# Patient Record
Sex: Female | Born: 1970 | Race: Black or African American | Hispanic: No | Smoking: Never smoker
Health system: Southern US, Community
[De-identification: ages and names within clinical notes are randomized; demographics above are authoritative.]

## PROBLEM LIST (undated history)

## (undated) DIAGNOSIS — E785 Hyperlipidemia, unspecified: Secondary | ICD-10-CM

## (undated) DIAGNOSIS — N920 Excessive and frequent menstruation with regular cycle: Principal | ICD-10-CM

## (undated) DIAGNOSIS — G10 Huntington's disease: Secondary | ICD-10-CM

## (undated) DIAGNOSIS — G4730 Sleep apnea, unspecified: Secondary | ICD-10-CM

## (undated) DIAGNOSIS — F419 Anxiety disorder, unspecified: Secondary | ICD-10-CM

## (undated) DIAGNOSIS — G3184 Mild cognitive impairment, so stated: Secondary | ICD-10-CM

## (undated) DIAGNOSIS — D219 Benign neoplasm of connective and other soft tissue, unspecified: Secondary | ICD-10-CM

## (undated) DIAGNOSIS — J3089 Other allergic rhinitis: Secondary | ICD-10-CM

## (undated) DIAGNOSIS — I10 Essential (primary) hypertension: Secondary | ICD-10-CM

## (undated) DIAGNOSIS — E669 Obesity, unspecified: Secondary | ICD-10-CM

## (undated) HISTORY — DX: Obesity, unspecified: E66.9

## (undated) HISTORY — DX: Excessive and frequent menstruation with regular cycle: N92.0

## (undated) HISTORY — DX: Anxiety disorder, unspecified: F41.9

## (undated) HISTORY — DX: Benign neoplasm of connective and other soft tissue, unspecified: D21.9

## (undated) HISTORY — DX: Essential (primary) hypertension: I10

## (undated) HISTORY — DX: Other allergic rhinitis: J30.89

## (undated) HISTORY — DX: Mild cognitive impairment of uncertain or unknown etiology: G31.84

## (undated) HISTORY — DX: Hyperlipidemia, unspecified: E78.5

---

## 2002-07-28 ENCOUNTER — Emergency Department (HOSPITAL_COMMUNITY): Admission: EM | Admit: 2002-07-28 | Discharge: 2002-07-29 | Payer: Self-pay | Admitting: Emergency Medicine

## 2006-01-02 ENCOUNTER — Encounter: Payer: Self-pay | Admitting: Family Medicine

## 2006-01-02 ENCOUNTER — Ambulatory Visit: Payer: Self-pay | Admitting: Family Medicine

## 2006-01-02 ENCOUNTER — Other Ambulatory Visit: Admission: RE | Admit: 2006-01-02 | Discharge: 2006-01-02 | Payer: Self-pay | Admitting: Family Medicine

## 2006-11-08 ENCOUNTER — Ambulatory Visit: Payer: Self-pay | Admitting: Family Medicine

## 2006-11-14 ENCOUNTER — Encounter: Payer: Self-pay | Admitting: Family Medicine

## 2006-11-14 LAB — CONVERTED CEMR LAB
Blood Glucose, Fasting: 96 mg/dL
Cholesterol: 175 mg/dL (ref 0–200)
Eosinophils Absolute: 0.1 10*3/uL (ref 0.0–0.7)
Glucose, Bld: 96 mg/dL (ref 70–99)
HDL: 39 mg/dL — ABNORMAL LOW (ref >39)
Lymphs Abs: 2.6 10*3/uL (ref 0.7–3.3)
MCV: 82.8 fL (ref 78.0–100.0)
Neutrophils Relative %: 50 % (ref 43–77)
Platelets: 309 10*3/uL (ref 150–400)
Potassium: 4.5 meq/L (ref 3.5–5.3)
Sodium: 138 meq/L (ref 135–145)
Total CHOL/HDL Ratio: 4.5
Triglycerides: 147 mg/dL (ref <150)
VLDL: 29 mg/dL (ref 0–40)
WBC: 6.4 10*3/uL (ref 4.0–10.5)

## 2006-12-04 ENCOUNTER — Ambulatory Visit (HOSPITAL_COMMUNITY): Admission: RE | Admit: 2006-12-04 | Discharge: 2006-12-04 | Payer: Self-pay | Admitting: Neurology

## 2006-12-11 ENCOUNTER — Ambulatory Visit: Payer: Self-pay | Admitting: Family Medicine

## 2007-01-17 ENCOUNTER — Encounter: Payer: Self-pay | Admitting: Family Medicine

## 2007-01-17 ENCOUNTER — Ambulatory Visit: Payer: Self-pay | Admitting: Family Medicine

## 2007-01-17 ENCOUNTER — Other Ambulatory Visit: Admission: RE | Admit: 2007-01-17 | Discharge: 2007-01-17 | Payer: Self-pay | Admitting: Family Medicine

## 2007-01-17 LAB — CONVERTED CEMR LAB: Pap Smear: NORMAL

## 2007-03-22 ENCOUNTER — Ambulatory Visit: Payer: Self-pay | Admitting: Family Medicine

## 2007-04-26 ENCOUNTER — Ambulatory Visit: Payer: Self-pay | Admitting: Family Medicine

## 2007-07-19 ENCOUNTER — Encounter: Payer: Self-pay | Admitting: Family Medicine

## 2007-07-19 DIAGNOSIS — G319 Degenerative disease of nervous system, unspecified: Secondary | ICD-10-CM

## 2007-07-19 DIAGNOSIS — J301 Allergic rhinitis due to pollen: Secondary | ICD-10-CM

## 2007-07-19 DIAGNOSIS — E785 Hyperlipidemia, unspecified: Secondary | ICD-10-CM

## 2007-07-19 DIAGNOSIS — I10 Essential (primary) hypertension: Secondary | ICD-10-CM

## 2007-07-19 DIAGNOSIS — E663 Overweight: Secondary | ICD-10-CM

## 2007-08-31 ENCOUNTER — Ambulatory Visit: Payer: Self-pay | Admitting: Family Medicine

## 2007-09-10 ENCOUNTER — Ambulatory Visit: Payer: Self-pay | Admitting: Family Medicine

## 2007-09-14 ENCOUNTER — Ambulatory Visit (HOSPITAL_COMMUNITY): Admission: RE | Admit: 2007-09-14 | Discharge: 2007-09-14 | Payer: Self-pay | Admitting: Family Medicine

## 2008-01-04 ENCOUNTER — Ambulatory Visit: Payer: Self-pay | Admitting: Family Medicine

## 2008-01-04 LAB — CONVERTED CEMR LAB
AST: 16 units/L (ref 0–37)
Alkaline Phosphatase: 50 units/L (ref 39–117)
Bilirubin, Direct: 0.1 mg/dL (ref 0.0–0.3)
CO2: 23 meq/L (ref 19–32)
Calcium: 9.3 mg/dL (ref 8.4–10.5)
Creatinine, Ser: 0.78 mg/dL (ref 0.40–1.20)
Eosinophils Relative: 2 % (ref 0–5)
Glucose, Bld: 106 mg/dL — ABNORMAL HIGH (ref 70–99)
HCT: 38.5 % (ref 36.0–46.0)
Hemoglobin: 11.7 g/dL — ABNORMAL LOW (ref 12.0–15.0)
LDL Cholesterol: 124 mg/dL — ABNORMAL HIGH (ref 0–99)
Lymphocytes Relative: 38 % (ref 12–46)
Lymphs Abs: 2.5 10*3/uL (ref 0.7–4.0)
Monocytes Absolute: 0.4 10*3/uL (ref 0.1–1.0)
Monocytes Relative: 5 % (ref 3–12)
RBC: 4.66 M/uL (ref 3.87–5.11)
TSH: 3.458 microintl units/mL (ref 0.350–4.50)
Total Bilirubin: 0.4 mg/dL (ref 0.3–1.2)
Total CHOL/HDL Ratio: 5.5
WBC: 6.6 10*3/uL (ref 4.0–10.5)

## 2008-02-19 ENCOUNTER — Ambulatory Visit: Payer: Self-pay | Admitting: Family Medicine

## 2008-02-19 ENCOUNTER — Encounter: Payer: Self-pay | Admitting: Family Medicine

## 2008-02-19 ENCOUNTER — Other Ambulatory Visit: Admission: RE | Admit: 2008-02-19 | Discharge: 2008-02-19 | Payer: Self-pay | Admitting: Family Medicine

## 2008-02-19 DIAGNOSIS — H547 Unspecified visual loss: Secondary | ICD-10-CM

## 2008-02-29 ENCOUNTER — Encounter: Payer: Self-pay | Admitting: Family Medicine

## 2008-05-13 ENCOUNTER — Encounter: Payer: Self-pay | Admitting: Family Medicine

## 2008-05-20 ENCOUNTER — Ambulatory Visit: Payer: Self-pay | Admitting: Family Medicine

## 2008-07-07 ENCOUNTER — Encounter: Payer: Self-pay | Admitting: Family Medicine

## 2008-07-30 ENCOUNTER — Ambulatory Visit: Payer: Self-pay | Admitting: Family Medicine

## 2008-07-30 DIAGNOSIS — M25559 Pain in unspecified hip: Secondary | ICD-10-CM

## 2008-09-30 ENCOUNTER — Encounter: Payer: Self-pay | Admitting: Family Medicine

## 2008-10-02 ENCOUNTER — Encounter: Payer: Self-pay | Admitting: Family Medicine

## 2008-10-16 ENCOUNTER — Encounter: Payer: Self-pay | Admitting: Family Medicine

## 2008-10-28 ENCOUNTER — Ambulatory Visit: Payer: Self-pay | Admitting: Family Medicine

## 2008-11-03 ENCOUNTER — Encounter: Payer: Self-pay | Admitting: Family Medicine

## 2009-02-03 ENCOUNTER — Encounter: Payer: Self-pay | Admitting: Family Medicine

## 2009-02-27 ENCOUNTER — Encounter: Payer: Self-pay | Admitting: Family Medicine

## 2009-02-27 LAB — CONVERTED CEMR LAB
CO2: 26 meq/L (ref 19–32)
Chloride: 102 meq/L (ref 96–112)
Creatinine, Ser: 0.88 mg/dL (ref 0.40–1.20)
HDL: 33 mg/dL — ABNORMAL LOW (ref >39)
LDL Cholesterol: 98 mg/dL (ref 0–99)
Potassium: 4.3 meq/L (ref 3.5–5.3)
TSH: 3.933 microintl units/mL (ref 0.350–4.500)
VLDL: 30 mg/dL (ref 0–40)

## 2009-03-03 ENCOUNTER — Ambulatory Visit: Payer: Self-pay | Admitting: Family Medicine

## 2009-03-03 DIAGNOSIS — E119 Type 2 diabetes mellitus without complications: Secondary | ICD-10-CM

## 2009-03-15 DIAGNOSIS — G10 Huntington's disease: Secondary | ICD-10-CM

## 2009-03-30 ENCOUNTER — Encounter: Payer: Self-pay | Admitting: Family Medicine

## 2009-04-21 ENCOUNTER — Ambulatory Visit: Payer: Self-pay | Admitting: Family Medicine

## 2009-04-21 DIAGNOSIS — N3000 Acute cystitis without hematuria: Secondary | ICD-10-CM | POA: Insufficient documentation

## 2009-04-21 LAB — CONVERTED CEMR LAB
Glucose, Urine, Semiquant: NEGATIVE
Nitrite: NEGATIVE
Protein, U semiquant: 30
Urobilinogen, UA: 0.2
WBC Urine, dipstick: NEGATIVE
pH: 5

## 2009-04-22 ENCOUNTER — Encounter: Payer: Self-pay | Admitting: Family Medicine

## 2009-04-22 LAB — CONVERTED CEMR LAB: Microalb Creat Ratio: 10.7 mg/g (ref 0.0–30.0)

## 2009-06-03 ENCOUNTER — Ambulatory Visit: Payer: Self-pay | Admitting: Family Medicine

## 2009-06-03 LAB — CONVERTED CEMR LAB
Chloride: 98 meq/L (ref 96–112)
Glucose, Bld: 195 mg/dL
Hgb A1c MFr Bld: 6.6 %
Potassium: 3.9 meq/L (ref 3.5–5.3)
Sodium: 137 meq/L (ref 135–145)

## 2009-06-05 ENCOUNTER — Telehealth: Payer: Self-pay | Admitting: Family Medicine

## 2009-07-03 ENCOUNTER — Encounter: Payer: Self-pay | Admitting: Family Medicine

## 2009-07-10 ENCOUNTER — Encounter: Payer: Self-pay | Admitting: Family Medicine

## 2009-08-07 ENCOUNTER — Ambulatory Visit: Payer: Self-pay | Admitting: Family Medicine

## 2009-08-07 ENCOUNTER — Encounter: Payer: Self-pay | Admitting: Physician Assistant

## 2009-08-31 ENCOUNTER — Telehealth: Payer: Self-pay | Admitting: Physician Assistant

## 2009-09-03 ENCOUNTER — Ambulatory Visit: Payer: Self-pay | Admitting: Orthopedic Surgery

## 2009-09-03 DIAGNOSIS — M76899 Other specified enthesopathies of unspecified lower limb, excluding foot: Secondary | ICD-10-CM | POA: Insufficient documentation

## 2009-09-03 DIAGNOSIS — M545 Low back pain, unspecified: Secondary | ICD-10-CM | POA: Insufficient documentation

## 2009-09-08 ENCOUNTER — Ambulatory Visit: Payer: Self-pay | Admitting: Family Medicine

## 2009-09-08 DIAGNOSIS — M25569 Pain in unspecified knee: Secondary | ICD-10-CM

## 2009-09-08 DIAGNOSIS — R51 Headache: Secondary | ICD-10-CM

## 2009-09-08 DIAGNOSIS — R519 Headache, unspecified: Secondary | ICD-10-CM | POA: Insufficient documentation

## 2009-09-14 ENCOUNTER — Encounter: Payer: Self-pay | Admitting: Physician Assistant

## 2009-09-14 LAB — CONVERTED CEMR LAB
Alkaline Phosphatase: 40 units/L (ref 39–117)
BUN: 15 mg/dL (ref 6–23)
CO2: 26 meq/L (ref 19–32)
Cholesterol: 176 mg/dL (ref 0–200)
Glucose, Bld: 94 mg/dL (ref 70–99)
HCT: 32.3 % — ABNORMAL LOW (ref 36.0–46.0)
HDL: 30 mg/dL — ABNORMAL LOW (ref >39)
Hemoglobin: 10.2 g/dL — ABNORMAL LOW (ref 12.0–15.0)
LDL Cholesterol: 118 mg/dL — ABNORMAL HIGH (ref 0–99)
MCHC: 31.6 g/dL (ref 30.0–36.0)
MCV: 83.9 fL (ref 78.0–100.0)
RBC: 3.85 M/uL — ABNORMAL LOW (ref 3.87–5.11)
Sodium: 137 meq/L (ref 135–145)
Total Bilirubin: 0.3 mg/dL (ref 0.3–1.2)
Total Protein: 7 g/dL (ref 6.0–8.3)
Triglycerides: 139 mg/dL (ref <150)
VLDL: 28 mg/dL (ref 0–40)
Vit D, 25-Hydroxy: 32 ng/mL (ref 30–89)
WBC: 8 10*3/uL (ref 4.0–10.5)

## 2009-09-15 LAB — CONVERTED CEMR LAB
Free T4: 1.11 ng/dL (ref 0.80–1.80)
Iron: 32 ug/dL — ABNORMAL LOW (ref 42–145)
Saturation Ratios: 10 % — ABNORMAL LOW (ref 20–55)
TIBC: 330 ug/dL (ref 250–470)

## 2009-09-16 ENCOUNTER — Encounter: Payer: Self-pay | Admitting: Orthopedic Surgery

## 2009-09-23 ENCOUNTER — Encounter (HOSPITAL_COMMUNITY): Admission: RE | Admit: 2009-09-23 | Discharge: 2009-10-23 | Payer: Self-pay | Admitting: Orthopedic Surgery

## 2009-10-16 LAB — CONVERTED CEMR LAB
Basophils Absolute: 0 10*3/uL (ref 0.0–0.1)
Basophils Relative: 1 % (ref 0–1)
Eosinophils Absolute: 0.6 10*3/uL (ref 0.0–0.7)
Eosinophils Relative: 9 % — ABNORMAL HIGH (ref 0–5)
HCT: 32.6 % — ABNORMAL LOW (ref 36.0–46.0)
MCV: 84.7 fL (ref 78.0–100.0)
Neutrophils Relative %: 47 % (ref 43–77)
Platelets: 354 10*3/uL (ref 150–400)
RDW: 14.7 % (ref 11.5–15.5)

## 2009-10-21 ENCOUNTER — Encounter: Payer: Self-pay | Admitting: Orthopedic Surgery

## 2009-10-26 ENCOUNTER — Encounter (HOSPITAL_COMMUNITY): Admission: RE | Admit: 2009-10-26 | Discharge: 2009-11-25 | Payer: Self-pay | Admitting: Orthopedic Surgery

## 2009-10-30 ENCOUNTER — Encounter: Payer: Self-pay | Admitting: Orthopedic Surgery

## 2009-11-02 ENCOUNTER — Telehealth: Payer: Self-pay | Admitting: Physician Assistant

## 2009-11-23 ENCOUNTER — Telehealth: Payer: Self-pay | Admitting: Physician Assistant

## 2009-12-15 ENCOUNTER — Ambulatory Visit: Admission: RE | Admit: 2009-12-15 | Discharge: 2009-12-15 | Payer: Self-pay | Admitting: Neurology

## 2009-12-25 ENCOUNTER — Encounter: Payer: Self-pay | Admitting: Family Medicine

## 2010-01-11 ENCOUNTER — Ambulatory Visit: Payer: Self-pay | Admitting: Family Medicine

## 2010-01-11 DIAGNOSIS — D509 Iron deficiency anemia, unspecified: Secondary | ICD-10-CM

## 2010-01-11 DIAGNOSIS — R5383 Other fatigue: Secondary | ICD-10-CM

## 2010-01-11 DIAGNOSIS — R5381 Other malaise: Secondary | ICD-10-CM

## 2010-01-11 DIAGNOSIS — L259 Unspecified contact dermatitis, unspecified cause: Secondary | ICD-10-CM | POA: Insufficient documentation

## 2010-01-12 LAB — CONVERTED CEMR LAB
Basophils Absolute: 0 10*3/uL (ref 0.0–0.1)
Basophils Relative: 1 % (ref 0–1)
Eosinophils Absolute: 0.5 10*3/uL (ref 0.0–0.7)
Ferritin: 14 ng/mL (ref 10–291)
Iron: 30 ug/dL — ABNORMAL LOW (ref 42–145)
MCHC: 30.8 g/dL (ref 30.0–36.0)
MCV: 85.6 fL (ref 78.0–100.0)
Monocytes Relative: 5 % (ref 3–12)
Neutrophils Relative %: 52 % (ref 43–77)
Platelets: 333 10*3/uL (ref 150–400)
RDW: 14.3 % (ref 11.5–15.5)

## 2010-01-14 ENCOUNTER — Telehealth: Payer: Self-pay | Admitting: Family Medicine

## 2010-01-20 ENCOUNTER — Encounter: Payer: Self-pay | Admitting: Family Medicine

## 2010-03-04 ENCOUNTER — Ambulatory Visit: Payer: Self-pay | Admitting: Family Medicine

## 2010-03-04 ENCOUNTER — Other Ambulatory Visit: Admission: RE | Admit: 2010-03-04 | Discharge: 2010-03-04 | Payer: Self-pay | Admitting: Family Medicine

## 2010-03-04 LAB — HM DIABETES FOOT EXAM

## 2010-03-04 LAB — HM PAP SMEAR

## 2010-03-09 ENCOUNTER — Encounter: Payer: Self-pay | Admitting: Family Medicine

## 2010-04-15 ENCOUNTER — Encounter: Payer: Self-pay | Admitting: Family Medicine

## 2010-05-05 ENCOUNTER — Ambulatory Visit
Admission: RE | Admit: 2010-05-05 | Discharge: 2010-05-05 | Payer: Self-pay | Source: Home / Self Care | Admitting: Psychiatry

## 2010-05-12 ENCOUNTER — Encounter: Payer: Self-pay | Admitting: Family Medicine

## 2010-06-02 ENCOUNTER — Ambulatory Visit: Payer: Self-pay | Admitting: Family Medicine

## 2010-06-02 LAB — CONVERTED CEMR LAB
BUN: 11 mg/dL (ref 6–23)
Cholesterol: 192 mg/dL (ref 0–200)
Creatinine, Ser: 1.02 mg/dL (ref 0.40–1.20)
Glucose, Bld: 83 mg/dL (ref 70–99)
Potassium: 4.4 meq/L (ref 3.5–5.3)
VLDL: 26 mg/dL (ref 0–40)

## 2010-06-04 ENCOUNTER — Telehealth: Payer: Self-pay | Admitting: Family Medicine

## 2010-07-02 ENCOUNTER — Ambulatory Visit (HOSPITAL_BASED_OUTPATIENT_CLINIC_OR_DEPARTMENT_OTHER)
Admission: RE | Admit: 2010-07-02 | Discharge: 2010-07-02 | Payer: Self-pay | Source: Home / Self Care | Attending: Internal Medicine | Admitting: Internal Medicine

## 2010-07-11 ENCOUNTER — Encounter: Payer: Self-pay | Admitting: Family Medicine

## 2010-07-12 ENCOUNTER — Encounter: Payer: Self-pay | Admitting: Family Medicine

## 2010-07-22 NOTE — Letter (Signed)
Summary: Historic Patient File  Historic Patient File   Imported By: Elvera Maria 09/04/2009 10:44:19  _____________________________________________________________________  External Attachment:    Type:   Image     Comment:   history form

## 2010-07-22 NOTE — Letter (Signed)
Summary: medical records  medical records   Imported By: Lind Guest 07/12/2010 08:39:49  _____________________________________________________________________  External Attachment:    Type:   Image     Comment:   External Document

## 2010-07-22 NOTE — Miscellaneous (Signed)
Summary: PT Clinical evaluation  PT Clinical evaluation   Imported By: Jacklynn Ganong 10/05/2009 14:07:28  _____________________________________________________________________  External Attachment:    Type:   Image     Comment:   External Document

## 2010-07-22 NOTE — Letter (Signed)
Summary: Letter  Letter   Imported By: Lind Guest 03/09/2010 11:26:00  _____________________________________________________________________  External Attachment:    Type:   Image     Comment:   External Document

## 2010-07-22 NOTE — Progress Notes (Signed)
  Phone Note Call from Patient   Summary of Call: Patient requesting refill on tramadol. [facility]. Last fill on 5/16 #30 1 q 6 hrs as needed  Initial call taken by: Everitt Amber LPN,  November 23, 9809 2:39 PM  Follow-up for Phone Call        Pt is suppose to be taking Naprosyn two times a day for her hip pain. If she is still having problems she needs to f/u with Dr Romeo Apple. Follow-up by: Esperanza Sheets PA,  November 23, 2009 2:53 PM  Additional Follow-up for Phone Call Additional follow up Details #1::        Called patient, left message Additional Follow-up by: Everitt Amber LPN,  November 23, 9145 4:25 PM    Additional Follow-up for Phone Call Additional follow up Details #2::    called pt, no answer Follow-up by: Everitt Amber LPN,  November 25, 8293 1:16 PM

## 2010-07-22 NOTE — Assessment & Plan Note (Signed)
Summary: office visit   Vital Signs:  Patient profile:   40 year old female Menstrual status:  regular Height:      65 inches Weight:      175.25 pounds BMI:     29.27 O2 Sat:      96 % Pulse rate:   99 / minute Pulse rhythm:   regular Resp:     16 per minute BP sitting:   100 / 76  (left arm) Cuff size:   large  Vitals Entered By: Everitt Amber LPN (January 11, 2010 9:35 AM)  Nutrition Counseling: Patient's BMI is greater than 25 and therefore counseled on weight management options. CC: nose has been running, has been having headaches   Primary Care Provider:  margaret simpson, md  CC:  nose has been running and has been having headaches.  History of Present Illness: Reports  that she has been doing fairly well. Denies recent fever or chills. Denies  ear pain or sore throat.She does report nasal congestion with clear nasal drainage and frontal headaches inrtermittently. Denies chest congestion, or cough productive of sputum. Denies chest pain, palpitations, PND, orthopnea or leg swelling. Denies abdominal pain, nausea, vomitting, diarrhea or constipation. Denies change in bowel movements or bloody stool. Denies dysuria , frequency, incontinence or hesitancy. Denies  joint swelling, or reduced mobility. Denies  vertigo, seizures. Denies uncontrolled depression, anxiety or insomnia.Meds control these symptoms Denies  rash, lesions, or itch.  Pt exercises every day, dancing, and she has her blood sugar log , she tests daily, her mom does this, her fasting sugars are bertween 90 to 120   Current Medications (verified): 1)  Ibuprofen 800 Mg  Tabs (Ibuprofen) .... Take 1 Tablet By Mouth Three Times A Day As Needed 2)  Cetirizine Hcl 10 Mg  Tabs (Cetirizine Hcl) .... Take 1 Tablet By Mouth Once A Day 3)  Hydrochlorothiazide 25 Mg Tabs (Hydrochlorothiazide) .... Take 1 Tablet By Mouth Once A Day 4)  Klor-Con M20 20 Meq Cr-Tabs (Potassium Chloride Crys Cr) .... Take 1 Tablet By  Mouth Once A Day 5)  Flonase 50 Mcg/act Susp (Fluticasone Propionate) .... 2 Puffs Per Nostril Daily 6)  Namenda 10 Mg Tabs (Memantine Hcl) .... Take 1 Tablet By Mouth Two Times A Day 7)  Citalopram Hydrobromide 20 Mg Tabs (Citalopram Hydrobromide) .... Take 1 Tablet By Mouth Once A Day 8)  Prodigy Meter .... Once Daily Testing 9)  Benazepril Hcl 10 Mg Tabs (Benazepril Hcl) .... Take 1 Tablet By Mouth Once A Day 10)  Galantamine Hydrobromide 24 Mg Xr24h-Cap (Galantamine Hydrobromide) .... One Tab By Mouth Qd 11)  Metformin Hcl 500 Mg Tabs (Metformin Hcl) .... Two Tablets Twice Daily 12)  Ferrous Sulfate 324 Mg Tbec (Ferrous Sulfate) .... Take 1 Tablet By Mouth Once A Day 13)  Naprosyn 500 Mg Tabs (Naproxen) .... Take 1 Tablet By Mouth Two Times A Day 14)  Seroquel 50 Mg Tabs (Quetiapine Fumarate) .... Take 1 Tab By Mouth At Bedtime 15)  Clotrimazole-Betamethasone 1-0.05 % Crea (Clotrimazole-Betamethasone) .... Apply To Affected Area Two Times A Day  Allergies (verified): 1)  ! Benadryl  Review of Systems      See HPI General:  Complains of fatigue. Eyes:  Denies discharge, eye pain, and red eye. ENT:  Complains of nasal congestion and postnasal drainage. MS:  Complains of joint pain; intermittent bilateral hip p[aion. Derm:  Complains of itching and lesion(s); several month h/o hyperpigmented lesions on both feet. Neuro:  Complains of  memory loss. Psych:  Complains of anxiety and depression; denies suicidal thoughts/plans, thoughts of violence, and unusual visions or sounds. Endo:  Denies cold intolerance, excessive hunger, excessive thirst, excessive urination, heat intolerance, polyuria, and weight change. Heme:  Denies abnormal bruising and bleeding. Allergy:  Complains of seasonal allergies.  Physical Exam  General:  Well-developed,well-nourished,in no acute distress; alert,appropriate and cooperative throughout examination HEENT: No facial asymmetry,  EOMI, No sinus tenderness,  TM's Clear, oropharynx  pink and moist.   Chest: Clear to auscultation bilaterally.  CVS: S1, S2, No murmurs, No S3.   Abd: Soft, Nontender.  MS: Adequate ROM spine, hips, shoulders and knees.  Ext: No edema.   CNS: CN 2-12 intact, power tone and sensation normal throughout.   Skin: Intact, hyperpigmented lesions on both lower ext,no erythema , warmth or drainage  Psych: Good eye contact, flat affect.  Memory losst, not anxious or depressed appearing.    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 Take 1 tablet by mouth once a day    Metformin Hcl 500 Mg Tabs (Metformin hcl) .Marland Kitchen..Marland Kitchen Two tablets twice daily  Orders: T- Hemoglobin A1C ([id number])  Labs Reviewed: Creat: 1.29 (09/10/2009)    Reviewed HgBA1c results: 6.5 (09/10/2009)  6.6 (06/03/2009)  Problem # 3:  HEADACHE (ICD-784.0) Assessment: Unchanged  The following medications were removed from the medication list:    Ibuprofen 800 Mg Tabs (Ibuprofen) .Marland Kitchen... Take 1 tablet by mouth three times a day as needed    Tramadol Hcl 50 Mg Tabs (Tramadol hcl) .Marland Kitchen... 1 q 6 hrs as needed pain Her updated medication list for this problem includes:    Naprosyn 500 Mg Tabs (Naproxen) .Marland Kitchen... Take 1 tablet by mouth two times a day  Problem # 4:  ESSENTIAL HYPERTENSION, BENIGN (ICD-401.1) Assessment: Unchanged  The following medications were removed from the medication list:    Hydrochlorothiazide 25 Mg Tabs (Hydrochlorothiazide) .Marland Kitchen... Take 1 tablet by mouth once a day Her updated medication list for this problem includes:    Benazepril Hcl 10 Mg Tabs (Benazepril hcl) .Marland Kitchen... Take 1 tablet by mouth once a day  BP today: 100/76 Prior BP: 102/68 (09/08/2009)  Labs  Reviewed: K+: 4.1 (09/10/2009) Creat: : 1.29 (09/10/2009)   Chol: 176 (09/10/2009)   HDL: 30 (09/10/2009)   LDL: 118 (09/10/2009)   TG: 139 (09/10/2009)  Problem # 5:  DERMATITIS (ICD-692.9) Assessment: Comment Only  The following medications were removed from the medication list:    Clobex 0.05 % Sham (Clobetasol propionate) ..... Use once per month to shampoo hair Her updated medication list for this problem includes:    Cetirizine Hcl 10 Mg Tabs (Cetirizine hcl) .Marland Kitchen... Take 1 tablet by mouth once a day  Orders: Dermatology Referral (Derma)  Complete Medication List: 1)  Cetirizine Hcl 10 Mg Tabs (Cetirizine hcl) .... Take 1 tablet by mouth once a day 2)  Flonase 50 Mcg/act Susp (Fluticasone propionate) .... 2 puffs per nostril daily 3)  Namenda 10 Mg Tabs (Memantine hcl) .... Take 1 tablet by mouth two times a day 4)  Citalopram Hydrobromide 20 Mg Tabs (Citalopram hydrobromide) .... Take 1 tablet by mouth once a day 5)  Prodigy Meter  .... Once daily testing 6)  Benazepril Hcl 10 Mg Tabs (  Benazepril hcl) .... Take 1 tablet by mouth once a day 7)  Galantamine Hydrobromide 24 Mg Xr24h-cap (Galantamine hydrobromide) .... One tab by mouth qd 8)  Metformin Hcl 500 Mg Tabs (Metformin hcl) .... Two tablets twice daily 9)  Ferrous Sulfate 324 Mg Tbec (Ferrous sulfate) .... Take 1 tablet by mouth once a day 10)  Naprosyn 500 Mg Tabs (Naproxen) .... Take 1 tablet by mouth two times a day 11)  Seroquel 50 Mg Tabs (Quetiapine fumarate) .... Take 1 tab by mouth at bedtime 12)  Clotrimazole-betamethasone 1-0.05 % Crea (Clotrimazole-betamethasone) .... Apply to affected area two times a day 13)  Multivitamins Tabs (Multiple vitamin) .... Take 1 tablet by mouth once a day 14)  Fish Oil 1000 Mg Caps (Omega-3 fatty acids) .... Take 1 tablet by mouth two times a day  Other Orders: T-CBC w/Diff ([id number]) T-Iron ([id number]) T-Ferritin ([id number])  Patient Instructions: 1)  CPE in 5 to 7  weeks 2)  It is important that you exercise regularly at least 20 minutes 5 times a week. If you develop chest pain, have severe difficulty breathing, or feel very tired , stop exercising immediately and seek medical attention. 3)  Check your blood sugars regularly. If your readings are usually above : or below 70 you should contact our office. 4)  It is important that you exercise regularly at least 20 minutes 5 times a week. If you develop chest pain, have severe difficulty breathing, or feel very tired , stop exercising immediately and seek medical attention. 5)  You need to lose weight. Consider a lower calorie diet and regular exercise.  6)  HbgA1C prior to visit, ICD-9:cbc , uiron and ferritin today if possible 7)  plsd only take the pain meds when you are hurting,not every day as this may be a part of the headache 8)  you will be referred to the skin specialist, also to [facility] program. 9)  pls discuss the headacheswith your neurologistt  10)  ppls stop hctz and potassium, your bP is low

## 2010-07-22 NOTE — Assessment & Plan Note (Signed)
Summary: follow up - room 1   Vital Signs:  Patient profile:   40 year old female Menstrual status:  regular Height:      65 inches Weight:      181.25 pounds BMI:     30.27 O2 Sat:      97 % on Room air Pulse rate:   82 / minute Resp:     16 per minute BP sitting:   102 / 68  (left arm)  Vitals Entered By: Adella Hare LPN (September 08, 2009 9:50 AM)  Nutrition Counseling: Patient's BMI is greater than 25 and therefore counseled on weight management options. CC: follow-up visit- also complains of right knee pain and headache Is Patient Diabetic? Yes   Referring Provider:  Dr. Lodema Hong Primary Provider:  margaret simpson, md  CC:  follow-up visit- also complains of right knee pain and headache.  History of Present Illness: Pt is here today with her mother for check up. She recetnly saw Dr Romeo Apple re: hip & knee pain.  X-rays were neg.  He changd her ibuprofen to Naprosyn & ordered PT.  She has not started PT yet.  Rt sided HA's x months.  Every night.  No change.  Ibuprofen is no help. Has Neurology appt Thurs.  Hx of DM. Blood sugars at home 100-110 fasting.  No episodes of hypoglycemia. Eye exam 1 wk ago at [facility]. Immun UTD  Also hx of HTN.  Taking meds.  Doesn't check BP at home.   Current Medications (verified): 1)  Ibuprofen 800 Mg  Tabs (Ibuprofen) .... Take 1 Tablet By Mouth Three Times A Day As Needed 2)  Cetirizine Hcl 10 Mg  Tabs (Cetirizine Hcl) .... Take 1 Tablet By Mouth Once A Day 3)  Hydrochlorothiazide 25 Mg Tabs (Hydrochlorothiazide) .... Take 1 Tablet By Mouth Once A Day 4)  Klor-Con M20 20 Meq Cr-Tabs (Potassium Chloride Crys Cr) .... Take 1 Tablet By Mouth Once A Day 5)  Astepro 137 Mcg/spray Soln (Azelastine Hcl) .... 2 Puffs Per Nostril Twice Daily 6)  Flonase 50 Mcg/act Susp (Fluticasone Propionate) .... 2 Puffs Per Nostril Daily 7)  Lunesta 1 Mg Tabs (Eszopiclone) .... Take 1-2 Tabs At Bedtime 8)  Namenda 10 Mg Tabs (Memantine Hcl) .... Take  1 Tablet By Mouth Two Times A Day 9)  Citalopram Hydrobromide 20 Mg Tabs (Citalopram Hydrobromide) .... Take 1 Tablet By Mouth Once A Day 10)  Prodigy Meter .... Once Daily Testing 11)  Benazepril Hcl 10 Mg Tabs (Benazepril Hcl) .... Take 1 Tablet By Mouth Once A Day 12)  Galantamine Hydrobromide 24 Mg Xr24h-Cap (Galantamine Hydrobromide) .... One Tab By Mouth Qd 13)  Metformin Hcl 500 Mg Tabs (Metformin Hcl) .... Two Tablets Twice Daily 14)  Clotrimazole-Betamethasone 1-0.05 % Crea (Clotrimazole-Betamethasone) .... Apply Twice Daily To Affected Areas 15)  Clobex 0.05 % Sham (Clobetasol Propionate) .... Use Once Per Month To Shampoo Hair 16)  Tramadol Hcl 50 Mg Tabs (Tramadol Hcl) .Marland Kitchen.. 1 Q 6 Hrs As Needed Pain  Allergies (verified): 1)  ! Benadryl  Past History:  Past medical history reviewed for relevance to current acute and chronic problems.  Past Medical History: Reviewed history from 09/03/2009 and no changes required. Hypertension Obesity Dyslipidemia Mild Cognitive Impairment Perennial Allergies, uncontrolled diabetes anxiety  Review of Systems General:  Denies chills and fever. Eyes:  Denies blurring and double vision. CV:  Denies chest pain or discomfort and palpitations. Resp:  Denies cough and shortness of breath. GI:  Denies nausea and vomiting. MS:  Complains of joint pain. Neuro:  Complains of headaches; denies numbness and tingling.  Physical Exam  General:  alert, well-developed, well-nourished, and well-hydrated.   Head:  Normocephalic and atraumatic without obvious abnormalities. No apparent alopecia or balding. Ears:  External ear exam shows no significant lesions or deformities.  Otoscopic examination reveals clear canals, tympanic membranes are intact bilaterally without bulging, retraction, inflammation or discharge. Hearing is grossly normal bilaterally. Nose:  External nasal examination shows no deformity or inflammation. Nasal mucosa are pink and  moist without lesions or exudates. Mouth:  Oral mucosa and oropharynx without lesions or exudates.   Neck:  No deformities, masses, or tenderness noted. Lungs:  Normal respiratory effort, chest expands symmetrically. Lungs are clear to auscultation, no crackles or wheezes. Heart:  Normal rate and regular rhythm. S1 and S2 normal without gallop, murmur, click, rub or other extra sounds. Abdomen:  Bowel sounds positive,abdomen soft and non-tender without masses, organomegaly or hernias noted. Msk:  Rt knee normal ROM, no joint tenderness, no joint swelling, no joint instability, and no crepitation.   Neurologic:  gait normal and DTRs symmetrical and normal.     Impression & Recommendations:  Problem # 1:  DIABETES MELLITUS (ICD-250.00) Assessment Improved  Her updated medication list for this problem includes:    Benazepril Hcl 10 Mg Tabs (Benazepril hcl) .Marland Kitchen... Take 1 tablet by mouth once a day    Metformin Hcl 500 Mg Tabs (Metformin hcl) .Marland Kitchen..Marland Kitchen Two tablets twice daily  Orders: T-Comprehensive Metabolic Panel [id number]) T- Hemoglobin A1C [phone])  Labs Reviewed: Creat: 0.96 (06/03/2009)    Reviewed HgBA1c results: 6.6 (06/03/2009)  7.2 (03/03/2009)  Problem # 2:  ESSENTIAL HYPERTENSION, BENIGN (ICD-401.1) Assessment: Unchanged  Her updated medication list for this problem includes:    Hydrochlorothiazide 25 Mg Tabs (Hydrochlorothiazide) .Marland Kitchen... Take 1 tablet by mouth once a day    Benazepril Hcl 10 Mg Tabs (Benazepril hcl) .Marland Kitchen... Take 1 tablet by mouth once a day  Orders: T-Comprehensive Metabolic Panel ([id number])  BP today: 102/68 Prior BP: 105/70 (08/07/2009)  Labs Reviewed: K+: 3.9 (06/03/2009) Creat: : 0.96 (06/03/2009)   Chol: 161 (02/27/2009)   HDL: 33 (02/27/2009)   LDL: 98 (02/27/2009)   TG: 148 (02/27/2009)  Problem # 3:  HEADACHE (ICD-784.0) Assessment: Comment Only Has appt with Neurology this week. Her updated medication list for this problem  includes:    Ibuprofen 800 Mg Tabs (Ibuprofen) .Marland Kitchen... Take 1 tablet by mouth three times a day as needed    Tramadol Hcl 50 Mg Tabs (Tramadol hcl) .Marland Kitchen... 1 q 6 hrs as needed pain  Problem # 4:  KNEE PAIN, RIGHT (ICD-719.46) Assessment: Comment Only  Her updated medication list for this problem includes:    Ibuprofen 800 Mg Tabs (Ibuprofen) .Marland Kitchen... Take 1 tablet by mouth three times a day as needed    Tramadol Hcl 50 Mg Tabs (Tramadol hcl) .Marland Kitchen... 1 q 6 hrs as needed pain  Complete Medication List: 1)  Ibuprofen 800 Mg Tabs (Ibuprofen) .... Take 1 tablet by mouth three times a day as needed 2)  Cetirizine Hcl 10 Mg Tabs (Cetirizine hcl) .... Take 1 tablet by mouth once a day 3)  Hydrochlorothiazide 25 Mg Tabs (Hydrochlorothiazide) .... Take 1 tablet by mouth once a day 4)  Klor-con M20 20 Meq Cr-tabs (Potassium chloride crys cr) .... Take 1 tablet by mouth once a day 5)  Astepro 137 Mcg/spray Soln (Azelastine hcl) .... 2 puffs per nostril twice daily  6)  Flonase 50 Mcg/act Susp (Fluticasone propionate) .... 2 puffs per nostril daily 7)  Lunesta 1 Mg Tabs (Eszopiclone) .... Take 1-2 tabs at bedtime 8)  Namenda 10 Mg Tabs (Memantine hcl) .... Take 1 tablet by mouth two times a day 9)  Citalopram Hydrobromide 20 Mg Tabs (Citalopram hydrobromide) .... Take 1 tablet by mouth once a day 10)  Prodigy Meter  .... Once daily testing 11)  Benazepril Hcl 10 Mg Tabs (Benazepril hcl) .... Take 1 tablet by mouth once a day 12)  Galantamine Hydrobromide 24 Mg Xr24h-cap (Galantamine hydrobromide) .... One tab by mouth qd 13)  Metformin Hcl 500 Mg Tabs (Metformin hcl) .... Two tablets twice daily 14)  Clotrimazole-betamethasone 1-0.05 % Crea (Clotrimazole-betamethasone) .... Apply twice daily to affected areas 15)  Clobex 0.05 % Sham (Clobetasol propionate) .... Use once per month to shampoo hair 16)  Tramadol Hcl 50 Mg Tabs (Tramadol hcl) .Marland Kitchen.. 1 q 6 hrs as needed pain  Other Orders: T-Lipid Profile  ([id number]) T-CBC No Diff ([id number]) T-TSH ([id number]) T-Vitamin D (25-Hydroxy) ([id number])  Patient Instructions: 1)  Please schedule a follow-up appointment in 4 months. 2)  It is important that you exercise regularly at least 20 minutes 5 times a week. If you develop chest pain, have severe difficulty breathing, or feel very tired , stop exercising immediately and seek medical attention. 3)  You need to lose weight. Consider a lower calorie diet and regular exercise.  4)  Keep appt with Neurologist this week to discuss your headaches. 5)  Continue medications as recommended by Dr. Romeo Apple.  Start physical therapy as he recommended too. 6)  I have ordered lab work.  This needs to be drawn fasting. Prescriptions: KLOR-CON M20 20 MEQ CR-TABS (POTASSIUM CHLORIDE CRYS CR) Take 1 tablet by mouth once a day  #30 x 4   Entered and Authorized by:   Esperanza Sheets PA   Signed by:   Esperanza Sheets PA on 09/08/2009   Method used:   Electronically to        [facility] (retail)       [address]       [place], [place]  [address]       Ph: [phone] or [phone]       Fax: [phone]   RxID:   [id number]

## 2010-07-22 NOTE — Progress Notes (Signed)
Summary: eye exam  eye exam   Imported By: Lind Guest 07/10/2009 10:59:31  _____________________________________________________________________  External Attachment:    Type:   Image     Comment:   External Document

## 2010-07-22 NOTE — Assessment & Plan Note (Signed)
Summary: FOLLOW UP/SLJ   Vital Signs:  Patient profile:   40 year old female Menstrual status:  regular Height:      65 inches Weight:      175.25 pounds BMI:     29.27 O2 Sat:      98 % on Room air Pulse rate:   70 / minute Pulse rhythm:   regular Resp:     16 per minute BP sitting:   106 / 70  (left arm)  Vitals Entered By: Adella Hare LPN (June 02, 2010 3:16 PM)  Nutrition Counseling: Patient's BMI is greater than 25 and therefore counseled on weight management options.  O2 Flow:  Room air CC: follow-up visit Is Patient Diabetic? Yes Did you bring your meter with you today? No Pain Assessment Patient in pain? no      Comments did not bring meds to ov but mother states no changes   Primary Care Provider:  margaret simpson, md  CC:  follow-up visit.  History of Present Illness: Reports  that she has ben doing fairly well. Denies recent fever or chills. Denies sinus pressure, nasal congestion , ear pain or sore throat. Denies chest congestion, or cough productive of sputum. Denies chest pain, palpitations, PND, orthopnea or leg swelling. Denies abdominal pain, nausea, vomitting, diarrhea or constipation. Denies change in bowel movements or bloody stool. Denies dysuria , frequency, incontinence or hesitancy. Denies  joint pain, swelling, or reduced mobility. Denies headaches, vertigo, seizures. Denies depression, anxiety or insomnia. Denies  rash, lesions, or itch.     Allergies (verified): 1)  ! Benadryl  Review of Systems      See HPI General:  Complains of fatigue. Eyes:  Denies blurring and discharge. Endo:  Denies cold intolerance, excessive hunger, excessive thirst, and excessive urination. Heme:  Denies abnormal bruising, bleeding, enlarge lymph nodes, and fevers. Allergy:  Complains of seasonal allergies.  Physical Exam  General:  Well-developed,well-nourished,in no acute distress; alert,appropriate and cooperative throughout  examination HEENT: No facial asymmetry,  EOMI, No sinus tenderness, TM's Clear, oropharynx  pink and moist.   Chest: Clear to auscultation bilaterally.  CVS: S1, S2, No murmurs, No S3.   Abd: Soft, Nontender.  MS: Adequate ROM spine, hips, shoulders and knees.  Ext: No edema.   CNS: CN 2-12 intact, power tone and sensation normal throughout.   Skin: Intact, no visible lesions or rashes.  Psych: Good eye contact, normal affect.  Memory intact, not anxious or depressed appearing.    Impression & Recommendations:  Problem # 1:  ESSENTIAL HYPERTENSION, BENIGN (ICD-401.1) Assessment Unchanged  Her updated medication list for this problem includes:    Benazepril Hcl 10 Mg Tabs (Benazepril hcl) .Marland Kitchen... Take 1 tablet by mouth once a day  Orders: T-Basic Metabolic Panel [phone])  BP today: 106/70 Prior BP: 110/70 (03/04/2010)  Labs Reviewed: K+: 4.4 (05/31/2010) Creat: : 1.02 (05/31/2010)   Chol: 192 (05/31/2010)   HDL: 37 (05/31/2010)   LDL: 129 (05/31/2010)   TG: 132 (05/31/2010)  Problem # 2:  DIABETES MELLITUS (ICD-250.00) Assessment: Unchanged  The following medications were removed from the medication list:    Metformin Hcl 500 Mg Tabs (Metformin hcl) .Marland Kitchen..Marland Kitchen Two tablets twice daily Her updated medication list for this problem includes:    Benazepril Hcl 10 Mg Tabs (Benazepril hcl) .Marland Kitchen... Take 1 tablet by mouth once a day    Metformin Hcl 500 Mg Tabs (Metformin hcl) .Marland Kitchen... Take 1 tablet by mouth two times a day  Labs Reviewed:  Creat: 1.02 (05/31/2010)    Reviewed HgBA1c results: 6.2 (05/31/2010)  5.9 (01/11/2010)  Orders: T-Urine Microalbumin w/creat. ratio [phone]) T- Hemoglobin A1C ([id number])  Problem # 3:  DYSLIPIDEMIA (ICD-272.4) Assessment: Deteriorated  Labs Reviewed: SGOT: 15 (09/10/2009)   SGPT: 17 (09/10/2009)   HDL:37 (05/31/2010), 30 (09/10/2009)  LDL:129 (05/31/2010), 118 (09/10/2009)  Chol:192 (05/31/2010), 176 (09/10/2009)  Trig:132  (05/31/2010), 139 (09/10/2009) pt to start lovastatin. 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 Take 1 tablet by mouth once a day 2)  Flonase 50 Mcg/act Susp (Fluticasone propionate) .... 2 puffs per nostril daily 3)  Namenda 10 Mg Tabs (Memantine hcl) .... Take 1 tablet by mouth two times a day 4)  Citalopram Hydrobromide 20 Mg Tabs (Citalopram hydrobromide) .... Take 1 tablet by mouth once a day 5)  Prodigy Meter  .... Once daily testing 6)  Benazepril Hcl 10 Mg Tabs (Benazepril hcl) .... Take 1 tablet by mouth once a day 7)  Galantamine Hydrobromide 24 Mg Xr24h-cap (Galantamine hydrobromide) .... One tab by mouth qd 8)  Ferrous Sulfate 324 Mg Tbec (Ferrous sulfate) .... Take 1 tablet by mouth once a day 9)  Naprosyn 500 Mg Tabs (Naproxen) .... Take 1 tablet by mouth two times a day 10)  Seroquel 50 Mg Tabs (Quetiapine fumarate) .... Take 1 tab by mouth at bedtime 11)  Clotrimazole-betamethasone 1-0.05 % Crea (Clotrimazole-betamethasone) .... Apply to affected area two times a day 12)  Multivitamins Tabs (Multiple vitamin) .... Take 1 tablet by mouth once a day 13)  Fish Oil 1000 Mg Caps (Omega-3 fatty acids) .... Take 1 tablet by mouth two times a day 14)  Lovastatin 20 Mg Tabs (Lovastatin) .... One tab by mouth at bedtime 15)  Metformin Hcl 500 Mg Tabs (Metformin hcl) .... Take 1 tablet by mouth two times a day  Other Orders: T-Lipid Profile ([id number]) T-Hepatic Function [id number]) Medicare Electronic Prescription [id number])  Patient Instructions: 1)  Please schedule a follow-up appointment in 3 months. 2)  It is important that you exercise regularly at least 30 minutes 5 times 3a week. If you develop chest pain, have severe difficulty breathing, or feel  very tired , stop exercising immediately and seek medical attention. 3)  You need to lose weight. Consider a lower calorie diet and regular exercise.  4)  BMP prior to visit, ICD-9: 5)  Hepatic Panel prior to visit, ICD-9: 6)  Lipid Panel prior to visit, ICD-9:  fasting in 3 months 7)  HbgA1C prior to visit, ICD-9: 8)  Microalbumin today. Prescriptions: LOVASTATIN 40 MG TABS (LOVASTATIN) Take 1 tab by mouth at bedtime  #30 x 3   Entered and Authorized by:   Syliva Overman MD   Signed by:   Syliva Overman MD on 06/02/2010   Method used:   Electronically to        [facility]* (retail)       [address]       [place], [place]  [address]       Ph: [phone] or [phone]       Fax: [phone]   RxID:   [id number] METFORMIN HCL 500 MG TABS (METFORMIN HCL) Take 1 tablet by mouth two times a day  #60 x 4   Entered  and Authorized by:   Syliva Overman MD   Signed by:   Syliva Overman MD on 06/02/2010   Method used:   Printed then faxed to ...       [facility]* (retail)       [address]       [place], [place]  [address]       Ph: [phone] or [phone]       Fax: [phone]   RxID:   [id number]    Orders Added: 1)  Est. Patient Level IV [[address]] 2)  T-Urine Microalbumin w/creat. ratio [[id number]] 3)  T-Basic Metabolic Panel [80048-22910] 4)  T-Lipid Profile [80061-22930] 5)  T-Hepatic Function [80076-22960] 6)  T- Hemoglobin A1C [83036-23375] 7)  Medicare Electronic Prescription [phone]

## 2010-07-22 NOTE — Miscellaneous (Signed)
Summary: PT Progress note  PT Progress note   Imported By: Jacklynn Ganong 10/26/2009 10:07:12  _____________________________________________________________________  External Attachment:    Type:   Image     Comment:   External Document

## 2010-07-22 NOTE — Miscellaneous (Signed)
Summary: refill  Clinical Lists Changes  Medications: Rx of CETIRIZINE HCL 10 MG  TABS (CETIRIZINE HCL) Take 1 tablet by mouth once a day;  #30 x 4;  Signed;  Entered by: Everitt Amber;  Authorized by: Syliva Overman MD;  Method used: Electronically to [facility] [address] [place], [place], [place]  [address], Ph: [phone] or [phone], Fax: [phone] Rx of HYDROCHLOROTHIAZIDE 25 MG TABS (HYDROCHLOROTHIAZIDE) Take 1 tablet by mouth once a day;  #30 x 4;  Signed;  Entered by: Everitt Amber;  Authorized by: Syliva Overman MD;  Method used: Electronically to [facility]*, [address], [place], [place], [place]  [address], Ph: [phone] or [phone], Fax: [phone]    Prescriptions: HYDROCHLOROTHIAZIDE 25 MG TABS (HYDROCHLOROTHIAZIDE) Take 1 tablet by mouth once a day  #30 x 4   Entered by:   Everitt Amber   Authorized by:   Syliva Overman MD   Signed by:   Everitt Amber on 07/03/2009   Method used:   Electronically to        [facility]* (retail)       [address]       [place], [place]  [address]       Ph: [phone] or [phone]       Fax: [phone]   RxID:   [id number] CETIRIZINE HCL 10 MG  TABS (CETIRIZINE HCL) Take 1 tablet by mouth once a day  #30 x 4   Entered by:   Everitt Amber   Authorized by:   Syliva Overman MD   Signed by:   Everitt Amber on 07/03/2009   Method used:   Electronically to        [facility]* (retail)       [address]       [place], [place]  [address]       Ph: [phone] or [phone]       Fax: [phone]   RxID:   [id number]

## 2010-07-22 NOTE — Assessment & Plan Note (Signed)
Summary: bi hip pain needs xr/medicare/medicaid/sampson/bsf   Vital Signs:  Patient profile:   40 year old female Menstrual status:  regular Height:      65 inches Weight:      179 pounds Pulse rate:   70 / minute Resp:     16 per minute  Vitals Entered By: Fuller Canada MD (September 03, 2009 10:18 AM)  Visit Type:  Initial Consult Referring Provider:  Dr. Lodema Hong Primary Provider:  margaret simpson, md  CC:  hip pain.  History of Present Illness: at 40 year old female with bilateral hip pain and pelvic pain for 2 years no injury.  She carries a diagnosis of Huntington's disease.  She also complains of some back and groin pain which is 9/10, stabbing, constant came on gradually unrelieved by ibuprofen and tramadol.  She's been on ibuprofen approximately a year and tramadol for one week  Needs xrays.  Meds: Ibuprofen, Ceterizine, HCTZ, Tramadol, Klor Kon, Lunesta, Namenda, Clotrimazole, Citalopram, Benazepril, Galantamine, Metformin.    Allergies: 1)  ! Benadryl  Past History:  Past Medical History: Hypertension Obesity Dyslipidemia Mild Cognitive Impairment Perennial Allergies, uncontrolled diabetes anxiety  Family History: Mother- Healthy-suffers from hydradenitis Father- deceased- Huntingtons Disease Brother (half) healthy FH of Cancer:  Family History of Diabetes Family History Coronary Heart Disease female < 8  Social History: Divorced disabled Never Smoked Alcohol use-no Drug use-no 1 son , healthy no caffeine use  Review of Systems Constitutional:  Denies weight loss, weight gain, fever, chills, and fatigue. Cardiovascular:  Denies chest pain, palpitations, fainting, and murmurs. Respiratory:  Complains of snoring; denies short of breath, wheezing, couch, tightness, pain on inspiration, and snoring . Gastrointestinal:  Complains of constipation; denies heartburn, nausea, vomiting, diarrhea, and blood in your stools. Genitourinary:  Denies  frequency, urgency, difficulty urinating, painful urination, flank pain, and bleeding in urine. Neurologic:  Denies numbness, tingling, unsteady gait, dizziness, tremors, and seizure. Musculoskeletal:  Complains of joint pain; denies swelling, instability, stiffness, redness, heat, and muscle pain. Endocrine:  Denies excessive thirst, exessive urination, and heat or cold intolerance. Psychiatric:  Complains of nervousness and anxiety; denies depression and hallucinations. Skin:  Complains of itching; denies changes in the skin, poor healing, rash, and redness. HEENT:  Complains of watering; denies blurred or double vision, eye pain, and redness; headaches. Immunology:  Denies seasonal allergies, sinus problems, and allergic to bee stings. Hemoatologic:  Denies easy bleeding and brusing.  Physical Exam  Additional Exam:  her vital signs were stable.  She had no developmental abnormalities.  She had normal distal pulses in her extremities are warm to touch without edema or swelling  There is no lymphadenopathy  Her skin was dry she had calluses over her knees she had no caf au lait spots and no rashes  She had good reflexes 2+ in each knee and each ankle she is oriented to time person and place she was awake and alert her mood was pleasant  Her ambulation is normal  She had a straight spine nontender spine her back in 90 of flexion.  The pelvic heights are equal  She had normal range of motion in both hips.  Both hips are stable without laxity.  Both lower extremities had normal strength.  She did have some tenderness over each greater trochanter.     Impression & Recommendations:  Problem # 1:  BURSITIS, HIP (ICD-726.5) Assessment New  bilateral hip bursitis.  I find no evidence of surgical disease.  I would change her to Naprosyn  500 b.i.d. start some physical therapy and see how she does with that.  Her x-rays which included a pelvic x-ray and a back x-ray were  normal  X-ray reports  Pelvic x-ray ordered to assess both hips and back x-ray 3 views order to assess lumbar spine  The bony anatomy is normal the hip joints are clean the leg lengths are equal  The lumbar spine shows just slight flattening of the lumbar vertebrae but otherwise normal with normal disc spaces and no facet arthritis  Impression normal pelvic x-ray normal hip x-ray  Orders: New Patient Level III (04540) Lumbosacral Spine ,2/3 views (72100) Pelvis x-ray, 1/2 views (98119)  Other Orders: Physical Therapy Referral (PT)  Patient Instructions: 1)  set up physical therapy for greater trochanteric bursitis / tendonitis  2)  chnge ibuprofen to naprosyn 500 mg two times a day  3)  f/u with your doctor as needed

## 2010-07-22 NOTE — Letter (Signed)
Summary: diabetic supplies  diabetic supplies   Imported By: Lind Guest 12/25/2009 14:44:23  _____________________________________________________________________  External Attachment:    Type:   Image     Comment:   External Document

## 2010-07-22 NOTE — Letter (Signed)
Summary: [facility]   Imported By: Lind Guest 04/19/2010 10:04:55  _____________________________________________________________________  External Attachment:    Type:   Image     Comment:   External Document

## 2010-07-22 NOTE — Assessment & Plan Note (Signed)
Summary: PHYSICAL   Vital Signs:  Patient profile:   40 year old female Menstrual status:  regular Height:      65 inches Weight:      176.75 pounds BMI:     29.52 O2 Sat:      98 % on Room air Pulse rate:   82 / minute Pulse rhythm:   regular Resp:     16 per minute BP sitting:   110 / 70  (left arm)  Vitals Entered By: Adella Hare LPN (March 04, 2010 9:18 AM)  Nutrition Counseling: Patient's BMI is greater than 25 and therefore counseled on weight management options.  O2 Flow:  Room air CC: physical Is Patient Diabetic? Yes Did you bring your meter with you today? No Pain Assessment Patient in pain? no      Comments did not bring meds to ov  Vision Screening:Left eye w/o correction: 20 / 50 Right Eye w/o correction: 20 / 70 Both eyes w/o correction:  20/ 50  Color vision testing: red/green color blind      Vision Entered By: Adella Hare LPN (March 04, 2010 9:20 AM)   Primary Care Provider:  margaret simpson, md  CC:  physical.  History of Present Illness: Reports  that she has been  doing well. Denies recent fever or chills. Denies sinus pressure, nasal congestion , ear pain or sore throat. Denies chest congestion, or cough productive of sputum. Denies chest pain, palpitations, PND, orthopnea or leg swelling. Denies abdominal pain, nausea, vomitting, diarrhea or constipation. Denies change in bowel movements or bloody stool. Denies dysuria , frequency, incontinence or hesitancy. Denies  joint pain, swelling, or reduced mobility. Denies headaches, vertigo, seizures. Denies depression, anxiety or insomnia. Denies  rash, lesions, or itch. She tests her sugars daily, and the fasting sugars are seldom over 100, she does have her log as usual.     Allergies (verified): 1)  ! Benadryl  Review of Systems      See HPI General:  Complains of fatigue. Eyes:  Complains of vision loss-both eyes. Neuro:  Complains of memory loss; denies seizures and  sensation of room spinning. Psych:  Complains of mental problems; memory loss due to huntington's disease. Endo:  Denies cold intolerance, excessive thirst, excessive urination, and heat intolerance. Heme:  Denies abnormal bruising and bleeding. Allergy:  Complains of seasonal allergies.  Physical Exam  General:  Well-developed,well-nourished,in no acute distress; alert,appropriate and cooperative throughout examination Head:  Normocephalic and atraumatic without obvious abnormalities. No apparent alopecia or balding. Eyes:  vision grossly intact, pupils equal, and pupils round.   Ears:  External ear exam shows no significant lesions or deformities.  Otoscopic examination reveals clear canals, tympanic membranes are intact bilaterally without bulging, retraction, inflammation or discharge. Hearing is grossly normal bilaterally. Nose:  External nasal examination shows no deformity or inflammation. Nasal mucosa are pink and moist without lesions or exudates. Mouth:  pharynx pink and moist and fair dentition.   Neck:  No deformities, masses, or tenderness noted. Chest Wall:  No deformities, masses, or tenderness noted. Breasts:  No mass, nodules, thickening, tenderness, bulging, retraction, inflamation, nipple discharge or skin changes noted.   Lungs:  Normal respiratory effort, chest expands symmetrically. Lungs are clear to auscultation, no crackles or wheezes. Heart:  Normal rate and regular rhythm. S1 and S2 normal without gallop, murmur, click, rub or other extra sounds. Abdomen:  Bowel sounds positive,abdomen soft and non-tender without masses, organomegaly or hernias noted. Genitalia:  Normal introitus  for age, no external lesions, no vaginal discharge, mucosa pink and moist, no vaginal or cervical lesions, no vaginal atrophy, no friaility or hemorrhage, normal uterus size and position, no adnexal masses or tenderness Msk:  No deformity or scoliosis noted of thoracic or lumbar spine.     Pulses:  R and L carotid,radial,femoral,dorsalis pedis and posterior tibial pulses are full and equal bilaterally Extremities:  No clubbing, cyanosis, edema, or deformity noted with normal full range of motion of all joints.   Neurologic:  No cranial nerve deficits noted. Station and gait are normal. Plantar reflexes are down-going bilaterally. DTRs are symmetrical throughout. Sensory, motor and coordinative functions appear intact. Skin:  Intact without suspicious lesions or rashes Cervical Nodes:  No lymphadenopathy noted Axillary Nodes:  No palpable lymphadenopathy Inguinal Nodes:  No significant adenopathy Psych:  Oriented X3, good eye contact, not anxious appearing, and not depressed appearing.  Memory loss and unablke to independently provide ahistory  Diabetes Management Exam:    Foot Exam (with socks and/or shoes not present):       Sensory-Monofilament:          Left foot: normal          Right foot: normal       Inspection:          Left foot: normal          Right foot: normal       Nails:          Left foot: normal          Right foot: normal   Impression & Recommendations:  Problem # 1:  UNSPECIFIED VISUAL LOSS (ICD-369.9) Assessment Comment Only  Orders: Ophthalmology Referral (Ophthalmology)  Problem # 2:  DIABETES MELLITUS (ICD-250.00) Assessment: Improved  Her updated medication list for this problem includes:    Benazepril Hcl 10 Mg Tabs (Benazepril hcl) .Marland Kitchen... Take 1 tablet by mouth once a day    Metformin Hcl 500 Mg Tabs (Metformin hcl) .Marland Kitchen..Marland Kitchen Two tablets twice daily  Orders: T- Hemoglobin A1C ([id number]) Ophthalmology Referral (Ophthalmology)  Labs Reviewed: Creat: 1.29 (09/10/2009)    Reviewed HgBA1c results: 5.9 (01/11/2010)  6.5 (09/10/2009)  Problem # 3:  OBESITY, UNSPECIFIED (ICD-278.00) Assessment: Improved  Ht: 65 (03/04/2010)   Wt: 176.75 (03/04/2010)   BMI: 29.52 (03/04/2010)  Problem # 4:  ESSENTIAL HYPERTENSION, BENIGN  (ICD-401.1) Assessment: Unchanged  Her updated medication list for this problem includes:    Benazepril Hcl 10 Mg Tabs (Benazepril hcl) .Marland Kitchen... 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 Pt to [facility]scince clinic at [facility]  Problem # 6:  DYSLIPIDEMIA (ICD-272.4) Assessment: Comment Only  Orders: T-Lipid Profile [id number])  Labs Reviewed: SGOT: 15 (09/10/2009)   SGPT: 17 (09/10/2009)   HDL:30 (09/10/2009), 33 (02/27/2009)  LDL:118 (09/10/2009), 98 (53/66/4403)  Chol:176 (09/10/2009), 161 (02/27/2009)  Trig:139 (09/10/2009), 148 (02/27/2009) Low fat dietdiscussed and encouraged  Complete Medication List: 1)  Cetirizine Hcl 10 Mg Tabs (Cetirizine hcl) .... Take 1 tablet by mouth once a day 2)  Flonase 50 Mcg/act Susp (Fluticasone propionate) .... 2 puffs per nostril daily 3)  Namenda 10 Mg Tabs (Memantine  hcl) .... Take 1 tablet by mouth two times a day 4)  Citalopram Hydrobromide 20 Mg Tabs (Citalopram hydrobromide) .... Take 1 tablet by mouth once a day 5)  Prodigy Meter  .... Once daily testing 6)  Benazepril Hcl 10 Mg Tabs (Benazepril hcl) .... Take 1 tablet by mouth once a day 7)  Galantamine Hydrobromide 24 Mg Xr24h-cap (Galantamine hydrobromide) .... One tab by mouth qd 8)  Metformin Hcl 500 Mg Tabs (Metformin hcl) .... Two tablets twice daily 9)  Ferrous Sulfate 324 Mg Tbec (Ferrous sulfate) .... Take 1 tablet by mouth once a day 10)  Naprosyn 500 Mg Tabs (Naproxen) .... Take 1 tablet by mouth two times a day 11)  Seroquel 50 Mg Tabs (Quetiapine fumarate) .... Take 1 tab by mouth at bedtime 12)   Clotrimazole-betamethasone 1-0.05 % Crea (Clotrimazole-betamethasone) .... Apply to affected area two times a day 13)  Multivitamins Tabs (Multiple vitamin) .... Take 1 tablet by mouth once a day 14)  Fish Oil 1000 Mg Caps (Omega-3 fatty acids) .... Take 1 tablet by mouth two times a day  Other Orders: Influenza Vaccine MCR ([address]) Pap Smear ([address])  Patient Instructions: 1)  Please schedule a follow-up appointment in 2 months. 2)  It is important that you exercise regularly at least 20 minutes 5 times a week. If you develop chest pain, have severe difficulty breathing, or feel very tired , stop exercising immediately and seek medical attention. 3)  You need to lose weight. Consider a lower calorie diet and regular exercise.  4)  rEDUCE the metformin to oNE twice daily 5)  BMP prior to visit, ICD-9: 6)  Lipid Panel prior to visit, ICD-9:   fasting in 2 months. 7)  You will be referred for an eye exam 8)  HbgA1C prior to visit, ICD-9: Prescriptions: FLONASE 50 MCG/ACT SUSP (FLUTICASONE PROPIONATE) 2 puffs per nostril daILY  #1 x 3   Entered by:   Adella Hare LPN   Authorized by:   Syliva Overman MD   Signed by:   Adella Hare LPN on 09/81/1914   Method used:   Electronically to        [facility]* (retail)       [address]       [place], [place]  [address]       Ph: [phone] or [phone]       Fax: [phone]   RxID:   [id number]    Influenza Vaccine    Vaccine Type: Fluvax MCR    Site: right deltoid    Mfr: novartis    Dose: 0.5 ml    Route: IM    Given by: Adella Hare LPN    Exp. Date: 10/2010    Lot #: [id number]    VIS given: 01/12/10 version given March 04, 2010.

## 2010-07-22 NOTE — Assessment & Plan Note (Signed)
Summary: follow up- room 1   Vital Signs:  Patient profile:   40 year old female Menstrual status:  regular Height:      62.5 inches Weight:      182.75 pounds BMI:     33.01 O2 Sat:      99 % Pulse rate:   76 / minute Resp:     16 per minute BP sitting:   105 / 70  (left arm)  Vitals Entered By: Adella Hare LPN (August 07, 2009 11:04 AM) CC: follow-up visit Is Patient Diabetic? Yes Pain Assessment Patient in pain? yes     Location: legs and hips Intensity: 10 Type: aching Onset of pain  Constant   Primary Provider:  margaret simpson, md  CC:  follow-up visit.  History of Present Illness: Bilat hip pain x yrs.  Is worsening.  Pain worse with standing/walking, and with lying on side.  Is taking Ibuprofen 800 mg two times a day & states is not helping.  No trauma. Pt states that she is not sleeping well due to the pain.  Also has Huningtons.  Is seeing neurologist.  Also has been working on losing weight.  Weight is down 8 # today from prev visit.   Current Medications (verified): 1)  Ibuprofen 800 Mg  Tabs (Ibuprofen) .... Take 1 Tablet By Mouth Three Times A Day As Needed 2)  Cetirizine Hcl 10 Mg  Tabs (Cetirizine Hcl) .... Take 1 Tablet By Mouth Once A Day 3)  Hydrochlorothiazide 25 Mg Tabs (Hydrochlorothiazide) .... Take 1 Tablet By Mouth Once A Day 4)  Klor-Con M20 20 Meq Cr-Tabs (Potassium Chloride Crys Cr) .... Take 1 Tablet By Mouth Once A Day 5)  Astepro 137 Mcg/spray Soln (Azelastine Hcl) .... 2 Puffs Per Nostril Twice Daily 6)  Flonase 50 Mcg/act Susp (Fluticasone Propionate) .... 2 Puffs Per Nostril Daily 7)  Lunesta 1 Mg Tabs (Eszopiclone) .... Take 1-2 Tabs At Bedtime 8)  Namenda 10 Mg Tabs (Memantine Hcl) .... Take 1 Tablet By Mouth Two Times A Day 9)  Citalopram Hydrobromide 20 Mg Tabs (Citalopram Hydrobromide) .... Take 1 Tablet By Mouth Once A Day 10)  Prodigy Meter .... Once Daily Testing 11)  Benazepril Hcl 10 Mg Tabs (Benazepril Hcl) .... Take 1  Tablet By Mouth Once A Day 12)  Galantamine Hydrobromide 24 Mg Xr24h-Cap (Galantamine Hydrobromide) .... One Tab By Mouth Qd 13)  Metformin Hcl 500 Mg Tabs (Metformin Hcl) .... Two Tablets Twice Daily 14)  Clotrimazole-Betamethasone 1-0.05 % Crea (Clotrimazole-Betamethasone) .... Apply Twice Daily To Affected Areas 15)  Clobex 0.05 % Sham (Clobetasol Propionate) .... Use Once Per Month To Shampoo Hair  Allergies (verified): 1)  ! Benadryl  Past History:  Past medical, surgical, family and social histories (including risk factors) reviewed for relevance to current acute and chronic problems.  Past Medical History: Reviewed history from 07/19/2007 and no changes required. Hypertension Obesity Dyslipidemia Mild Cognitive Impairment Perennial Allergies, uncontrolled  Past Surgical History: Reviewed history from 07/19/2007 and no changes required. Denies surgical history  Family History: Reviewed history from 02/19/2008 and no changes required. Mother- Healthy-suffers from hydradenitis Father- deceased- Huntingtons Disease Brother (half) healthy  Social History: Reviewed history from 02/19/2008 and no changes required. Divorced Never Smoked Alcohol use-no Drug use-no 1 son , healthy  Review of Systems General:  Denies chills and fever. CV:  Denies chest pain or discomfort and swelling of feet. Resp:  Denies shortness of breath. MS:  Complains of joint pain; denies  joint redness, joint swelling, loss of strength, low back pain, cramps, and muscle weakness. Neuro:  Denies weakness.  Physical Exam  General:  Well-developed,well-nourished,in no acute distress; alert,appropriate and cooperative throughout examination Head:  Normocephalic and atraumatic without obvious abnormalities. No apparent alopecia or balding. Lungs:  Normal respiratory effort, chest expands symmetrically. Lungs are clear to auscultation, no crackles or wheezes. Heart:  Normal rate and regular rhythm. S1  and S2 normal without gallop, murmur, click, rub or other extra sounds. Msk:  normal ROM and no joint swelling.  TTP bilat hips over area of trochanteric bursa.  Nontender LS spine, SI joints, & sciatic notches.  Pulses:  R femoral normal and L femoral normal.   Extremities:  No clubbing, cyanosis, edema, or deformity noted with normal full range of motion of all joints.   Neurologic:  strength normal in all extremities.  DTR +2/4 Bital patellar.   Impression & Recommendations:  Problem # 1:  HIP PAIN, BILATERAL (ICD-719.45)  Her updated medication list for this problem includes:    Ibuprofen 800 Mg Tabs (Ibuprofen) .Marland Kitchen... Take 1 tablet by mouth three times a day as needed    Tramadol Hcl 50 Mg Tabs (Tramadol hcl) .Marland Kitchen... 1 q 6 hrs as needed pain  Her updated medication list for this problem includes:    Ibuprofen 800 Mg Tabs (Ibuprofen) .Marland Kitchen... Take 1 tablet by mouth three times a day as needed  Orders: Orthopedic Referral (Ortho) Advised pt to incerase her Ibuprofen to three times a day.  Added tramadol as needed.  Discussed use for HS when the pain is the worse.  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  Problem # 3:  OBESITY, UNSPECIFIED (ICD-278.00) Congratulated pt on her weight loss & encourage her to continue efforts to lose.   Complete Medication List: 1)  Ibuprofen 800 Mg Tabs (Ibuprofen) .... Take 1 tablet by mouth three times a day as needed 2)  Cetirizine Hcl 10 Mg Tabs (Cetirizine hcl) .... Take 1 tablet by mouth once a day 3)  Hydrochlorothiazide 25 Mg Tabs (Hydrochlorothiazide) .... Take 1 tablet by mouth once a day 4)  Klor-con M20 20 Meq Cr-tabs (Potassium chloride crys cr) .... Take 1 tablet by mouth once a day 5)  Astepro 137 Mcg/spray Soln (Azelastine hcl) .... 2 puffs per nostril twice daily 6)  Flonase 50 Mcg/act Susp (Fluticasone propionate) .... 2 puffs per nostril daily 7)  Lunesta 1 Mg Tabs (Eszopiclone) .... Take 1-2  tabs at bedtime 8)  Namenda 10 Mg Tabs (Memantine hcl) .... Take 1 tablet by mouth two times a day 9)  Citalopram Hydrobromide 20 Mg Tabs (Citalopram hydrobromide) .... Take 1 tablet by mouth once a day 10)  Prodigy Meter  .... Once daily testing 11)  Benazepril Hcl 10 Mg Tabs (Benazepril hcl) .... Take 1 tablet by mouth once a day 12)  Galantamine Hydrobromide 24 Mg Xr24h-cap (Galantamine hydrobromide) .... One tab by mouth qd 13)  Metformin Hcl 500 Mg Tabs (Metformin hcl) .... Two tablets twice daily 14)  Clotrimazole-betamethasone 1-0.05 % Crea (Clotrimazole-betamethasone) .... Apply twice daily to affected areas 15)  Clobex 0.05 % Sham (Clobetasol propionate) .... Use once per month to shampoo hair 16)  Tramadol Hcl 50 Mg Tabs (Tramadol hcl) .Marland Kitchen.. 1 q 6 hrs as needed pain  Patient Instructions: 1)  Increase your ibuprofen to 3 times a day. 2)  Ice the area 3 times a day for approx 15 minutes. 3)  We  will refer you to an orthopedic doctor for further evaluation and treatment. Prescriptions: TRAMADOL HCL 50 MG TABS (TRAMADOL HCL) 1 q 6 hrs as needed pain  #30 x 0   Entered and Authorized by:   Esperanza Sheets PA   Signed by:   Esperanza Sheets PA on 08/07/2009   Method used:   Electronically to        [facility]* (retail)       [address]       [place], [place]  [address]       Ph: [phone] or [phone]       Fax: [phone]   RxID:   [id number]

## 2010-07-22 NOTE — Letter (Signed)
Summary: 1st missed letter  1st missed letter   Imported By: Lind Guest 05/12/2010 17:24:02  _____________________________________________________________________  External Attachment:    Type:   Image     Comment:   External Document

## 2010-07-22 NOTE — Progress Notes (Signed)
Summary: refill  Phone Note From Pharmacy   Caller:  Pharmacy* Summary of Call: requesting refill on tramadol Initial call taken by: Adella Hare LPN,  August 31, 2009 4:33 PM  Follow-up for Phone Call        deny should be having ov with me in the next 1 to 4 weeks for routine f/u , i will adress at that visit lether know pls Follow-up by: Syliva Overman MD,  August 31, 2009 5:44 PM  Additional Follow-up for Phone Call Additional follow up Details #1::        called and left message for pt to call office back Additional Follow-up by: Rudene Anda,  September 01, 2009 9:54 AM    Additional Follow-up for Phone Call Additional follow up Details #2::    pt had appt with the PA last week. is what they stated. said doesn't that count for getting that rx refilled? please call them back [phone] Follow-up by: Rudene Anda,  September 01, 2009 11:09 AM  Additional Follow-up for Phone Call Additional follow up Details #3:: Details for Additional Follow-up Action Taken: We referred her to Ortho Feb 18th for this.  Has she seen him yet?     Advise pt I have refilled her Tramadol (Ultram).  She needs to keep appt with ortho though. Additional Follow-up by: Esperanza Sheets PA,  September 01, 2009 3:39 PM  Prescriptions: TRAMADOL HCL 50 MG TABS (TRAMADOL HCL) 1 q 6 hrs as needed pain  #30 x 0   Entered and Authorized by:   Esperanza Sheets PA   Signed by:   Esperanza Sheets PA on 09/01/2009   Method used:   Electronically to        [facility] (retail)       [address]       [place], [place]  [address]       Ph: [phone] or [phone]       Fax: [phone]   RxID:   [id number]  pt has appt with dr. Romeo Apple on the 09/03/2009. and Dr. Diamantina Providence office has notified pt of appt.   pt was called and notified and told to keep her ortho appt. Marland Kitchen

## 2010-07-22 NOTE — Letter (Signed)
Summary: dose change  dose change   Imported By: Lind Guest 06/03/2010 10:12:02  _____________________________________________________________________  External Attachment:    Type:   Image     Comment:   External Document

## 2010-07-22 NOTE — Progress Notes (Signed)
Summary: DERMATOLOGY AND DERMATOPATHOLO  DERMATOLOGY AND DERMATOPATHOLO   Imported By: Lind Guest 02/08/2010 14:24:28  _____________________________________________________________________  External Attachment:    Type:   Image     Comment:   External Document

## 2010-07-22 NOTE — Miscellaneous (Signed)
Summary: PT Progress note  PT Progress note   Imported By: Jacklynn Ganong 11/02/2009 14:57:48  _____________________________________________________________________  External Attachment:    Type:   Image     Comment:   External Document

## 2010-07-22 NOTE — Progress Notes (Signed)
Summary: lovastatin  Phone Note Other Incoming   Caller: [facility] Summary of Call: called in and states they need to clarify which medication patient is to get for her lovastatin they have one for 40 mg and one for 20 mg.  There number is [phone].  Please advise. Initial call taken by: Curtis Sites,  June 04, 2010 11:25 AM  Follow-up for Phone Call        advised pharmacy 20mg  Follow-up by: Adella Hare LPN,  June 04, 2010 3:49 PM  Additional Follow-up for Phone Call Additional follow up Details #1::        Phone call completed Additional Follow-up by: Adella Hare LPN,  June 04, 2010 3:49 PM

## 2010-07-22 NOTE — Progress Notes (Signed)
  Phone Note From Pharmacy   Caller: [facility]* Summary of Call: requesting refill on tramadol, is this ok? Initial call taken by: Adella Hare LPN,  Nov 02, 2009 2:56 PM  Follow-up for Phone Call        yes, same quantitiy as last time. Follow-up by: Esperanza Sheets PA,  Nov 02, 2009 3:03 PM    Prescriptions: TRAMADOL HCL 50 MG TABS (TRAMADOL HCL) 1 q 6 hrs as needed pain  #30 x 0   Entered by:   Adella Hare LPN   Authorized by:   Esperanza Sheets PA   Signed by:   Adella Hare LPN on 04/54/0981   Method used:   Electronically to        [facility] (retail)       [address]       [place], [place]  [address]       Ph: [phone] or [phone]       Fax: [phone]   RxID:   [id number]

## 2010-07-22 NOTE — Progress Notes (Signed)
Summary: RX  Phone Note Call from Patient   Summary of Call: DR WAS SUPPOSED TO CALL IN NOSE SPRAY AND FOOT CREAM   Initial call taken by: Lind Guest,  January 14, 2010 10:30 AM    Prescriptions: CLOTRIMAZOLE-BETAMETHASONE 1-0.05 % CREA (CLOTRIMAZOLE-BETAMETHASONE) apply to affected area two times a day  #1 month x 0   Entered by:   Everitt Amber LPN   Authorized by:   Syliva Overman MD   Signed by:   Everitt Amber LPN on 16/03/9603   Method used:   Electronically to        [facility] (retail)       [address]       [place], [place]  [address]       Ph: [phone] or [phone]       Fax: [phone]   RxID:   [id number] FLONASE 50 MCG/ACT SUSP (FLUTICASONE PROPIONATE) 2 puffs per nostril daILY  #1 x 3   Entered by:   Everitt Amber LPN   Authorized by:   Syliva Overman MD   Signed by:   Everitt Amber LPN on 40/34/7425   Method used:   Electronically to        [facility] (retail)       [address]       [place], [place]  [address]       Ph: [phone] or [phone]       Fax: [phone]   RxID:   [id number]

## 2010-08-16 ENCOUNTER — Telehealth: Payer: Self-pay | Admitting: Family Medicine

## 2010-08-26 NOTE — Progress Notes (Signed)
Summary: RX BEDSIDE COMMODE  Phone Note Call from Patient   Summary of Call: NEEDS A BEDSIDE COMMODE PLEASE SEND TO [facility] PLEASE CALL MOTHER WHEN DONE [phone] Initial call taken by: Lind Guest,  August 16, 2010 4:27 PM  Follow-up for Phone Call        pls document from mother's standpoint why she needs this , is she wetting her bed, falling, is the bathroom "far away"? Follow-up by: Syliva Overman MD,  August 16, 2010 5:15 PM  Additional Follow-up for Phone Call Additional follow up Details #1::        called patients mother, no answer Additional Follow-up by: Adella Hare LPN,  August 17, 2010 2:42 PM    Additional Follow-up for Phone Call Additional follow up Details #2::    Patients mother states that she is using the CPAP machine at night and she has to get up several times during the night to go the bathroom and she doesn't know how to unhook the machine and get it put back on correctly so she needs a bedside commode to sit beside the bed so she can just leave the device on. Follow-up by: Everitt Amber LPN,  August 18, 2010 10:53 AM  Additional Follow-up for Phone Call Additional follow up Details #3:: Details for Additional Follow-up Action Taken: pls advise MOm to get them to showw her how to [facility] the device it is easy, , she can even just bring the mask and tubing, not he machine  here any I can show her what to doto unhook the mask from the machine Additional Follow-up by: Syliva Overman MD,  August 18, 2010 12:17 PM  Explained to mother the response and she didn't seem to understand why she could not get one but she said ok

## 2010-09-01 LAB — CONVERTED CEMR LAB
AST: 13 units/L (ref 0–37)
Alkaline Phosphatase: 43 units/L (ref 39–117)
Bilirubin, Direct: 0.1 mg/dL (ref 0.0–0.3)
CO2: 24 meq/L (ref 19–32)
Calcium: 9.6 mg/dL (ref 8.4–10.5)
Creatinine, Ser: 1.11 mg/dL (ref 0.40–1.20)
Glucose, Bld: 98 mg/dL (ref 70–99)
HDL: 34 mg/dL — ABNORMAL LOW (ref >39)
Microalb Creat Ratio: 7.3 mg/g (ref 0.0–30.0)
Total Bilirubin: 0.4 mg/dL (ref 0.3–1.2)

## 2010-09-03 ENCOUNTER — Encounter: Payer: Self-pay | Admitting: Family Medicine

## 2010-09-03 ENCOUNTER — Ambulatory Visit (HOSPITAL_COMMUNITY)
Admission: RE | Admit: 2010-09-03 | Discharge: 2010-09-03 | Disposition: A | Discharge status: Home / Self Care | Payer: Medicare Other | Source: Ambulatory Visit | Attending: Family Medicine | Admitting: Family Medicine

## 2010-09-03 ENCOUNTER — Other Ambulatory Visit: Payer: Self-pay | Admitting: Family Medicine

## 2010-09-03 ENCOUNTER — Ambulatory Visit (INDEPENDENT_AMBULATORY_CARE_PROVIDER_SITE_OTHER): Payer: Medicare Other | Admitting: Family Medicine

## 2010-09-03 DIAGNOSIS — M542 Cervicalgia: Secondary | ICD-10-CM | POA: Insufficient documentation

## 2010-09-03 DIAGNOSIS — IMO0002 Reserved for concepts with insufficient information to code with codable children: Secondary | ICD-10-CM | POA: Insufficient documentation

## 2010-09-03 DIAGNOSIS — E785 Hyperlipidemia, unspecified: Secondary | ICD-10-CM

## 2010-09-03 DIAGNOSIS — X58XXXA Exposure to other specified factors, initial encounter: Secondary | ICD-10-CM | POA: Insufficient documentation

## 2010-09-03 DIAGNOSIS — I10 Essential (primary) hypertension: Secondary | ICD-10-CM

## 2010-09-06 DIAGNOSIS — B351 Tinea unguium: Secondary | ICD-10-CM | POA: Insufficient documentation

## 2010-09-07 ENCOUNTER — Encounter: Payer: Self-pay | Admitting: Family Medicine

## 2010-09-16 NOTE — Letter (Signed)
Summary: Letter  Letter   Imported By: Lind Guest 09/07/2010 11:39:07  _____________________________________________________________________  External Attachment:    Type:   Image     Comment:   External Document

## 2010-09-16 NOTE — Assessment & Plan Note (Signed)
Summary: f up   Vital Signs:  Patient profile:   40 year old female Menstrual status:  regular Height:      65 inches Weight:      177.75 pounds BMI:     29.69 O2 Sat:      96 % on Room air Pulse rate:   77 / minute Pulse rhythm:   regular Resp:     16 per minute BP sitting:   100 / 68  (left arm)  Vitals Entered By: Adella Hare LPN (September 03, 2010 9:20 AM)  Nutrition Counseling: Patient's BMI is greater than 25 and therefore counseled on weight management options.  O2 Flow:  Room air CC: follow-up visit Is Patient Diabetic? Yes   Primary Care Provider:  margaret simpson, md  CC:  follow-up visit.  History of Present Illness: Reports  that she has been  well. Denies recent fever or chills. Denies sinus pressure, nasal congestion , ear pain or sore throat. Denies chest congestion, or cough productive of sputum. Denies chest pain, palpitations, PND, orthopnea or leg swelling. Denies abdominal pain, nausea, vomitting, diarrhea or constipation. Denies change in bowel movements or bloody stool. Denies dysuria , frequency, incontinence or hesitancy. Reports joint pain, and  reduced mobility.reports instability and recurrent falls, which is relatively new Denies headaches, vertigo, seizures. Denies depression, anxiety or insomnia. Denies  rash, lesions, or itch. Pt tests blood sugars once daily and her values are within the expected ranges. she dances for a t least 30 mins 5 days per weeek     Current Medications (verified): 1)  Cetirizine Hcl 10 Mg  Tabs (Cetirizine Hcl) .... Take 1 Tablet By Mouth Once A Day 2)  Flonase 50 Mcg/act Susp (Fluticasone Propionate) .... 2 Puffs Per Nostril Daily 3)  Namenda 10 Mg Tabs (Memantine Hcl) .... Take 1 Tablet By Mouth Two Times A Day 4)  Prodigy Meter .... Once Daily Testing 5)  Benazepril Hcl 10 Mg Tabs (Benazepril Hcl) .... Take 1 Tablet By Mouth Once A Day 6)  Galantamine Hydrobromide 24 Mg Xr24h-Cap (Galantamine Hydrobromide)  .... One Tab By Mouth Qd 7)  Ferrous Sulfate 324 Mg Tbec (Ferrous Sulfate) .... Take 1 Tablet By Mouth Once A Day 8)  Seroquel 50 Mg Tabs (Quetiapine Fumarate) .... Take 1 Tab By Mouth At Bedtime 9)  Clotrimazole-Betamethasone 1-0.05 % Crea (Clotrimazole-Betamethasone) .... Apply To Affected Area Two Times A Day 10)  Multivitamins  Tabs (Multiple Vitamin) .... Take 1 Tablet By Mouth Once A Day 11)  Fish Oil 1000 Mg Caps (Omega-3 Fatty Acids) .... Take 1 Tablet By Mouth Two Times A Day 12)  Lovastatin 20 Mg Tabs (Lovastatin) .... One Tab By Mouth At Bedtime 13)  Metformin Hcl 500 Mg Tabs (Metformin Hcl) .... Take 1 Tablet By Mouth Two Times A Day  Allergies (verified): 1)  ! Benadryl  Past History:  Past medical, surgical, family and social histories (including risk factors) reviewed, and no changes noted (except as noted below).  Past Medical History: Hypertension Obesity Dyslipidemia Mild Cognitive Impairment Perennial Allergies, uncontrolled diabetes anxiety sleep apnea dx  in 2012  Past Surgical History: Reviewed history from 07/19/2007 and no changes required. Denies surgical history  Family History: Reviewed history from 09/03/2009 and no changes required. Mother- Healthy-suffers from hydradenitis Father- deceased- Huntingtons Disease Brother (half) healthy FH of Cancer:  Family History of Diabetes Family History Coronary Heart Disease female < 18  Social History: Reviewed history from 09/03/2009 and no changes required. Divorced disabled  Never Smoked Alcohol use-no Drug use-no 1 son , healthy no caffeine use  Review of Systems      See HPI General:  Complains of fatigue. Eyes:  Denies blurring and discharge. MS:  Complains of joint pain; neck pain intermittently since recent fall when she hit the back of the neck. Derm:  Complains of changes in color of skin and changes in nail beds. Neuro:  Complains of poor balance; falling on avg twice per month in the  past 4 months. Endo:  Denies excessive thirst and excessive urination; daily fastings  less than 110. Heme:  Denies abnormal bruising, bleeding, and enlarge lymph nodes. Allergy:  Denies hives or rash and itching eyes.  Physical Exam  General:  Well-developed,well-nourished,in no acute distress; alert,appropriate and cooperative throughout examination HEENT: No facial asymmetry,  EOMI, No sinus tenderness, TM's Clear, oropharynx  pink and moist. Decreased ROM c spine   Chest: Clear to auscultation bilaterally.  CVS: S1, S2, No murmurs, No S3.   Abd: Soft, Nontender.  MS: Adequate ROM spine, hips, shoulders and knees.  Ext: No edema.   CNS: CN 2-12 intact, power tone and sensation normal throughout.   Skin: Intact, no visible lesions or rashes.  Psych: Good eye contact, flat affect.  Memory loss, not anxious or depressed appearing.  Neurologic:  No cranial nerve deficits noted. Station and gait are normal. Plantar reflexes are down-going bilaterally. DTRs are symmetrical throughout. Sensory, motor and coordinative functions appear intact. Psych:  Cognition and judgment appear intact. Alert and cooperative with normal attention span and concentration. No apparent delusions, illusions, hallucinations  Diabetes Management Exam:    Foot Exam (with socks and/or shoes not present):       Sensory-Monofilament:          Left foot: normal          Right foot: normal       Inspection:          Left foot: normal          Right foot: normal       Nails:          Left foot: thickened          Right foot: thickened   Impression & Recommendations:  Problem # 1:  NECK PAIN (ICD-723.1) Assessment Comment Only  The following medications were removed from the medication list:    Naprosyn 500 Mg Tabs (Naproxen) .Marland Kitchen... Take 1 tablet by mouth two times a day Her updated medication list for this problem includes:    Acetaminophen 500 Mg Caps (Acetaminophen) ..... Ont tablet once daily as needed for  pain, 30 tabs to last 4 months  Orders: Medicare Electronic Prescription [phone]) Radiology other (Radiology Other)  Problem # 2:  DIABETES MELLITUS (ICD-250.00) Assessment: Improved  Her updated medication list for this problem includes:    Benazepril Hcl 10 Mg Tabs (Benazepril hcl) .Marland Kitchen... Take 1 tablet by mouth once a day    Metformin Hcl 500 Mg Tabs (Metformin hcl) .Marland Kitchen... Take 1 tablet by mouth two times a day  Orders: T- Hemoglobin A1C ([id number]) Podiatry Referral (Podiatry)  Labs Reviewed: Creat: 1.11 (09/01/2010)    Reviewed HgBA1c results: 6.1 (09/01/2010)  6.2 (05/31/2010)  Problem # 3:  DYSLIPIDEMIA (ICD-272.4) Assessment: Improved  Her updated medication list for this problem includes:    Lovastatin 20 Mg Tabs (Lovastatin) ..... One tab by mouth at bedtime  Orders: T-Hepatic Function [phone])  Labs Reviewed: SGOT: 13 (09/01/2010)   SGPT: 8 (  09/01/2010)   HDL:34 (09/01/2010), 37 (05/31/2010)  LDL:71 (09/01/2010), 129 (16/03/9603)  Chol:128 (09/01/2010), 192 (05/31/2010)  Trig:113 (09/01/2010), 132 (05/31/2010)  Problem # 4:  ONYCHOMYCOSIS (ICD-110.1) Assessment: Comment Only  Her updated medication list for this problem includes:    Clotrimazole-betamethasone 1-0.05 % Crea (Clotrimazole-betamethasone) .Marland Kitchen... Apply to affected area two times a day    Terbinafine Hcl 250 Mg Tabs (Terbinafine hcl) .Marland Kitchen... Take 1 tablet by mouth once a day  Complete Medication List: 1)  Cetirizine Hcl 10 Mg Tabs (Cetirizine hcl) .... Take 1 tablet by mouth once a day 2)  Flonase 50 Mcg/act Susp (Fluticasone propionate) .... 2 puffs per nostril daily 3)  Namenda 10 Mg Tabs (Memantine hcl) .... Take 1 tablet by mouth two times a day 4)  Prodigy Meter  .... Once daily testing 5)  Benazepril Hcl 10 Mg Tabs (Benazepril hcl) .... Take 1 tablet by mouth once a day 6)  Galantamine Hydrobromide 24 Mg Xr24h-cap (Galantamine hydrobromide) .... One tab by mouth qd 7)  Ferrous Sulfate 324  Mg Tbec (Ferrous sulfate) .... Take 1 tablet by mouth once a day 8)  Seroquel 50 Mg Tabs (Quetiapine fumarate) .... Take 1 tab by mouth at bedtime 9)  Clotrimazole-betamethasone 1-0.05 % Crea (Clotrimazole-betamethasone) .... Apply to affected area two times a day 10)  Multivitamins Tabs (Multiple vitamin) .... Take 1 tablet by mouth once a day 11)  Fish Oil 1000 Mg Caps (Omega-3 fatty acids) .... Take 1 tablet by mouth two times a day 12)  Lovastatin 20 Mg Tabs (Lovastatin) .... One tab by mouth at bedtime 13)  Metformin Hcl 500 Mg Tabs (Metformin hcl) .... Take 1 tablet by mouth two times a day 14)  Terbinafine Hcl 250 Mg Tabs (Terbinafine hcl) .... Take 1 tablet by mouth once a day 15)  Acetaminophen 500 Mg Caps (Acetaminophen) .... Ont tablet once daily as needed for pain, 30 tabs to last 4 months  Other Orders: T-CBC w/Diff ([id number]) T-TSH [phone])  Patient Instructions: 1)  Please schedule a follow-up appointment in 4 months. 2)  It is important that you exercise regularly at least 20 minutes 5 times a week. If you develop chest pain, have severe difficulty breathing, or feel very tired , stop exercising immediately and seek medical attention. 3)  You need to lose weight. Consider a lower calorie diet and regular exercise.  4)  HbgA1C prior to visit, ICD-9:  in 4 months 5)  TSH prior to visit, ICD-9:   in 4 months 6)  CBC w/ Diff prior to visit, ICD-9:, hepatic panel 7)  Med is sent in for pain and for fungal toenail infection 8)  You will be referred to podiatry for toenail cutting Prescriptions: METFORMIN HCL 500 MG TABS (METFORMIN HCL) Take 1 tablet by mouth two times a day  #60 x 3   Entered by:   Adella Hare LPN   Authorized by:   Syliva Overman MD   Signed by:   Adella Hare LPN on 86/57/8469   Method used:   Electronically to        [facility]* (retail)       [address]       [place], [place]  [address]       Ph:  [phone] or [phone]       Fax: [phone]   RxID:   [id number] LOVASTATIN 20 MG TABS (LOVASTATIN) one tab by mouth at bedtime  #30 x  3   Entered by:   Adella Hare LPN   Authorized by:   Syliva Overman MD   Signed by:   Adella Hare LPN on 04/54/0981   Method used:   Electronically to        [facility] (retail)       [address]       [place], [place]  [address]       Ph: [phone] or [phone]       Fax: [phone]   RxID:   [id number] ACETAMINOPHEN 500 MG CAPS (ACETAMINOPHEN) ont tablet once daily as needed for pain, 30 tabs to last 4 months  #30 x 0   Entered and Authorized by:   Syliva Overman MD   Signed by:   Syliva Overman MD on 09/03/2010   Method used:   Electronically to        [facility] (retail)       [address]       [place], [place]  [address]       Ph: [phone] or [phone]       Fax: [phone]   RxID:   [id number] TERBINAFINE HCL 250 MG TABS (TERBINAFINE HCL) Take 1 tablet by mouth once a day  #90 x 1   Entered and Authorized by:   Syliva Overman MD   Signed by:   Syliva Overman MD on 09/03/2010   Method used:   Electronically to        [facility] (retail)       [address]       [place], [place]  [address]       Ph: [phone] or [phone]       Fax: [phone]   RxID:   [id number]    Orders Added: 1)  Est. Patient Level IV [93810] 2)  Medicare Electronic Prescription [G8553] 3)  Radiology other [Radiology Other] 4)  T-CBC w/Diff [[id number]] 5)  T-Hepatic Function [[id number]] 6)  T- Hemoglobin A1C [83036-23375] 7)  T-TSH [[id number]] 8)  Podiatry Referral [Podiatry]

## 2010-09-28 ENCOUNTER — Other Ambulatory Visit: Payer: Self-pay | Admitting: Family Medicine

## 2010-12-28 ENCOUNTER — Other Ambulatory Visit: Payer: Self-pay | Admitting: Family Medicine

## 2011-01-12 ENCOUNTER — Other Ambulatory Visit: Payer: Self-pay | Admitting: Family Medicine

## 2011-01-12 ENCOUNTER — Encounter: Payer: Self-pay | Admitting: Family Medicine

## 2011-01-13 LAB — TSH: TSH: 2.245 u[IU]/mL (ref 0.350–4.500)

## 2011-01-13 LAB — HEPATIC FUNCTION PANEL
Bilirubin, Direct: 0.1 mg/dL (ref 0.0–0.3)
Indirect Bilirubin: 0.4 mg/dL (ref 0.0–0.9)
Total Protein: 7.2 g/dL (ref 6.0–8.3)

## 2011-01-13 LAB — CBC WITH DIFFERENTIAL/PLATELET
Basophils Absolute: 0 10*3/uL (ref 0.0–0.1)
Basophils Relative: 1 % (ref 0–1)
Lymphocytes Relative: 33 % (ref 12–46)
MCHC: 30.8 g/dL (ref 30.0–36.0)
Neutro Abs: 4.7 10*3/uL (ref 1.7–7.7)
Platelets: 312 10*3/uL (ref 150–400)
RDW: 14.2 % (ref 11.5–15.5)
WBC: 7.7 10*3/uL (ref 4.0–10.5)

## 2011-01-13 LAB — HEMOGLOBIN A1C
Hgb A1c MFr Bld: 6.5 % — ABNORMAL HIGH (ref <5.7)
Mean Plasma Glucose: 140 mg/dL — ABNORMAL HIGH (ref <117)

## 2011-01-14 ENCOUNTER — Encounter: Payer: Self-pay | Admitting: Family Medicine

## 2011-01-14 ENCOUNTER — Ambulatory Visit (INDEPENDENT_AMBULATORY_CARE_PROVIDER_SITE_OTHER): Payer: Medicare Other | Admitting: Family Medicine

## 2011-01-14 VITALS — BP 110/70 | HR 98 | Resp 16 | Ht 63.0 in | Wt 183.8 lb

## 2011-01-14 DIAGNOSIS — E119 Type 2 diabetes mellitus without complications: Secondary | ICD-10-CM

## 2011-01-14 DIAGNOSIS — E669 Obesity, unspecified: Secondary | ICD-10-CM

## 2011-01-14 DIAGNOSIS — E785 Hyperlipidemia, unspecified: Secondary | ICD-10-CM

## 2011-01-14 DIAGNOSIS — I10 Essential (primary) hypertension: Secondary | ICD-10-CM

## 2011-01-14 DIAGNOSIS — G10 Huntington's disease: Secondary | ICD-10-CM

## 2011-01-14 MED ORDER — BENAZEPRIL HCL 10 MG PO TABS: 10.0000 mg | ORAL_TABLET | Freq: Every day | ORAL | Status: DC

## 2011-01-14 MED ORDER — CETIRIZINE HCL 10 MG PO TABS: 10.0000 mg | ORAL_TABLET | Freq: Every day | ORAL | Status: DC

## 2011-01-14 NOTE — Assessment & Plan Note (Signed)
Controlled, no change in medication  

## 2011-01-14 NOTE — Patient Instructions (Addendum)
CPE in 3.5  Months.  Fasting cmp, eGFR HBA1C in 3.5 months  It is important that you exercise regularly at least 30 minutes 5 times a week. If you develop chest pain, have severe difficulty breathing, or feel very tired, stop exercising immediately and seek medical attention    A healthy diet is rich in fruit, vegetables and whole grains. Poultry fish, nuts and beans are a healthy choice for protein rather then red meat. A low sodium diet and drinking 64 ounces of water daily is generally recommended. Oils and sweet should be limited. Carbohydrates especially for those who are diabetic or overweight, should be limited to 34-45 gram per meal. It is important to eat on a regular schedule, at least 3 times daily. Snacks should be primarily fruits, vegetables or nuts.   You are ereferred for annual diabetic eye exam , when due  Weight loss goal of 6 pounds

## 2011-01-14 NOTE — Assessment & Plan Note (Signed)
followed by neurology at [facility], no significant change in function

## 2011-01-14 NOTE — Assessment & Plan Note (Signed)
Control not as good, pt to start regular exercise, change eating and lose weight

## 2011-01-14 NOTE — Progress Notes (Signed)
  Subjective:    Patient ID: Victoria [name], female    DOB: Aug 20, 1970, 40 y.o.   MRN: [id number]  HPI HYPERTENSION  Disease Monitoring  Blood pressure range-unknown Chest pain- no      Dyspnea- no  Compliance- good Lightheadedness- no   Edema- no  DIABETES  Disease Monitoring  Blood Sugar ranges-97 to 120 fasting Polyuria- no New Visual problems- no  Compliance- good Hypoglycemic symptoms- no  Last eye exam: due      Podiatry visit: no  HYPERLIPIDEMIA  Compliance- good RUQ pain- no  Muscle aches- no   REGULAR EXERCISE-no      Review of Systems See HPI Denies recent fever or chills. Denies sinus pressure, nasal congestion, ear pain or sore throat. Denies chest congestion, productive cough or wheezing. Denies chest pains, palpitations and leg swelling Denies abdominal pain, nausea, vomiting,diarrhea or constipation.   Denies dysuria, frequency, hesitancy or incontinence. Denies joint pain, swelling and limitation in mobility. Denies headaches, seizures, numbness, or tingling. Denies depression, anxiety or insomnia. Denies skin break down or rash.        Objective:   Physical Exam Patient alert and oriented and in no cardiopulmonary distress.  HEENT: No facial asymmetry, EOMI, no sinus tenderness,  oropharynx pink and moist.  Neck supple no adenopathy.  Chest: Clear to auscultation bilaterally.  CVS: S1, S2 no murmurs, no S3.  ABD: Soft non tender. Bowel sounds normal.  Ext: No edema  MS: Adequate ROM spine, shoulders, hips and knees.  Skin: Intact, no ulcerations or rash noted.  Psych: Good eye contact, normal affect. Memory intact not anxious or depressed appearing.  CNS: CN 2-12 intact, power, tone and sensation normal throughout.        Assessment & Plan:   No problem-specific assessment & plan notes found for this encounter.

## 2011-01-14 NOTE — Assessment & Plan Note (Signed)
Deteriorated. Patient re-educated about  the importance of commitment to a  minimum of 150 minutes of exercise per week. The importance of healthy food choices with portion control discussed. Encouraged to start a food diary, count calories and to consider  joining a support group. Sample diet sheets offered. Goals set by the patient for the next several months.    

## 2011-02-23 ENCOUNTER — Other Ambulatory Visit: Payer: Self-pay | Admitting: Family Medicine

## 2011-04-22 ENCOUNTER — Encounter: Payer: Self-pay | Admitting: Family Medicine

## 2011-04-23 LAB — LIPID PANEL
HDL: 33 mg/dL — ABNORMAL LOW (ref >39)
LDL Cholesterol: 75 mg/dL (ref 0–99)
Triglycerides: 105 mg/dL (ref <150)

## 2011-04-23 LAB — BASIC METABOLIC PANEL
CO2: 23 mEq/L (ref 19–32)
Calcium: 9.3 mg/dL (ref 8.4–10.5)
Chloride: 105 mEq/L (ref 96–112)
Glucose, Bld: 93 mg/dL (ref 70–99)
Sodium: 138 mEq/L (ref 135–145)

## 2011-04-26 ENCOUNTER — Encounter: Payer: Self-pay | Admitting: Family Medicine

## 2011-04-27 ENCOUNTER — Ambulatory Visit (INDEPENDENT_AMBULATORY_CARE_PROVIDER_SITE_OTHER): Payer: Medicare Other | Admitting: Family Medicine

## 2011-04-27 ENCOUNTER — Encounter: Payer: Self-pay | Admitting: Family Medicine

## 2011-04-27 ENCOUNTER — Other Ambulatory Visit: Payer: Self-pay | Admitting: Family Medicine

## 2011-04-27 ENCOUNTER — Other Ambulatory Visit (HOSPITAL_COMMUNITY)
Admission: RE | Admit: 2011-04-27 | Discharge: 2011-04-27 | Disposition: A | Discharge status: Home / Self Care | Payer: Medicare Other | Source: Ambulatory Visit | Attending: Family Medicine | Admitting: Family Medicine

## 2011-04-27 VITALS — BP 108/72 | HR 100 | Resp 16 | Ht 63.0 in | Wt 183.1 lb

## 2011-04-27 DIAGNOSIS — I10 Essential (primary) hypertension: Secondary | ICD-10-CM

## 2011-04-27 DIAGNOSIS — G10 Huntington's disease: Secondary | ICD-10-CM

## 2011-04-27 DIAGNOSIS — Z139 Encounter for screening, unspecified: Secondary | ICD-10-CM

## 2011-04-27 DIAGNOSIS — Z124 Encounter for screening for malignant neoplasm of cervix: Secondary | ICD-10-CM | POA: Insufficient documentation

## 2011-04-27 DIAGNOSIS — E669 Obesity, unspecified: Secondary | ICD-10-CM

## 2011-04-27 DIAGNOSIS — Z1211 Encounter for screening for malignant neoplasm of colon: Secondary | ICD-10-CM

## 2011-04-27 DIAGNOSIS — Z01419 Encounter for gynecological examination (general) (routine) without abnormal findings: Secondary | ICD-10-CM

## 2011-04-27 DIAGNOSIS — E119 Type 2 diabetes mellitus without complications: Secondary | ICD-10-CM

## 2011-04-27 DIAGNOSIS — Z0000 Encounter for general adult medical examination without abnormal findings: Secondary | ICD-10-CM

## 2011-04-27 DIAGNOSIS — Z23 Encounter for immunization: Secondary | ICD-10-CM

## 2011-04-27 LAB — HEMOCCULT GUIAC POC 1CARD (OFFICE)

## 2011-04-27 MED ORDER — LORAZEPAM 1 MG PO TABS: ORAL_TABLET | ORAL | Status: DC

## 2011-04-27 NOTE — Patient Instructions (Signed)
F/u in 4 months  hBA1c in 4 month before next visit.  Please commit to more exercise so your good cholesterol can improve.  Ativan sent in for use before your radiologic test  Flu vaccine today.  Mammogram will be scheduled

## 2011-04-27 NOTE — Progress Notes (Signed)
  Subjective:    Patient ID: Victoria [name], female    DOB: 1971-05-14, 40 y.o.   MRN: [id number]  HPI The PT is here for  Annual exam  and re-evaluation of chronic medical conditions, medication management and review of any available recent lab and radiology data.  Preventive health is updated, specifically  Cancer screening and Immunization.   Questions or concerns regarding consultations or procedures which the PT has had in the interim are  addressed. The PT denies any adverse reactions to current medications since the last visit.  There are no new concerns.  There are no specific complaints       Review of Systems See HPI Denies recent fever or chills. Denies sinus pressure, nasal congestion, ear pain or sore throat. Denies chest congestion, productive cough or wheezing. Denies chest pains, palpitations and leg swelling Denies abdominal pain, nausea, vomiting,diarrhea or constipation.   Denies dysuria, frequency, hesitancy or incontinence. Denies joint pain, swelling and limitation in mobility. Denies headaches, seizures, numbness, or tingling. Denies uncontrolled  depression, anxiety or insomnia. Denies skin break down or rash.        Objective:   Physical Exam Pleasant well nourished female, alert  in no cardio-pulmonary distress. Afebrile. HEENT No facial trauma or asymetry. Sinuses non tender.  EOMI, PERTL, fundoscopic exam is normal, no hemorhage or exudate.  External ears normal, tympanic membranes clear. Oropharynx moist, no exudate, dentitionfair Neck: supple, no adenopathy,JVD or thyromegaly.No bruits.  Chest: Clear to ascultation bilaterally.No crackles or wheezes. Non tender to palpation  Breast: No asymetry,no masses. No nipple discharge or inversion. No axillary or supraclavicular adenopathy  Cardiovascular system; Heart sounds normal,  S1 and  S2 ,no S3.  No murmur, or thrill. Apical beat not displaced Peripheral pulses  normal.  Abdomen: Soft, non tender, no organomegaly or masses. No bruits. Bowel sounds normal. No guarding, tenderness or rebound.  Rectal:  No mass. Guaiac negative stool.  GU: External genitalia normal. No lesions. Vaginal canal normal.No discharge. Uterus normal size, no adnexal masses, no cervical motion or adnexal tenderness.  Musculoskeletal exam: Full ROM of spine, hips , shoulders and knees. No deformity ,swelling or crepitus noted. No muscle wasting or atrophy.   Neurologic: Cranial nerves 2 to 12 intact. Power, tone ,sensation and reflexes normal throughout. No disturbance in gait. No tremor.  Skin: Intact, no ulceration,tinea pedis and onychomycosis Pigmentation normal throughout  Psych; Blunted  affect. Judgement and concentration abnormal, mental health appears to be deteriorating  Diabetic Foot Check:  Appearance - n calluses Skin - no unusual pallor or redness Sensation - grossly intact to light touch Monofilament testing -  Right - Great toe, medial, central, lateral ball and posterior foot decreased  Left - Great toe, medial, central, lateral ball and posterior foot decreased  Pulses Left - Dorsalis Pedis and Posterior Tibia normal Right - Dorsalis Pedis and Posterior Tibia normal        Assessment & Plan:

## 2011-05-10 ENCOUNTER — Ambulatory Visit (HOSPITAL_COMMUNITY)
Admission: RE | Admit: 2011-05-10 | Discharge: 2011-05-10 | Disposition: A | Discharge status: Home / Self Care | Payer: Medicare Other | Source: Ambulatory Visit | Attending: Family Medicine | Admitting: Family Medicine

## 2011-05-10 ENCOUNTER — Other Ambulatory Visit: Payer: Self-pay | Admitting: Family Medicine

## 2011-05-10 DIAGNOSIS — Z139 Encounter for screening, unspecified: Secondary | ICD-10-CM

## 2011-05-10 DIAGNOSIS — Z1231 Encounter for screening mammogram for malignant neoplasm of breast: Secondary | ICD-10-CM | POA: Insufficient documentation

## 2011-05-13 NOTE — Assessment & Plan Note (Signed)
Slightly improved and controlled, no med change. Pt encouraged to commit to regular exercise

## 2011-05-13 NOTE — Assessment & Plan Note (Signed)
Unchanged. Patient re-educated about  the importance of commitment to a  minimum of 150 minutes of exercise per week. The importance of healthy food choices with portion control discussed. Encouraged to start a food diary, count calories and to consider  joining a support group. Sample diet sheets offered. Goals set by the patient for the next several months.    

## 2011-05-13 NOTE — Assessment & Plan Note (Signed)
Followed by neurology at [facility], appears to be deteriorating at this visit, discussed this observation with her mother , and asked her to get the perspective of the neurologist at [facility] next visit

## 2011-05-13 NOTE — Assessment & Plan Note (Signed)
Controlled, no change in medication  

## 2011-05-23 ENCOUNTER — Other Ambulatory Visit: Payer: Self-pay | Admitting: Family Medicine

## 2011-05-23 DIAGNOSIS — R928 Other abnormal and inconclusive findings on diagnostic imaging of breast: Secondary | ICD-10-CM

## 2011-05-24 ENCOUNTER — Telehealth: Payer: Self-pay | Admitting: Family Medicine

## 2011-05-24 NOTE — Telephone Encounter (Signed)
Noted, thanks!

## 2011-05-24 NOTE — Telephone Encounter (Signed)
Her mother called back and said that she has appointment in the morning at the [facility]

## 2011-05-25 ENCOUNTER — Ambulatory Visit (HOSPITAL_COMMUNITY)
Admission: RE | Admit: 2011-05-25 | Discharge: 2011-05-25 | Disposition: A | Discharge status: Home / Self Care | Payer: Medicare Other | Source: Ambulatory Visit | Attending: Family Medicine | Admitting: Family Medicine

## 2011-05-25 DIAGNOSIS — R928 Other abnormal and inconclusive findings on diagnostic imaging of breast: Secondary | ICD-10-CM | POA: Insufficient documentation

## 2011-06-23 ENCOUNTER — Other Ambulatory Visit: Payer: Self-pay | Admitting: Family Medicine

## 2011-07-05 ENCOUNTER — Ambulatory Visit: Payer: Medicare Other | Attending: Psychiatry | Admitting: Occupational Therapy

## 2011-07-05 DIAGNOSIS — IMO0001 Reserved for inherently not codable concepts without codable children: Secondary | ICD-10-CM | POA: Insufficient documentation

## 2011-07-05 DIAGNOSIS — M6281 Muscle weakness (generalized): Secondary | ICD-10-CM | POA: Diagnosis not present

## 2011-07-05 DIAGNOSIS — R4189 Other symptoms and signs involving cognitive functions and awareness: Secondary | ICD-10-CM | POA: Insufficient documentation

## 2011-08-03 ENCOUNTER — Ambulatory Visit (HOSPITAL_COMMUNITY)
Admission: RE | Admit: 2011-08-03 | Discharge: 2011-08-03 | Disposition: A | Discharge status: Home / Self Care | Payer: Medicare Other | Source: Ambulatory Visit | Attending: Psychiatry | Admitting: Psychiatry

## 2011-08-03 ENCOUNTER — Encounter (HOSPITAL_COMMUNITY): Payer: Self-pay | Admitting: Occupational Therapy

## 2011-08-03 DIAGNOSIS — I10 Essential (primary) hypertension: Secondary | ICD-10-CM | POA: Diagnosis not present

## 2011-08-03 DIAGNOSIS — M6281 Muscle weakness (generalized): Secondary | ICD-10-CM | POA: Insufficient documentation

## 2011-08-03 DIAGNOSIS — R2689 Other abnormalities of gait and mobility: Secondary | ICD-10-CM | POA: Insufficient documentation

## 2011-08-03 DIAGNOSIS — E785 Hyperlipidemia, unspecified: Secondary | ICD-10-CM | POA: Insufficient documentation

## 2011-08-03 DIAGNOSIS — W19XXXA Unspecified fall, initial encounter: Secondary | ICD-10-CM | POA: Insufficient documentation

## 2011-08-03 DIAGNOSIS — IMO0001 Reserved for inherently not codable concepts without codable children: Secondary | ICD-10-CM | POA: Diagnosis not present

## 2011-08-03 DIAGNOSIS — M25619 Stiffness of unspecified shoulder, not elsewhere classified: Secondary | ICD-10-CM | POA: Diagnosis not present

## 2011-08-03 DIAGNOSIS — R4189 Other symptoms and signs involving cognitive functions and awareness: Secondary | ICD-10-CM | POA: Insufficient documentation

## 2011-08-03 DIAGNOSIS — M25519 Pain in unspecified shoulder: Secondary | ICD-10-CM | POA: Insufficient documentation

## 2011-08-03 DIAGNOSIS — E119 Type 2 diabetes mellitus without complications: Secondary | ICD-10-CM | POA: Diagnosis not present

## 2011-08-03 DIAGNOSIS — R279 Unspecified lack of coordination: Secondary | ICD-10-CM | POA: Insufficient documentation

## 2011-08-03 HISTORY — DX: Huntington's disease: G10

## 2011-08-03 NOTE — Evaluation (Signed)
Physical Therapy Evaluation  Patient Details  Name: Victoria [name] MRN: [id number] Date of Birth: 03/29/1971  Today's Date: 08/03/2011 Time: 4098-1191 Time Calculation (min): 45 min Charges: 1 EV Visit#: 1  of 16   Re-eval: 09/02/11  Assessment Diagnosis: Huntington's disease, decreased balance, falls Prior Therapy: none  Past Medical History:  Past Medical History  Diagnosis Date  . Hypertension   . Obesity   . Dyslipidemia   . Mild cognitive impairment   . Perennial allergic rhinitis   . Diabetes mellitus   . Anxiety   . Huntington's disease    Subjective Symptoms/Limitations Symptoms: The patient and mother are present for evaluation today.  They report increased history of falls over the past 3 months.  The patient reports multiple "stumbles" daily.  The patient is diagnosed with Huntington's Disease which runs on her father's side of the family.   Pain Assessment Currently in Pain?: No/denies Pain Score: 0-No pain Pain Location: Shoulder Pain Orientation: Right;Left Pain Type: Acute pain Pain Onset: More than a month ago  Precautions/Restrictions  Precautions Precautions: Fall  Prior Functioning  Home Living Lives With: Son;Family (mother and 89 y.o. son) Receives Help From: Family (mother) Type of Home: House Home Layout: One level Home Access: Stairs to enter Entrance Stairs-Rails: None Entrance Stairs-Number of Steps: 1 Additional Comments: has not started to use a cane or a RW yet.    Cognition/Observation Cognition Comments: delayed processing, difficulty with complex commands, decreased STM Observation/Other Assessments Observations: on both of the patient's shoes the toe is rubbed raw indicating that she catches both of her toes on things many times to display this wear pattern.   Assessment RLE Strength Right Hip Flexion: 4/5 Right Hip Extension: 4/5 Right Hip ABduction: 4/5 Right Hip ADduction: 4/5 Right Knee Flexion: 5/5 Right Knee  Extension: 4/5 Right Ankle Dorsiflexion: 4/5 Right Ankle Plantar Flexion: 5/5 LLE Strength Left Hip Flexion: 4/5 Left Hip Extension: 4/5 Left Hip ABduction: 4/5 Left Hip ADduction: 4/5 Left Knee Flexion: 5/5 Left Knee Extension: 4/5 Left Ankle Dorsiflexion: 4/5 Left Ankle Plantar Flexion: 5/5  Exercise/Treatments Mobility/Balance  Ambulation/Gait Ambulation/Gait: Yes Ambulation/Gait Assistance: 5: Supervision Assistive device: None Gait Pattern: Shuffle Stairs: Yes Stairs Assistance: 5: Supervision Stair Management Technique: Alternating pattern;Two rails;Forwards Number of Stairs: 10  Height of Stairs: 4  (4-6", stairs done in the department)  Sharlene Motts Balance Test Sit to Stand: Able to stand without using hands and stabilize independently Standing Unsupported: Able to stand safely 2 minutes Sitting with Back Unsupported but Feet Supported on Floor or Stool: Able to sit safely and securely 2 minutes Stand to Sit: Sits safely with minimal use of hands Transfers: Able to transfer safely, minor use of hands Standing Unsupported with Eyes Closed: Able to stand 10 seconds safely Standing Ubsupported with Feet Together: Able to place feet together independently and stand 1 minute safely From Standing, Reach Forward with Outstretched Arm: Can reach confidently >25 cm (10") From Standing Position, Pick up Object from Floor: Able to pick up shoe safely and easily From Standing Position, Turn to Look Behind Over each Shoulder: Looks behind from both sides and weight shifts well Turn 360 Degrees: Able to turn 360 degrees safely in 4 seconds or less Standing Unsupported, Alternately Place Feet on Step/Stool: Able to complete 4 steps without aid or supervision Standing Unsupported, One Foot in Front: Able to place foot tandem independently and hold 30 seconds Standing on One Leg: Tries to lift leg/unable to hold 3 seconds but remains standing  independently Total Score: 51/56  Dynamic Gait  Index Level Surface: Mild Impairment Change in Gait Speed: Mild Impairment Gait with Horizontal Head Turns: Mild Impairment Gait with Vertical Head Turns: Mild Impairment Gait and Pivot Turn: Mild Impairment Step Over Obstacle: Moderate Impairment Step Around Obstacles: Mild Impairment Steps: Mild Impairment Total Score: 15/24   Physical Therapy Assessment and Plan Clinical Impression Statement: 41 y.o. femlae with Huntinton's Disease presents to [facility] OP PT for balance deficits, falls, and weakness.  She presents with bil leg weakness, decreased balance especially dynamic balance and decreased cognition.  She would benefit from skilled OP PT to increase her leg strength, increase her balance and safety so that she can avoid falls and potential harmful injury.   Rehab Potential: Good PT Frequency: Min 2X/week PT Duration: 8 weeks PT Treatment/Interventions: DME instruction;Gait training;Stair training;Therapeutic activities;Therapeutic exercise;Balance training;Neuromuscular re-education;Functional mobility training;Cognitive remediation;Patient/family education PT Plan: Continue 2 xs/wk x 8 weeks, give HEP and review with patient AND mother, Victoria Bible.  Add tandem walking, side stepping, retro gait, SLS, heel/toe raises, standing on foam feet together (eyes open, eyes closed), warm up with bike, add stepping over obstacles.     Goals Home Exercise Program Pt will Perform Home Exercise Program: Independently PT Short Term Goals Time to Complete Short Term Goals: 4 weeks PT Short Term Goal 1: The patient and her mom will report less than or equal to 3 stumbles per day to show improved balance at home.   PT Short Term Goal 2: The patient and her mother will report no falls to the ground for the past two weeks to show improved balance and decreased falls.   PT Short Term Goal 3: The patietn will improve leg strength by 1/2 muscle grade to show improved strength.   PT Short Term Goal 4: The patient  will increase DGI by 2 points indicating decreasead fall risk.   PT Short Term Goal 5: The patient will increase her Berg score by 2 points to indicate decreased fall risk.  PT Long Term Goals Time to Complete Long Term Goals: 8 weeks PT Long Term Goal 1: The patient will demonstrate 5/5 leg strength bil to improve her ability to perform stairs reciprocally without rails.   PT Long Term Goal 2: The patient and her mother will report no falls for 3 weeks to demonstrate increased balance.   Long Term Goal 3: The patient will increase her Berg score to 56/56 to show significant improvement in her balance and decreased risk of falls.   Long Term Goal 4: The patient will increase her DGI to  greater than or equal to 19 to show significant improvement and decreased risk of falls.  PT Long Term Goal 5: The patient will be able to go up and down 11 stairs reciprocally without rails and without LOB to demonstrate improved strength, coordination and balance.    Problem List Patient Active Problem List  Diagnoses  . DIABETES MELLITUS  . DYSLIPIDEMIA  . OBESITY, UNSPECIFIED  . ANEMIA, IRON DEFICIENCY  . 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 HUNTINGTON'S DISEASE  . Unspecified Visual Loss  . ESSENTIAL HYPERTENSION, BENIGN  . ALLERGIC RHINITIS, SEASONAL  . ACUTE CYSTITIS  . DERMATITIS  . Pain in joint, pelvic region and thigh  . KNEE PAIN, RIGHT  . BACK PAIN, LUMBAR  . BURSITIS, HIP  . FATIGUE  . HEADACHE  . NECK PAIN  . ONYCHOMYCOSIS  . Balance problems  . Falls  . Lack of coordination  .  Muscle weakness (generalized)   PT - End of Session Equipment Utilized During Treatment: Gait belt Activity Tolerance: Patient tolerated treatment well General Behavior During Session: [facility] for tasks performed Cognition: Impaired Cognitive Impairment: decreased processing speed, decreased STM, decreased ability to follow complex commands, decreased sustained attention in a busy environment.   PT Plan  of Care PT Home Exercise Plan: see scanned report.   Consulted and Agree with Plan of Care:  (will give to patient next session)  Lurena Joiner B. Elim Economou, PT, DPT  08/03/2011, 9:11 PM  Physician Documentation Your signature is required to indicate approval of the treatment plan as stated above.  Please sign and either send electronically or make a copy of this report for your files and return this physician signed original.   Please mark one 1.__approve of plan  2. ___approve of plan with the following conditions.   ______________________________                                                          _____________________ Physician Signature                                                                                                             Date

## 2011-08-03 NOTE — Progress Notes (Signed)
Occupational Therapy Treatment  Patient Details  Name: Victoria [name] MRN: [id number] Date of Birth: 12/21/1970  Today's Date: 08/03/2011 Time: 4098-1191 Time Calculation (min): 40 min  Visit#: 1  of 8   Re-eval: 08/31/11 Manual Therapy [phone] 15' Therapeutic Exercise/Neuro re-ed  [phone] 24'  Subjective Symptoms/Limitations Symptoms: OT:S:  My shoulder hurts. Pain Assessment Currently in Pain?: Yes Pain Score:   8 Pain Location: Shoulder Pain Orientation: Right;Left Pain Type: Acute pain Pain Onset: More than a month ago  Precautions/Restrictions     Exercise/Treatments Supine Protraction: PROM;10 reps Horizontal ABduction: PROM;10 reps External Rotation: PROM;10 reps Internal Rotation: PROM;10 reps Flexion: PROM;10 reps ABduction: PROM;10 reps ROM / Strengthening / Isometric Strengthening UBE (Upper Arm Bike): 3' forward 3' backward 1.5   Neurological Re-education Neuro treatment consisted of patient completing LE dsg, donning/doffing socks and shoes, tying shoes.  Education to mom to let patient complete as much of her adl's as possible.  Occupational Therapy Assessment and Plan OT Assessment and Plan Clinical Impression Statement: A:  Patient with moderate difficulty with simple tasks such as lying down and donning/doffing socks shoes.  Patient professed she could not tie her shoes but completed the task after a few tries.  Patient's pain decreased to 5/10 after treatment. Rehab Potential: Good OT Frequency: Min 2X/week OT Duration: 4 weeks OT Treatment/Interventions: Self-care/ADL training;Therapeutic exercise;Neuromuscular education;Therapeutic activities;Modalities;Manual therapy;DME and/or AE instruction;Cognitive remediation/compensation;Patient/family education OT Plan: P:  Continue with treatment to increase independence with ADL's and caregiver education and increase AROM and decrease pain.   Goals Long Term Goals Time to Complete Long Term Goals: 8  weeks Long Term Goal 1: Pt/caregiver will demonstrate independence with HEP for UE's Long Term Goal 1 Progress: Progressing toward goal Long Term Goal 2: [name] caregiver will verbalize understanding of DME/AE and modified techniques to maximize pt's safety and independence with ADL's Long Term Goal 2 Progress: Progressing toward goal Long Term Goal 3: Pt will report bilateral UE shoulder pain less than or equal to 5/10 with performance of HEP/ADL's Long Term Goal 3 Progress: Progressing toward goal  Problem List Patient Active Problem List  Diagnoses  . DIABETES MELLITUS  . DYSLIPIDEMIA  . OBESITY, UNSPECIFIED  . ANEMIA, IRON DEFICIENCY  . 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 HUNTINGTON'S DISEASE  . Unspecified Visual Loss  . ESSENTIAL HYPERTENSION, BENIGN  . ALLERGIC RHINITIS, SEASONAL  . ACUTE CYSTITIS  . DERMATITIS  . Pain in joint, pelvic region and thigh  . KNEE PAIN, RIGHT  . BACK PAIN, LUMBAR  . BURSITIS, HIP  . FATIGUE  . HEADACHE  . NECK PAIN  . ONYCHOMYCOSIS    End of Session Activity Tolerance: Patient tolerated treatment well General Behavior During Session: [facility] for tasks performed Cognition: [facility] for tasks performed   Victoria [name] L. Noralee Stain, COTA/L  08/03/2011, 5:41 PM

## 2011-08-08 ENCOUNTER — Ambulatory Visit (HOSPITAL_COMMUNITY)
Admission: RE | Admit: 2011-08-08 | Discharge: 2011-08-08 | Disposition: A | Discharge status: Home / Self Care | Payer: Medicare Other | Source: Ambulatory Visit | Attending: Psychiatry | Admitting: Psychiatry

## 2011-08-08 DIAGNOSIS — M6281 Muscle weakness (generalized): Secondary | ICD-10-CM

## 2011-08-08 DIAGNOSIS — R2689 Other abnormalities of gait and mobility: Secondary | ICD-10-CM

## 2011-08-08 DIAGNOSIS — W19XXXA Unspecified fall, initial encounter: Secondary | ICD-10-CM

## 2011-08-08 DIAGNOSIS — R279 Unspecified lack of coordination: Secondary | ICD-10-CM

## 2011-08-08 NOTE — Progress Notes (Signed)
Occupational Therapy Treatment  Patient Details  Name: Victoria [name] MRN: [id number] Date of Birth: 08-03-1970  Today's Date: 08/08/2011 Time: 4098-1191 Therapeutic Exercise [id number] 10' Therapeutic Activity  [phone]  30' Time Calculation (min): 40 min  Visit#: 2  of 8   Re-eval: 08/31/11    Subjective Symptoms/Limitations Symptoms: "She still complains with that arm but has not fallen lately" Pain Assessment Currently in Pain?: Yes Pain Score:   8 Pain Location: Shoulder Pain Orientation: Right Pain Type: Acute pain Pain Onset: More than a month ago  Precautions/Restrictions Cognitive deficits, Apraxia    Exercise/Treatments Supine Protraction: AAROM;10 reps Horizontal ABduction: AAROM;10 reps External Rotation: AROM;10 reps Internal Rotation: AAROM;10 reps Flexion: AAROM;10 reps ABduction: AAROM;10 reps Seated Retraction: AAROM;15 reps Row: AAROM;15 reps Protraction: AAROM;15 reps Horizontal ABduction: AAROM;15 reps Flexion: AROM;20 reps;AAROM Abduction: AROM;15 reps;AAROM   Therapy Ball Flexion: 15 reps ABduction: 15 reps ROM / Strengthening / Isometric Strengthening UBE (Upper Arm Bike): 3' forward 3' backward 1.5   Stretches Table Stretch - Flexion: 10 seconds Table Stretch - Abduction: 10 seconds     Manual Therapy Manual Therapy: Myofascial release Massage: Upper traps Myofascial Release: Upper traps to decrease restrictions and relax shoulder muscles.  Occupational Therapy Assessment and Plan OT Assessment and Plan Clinical Impression Statement: A: Patient had 8/10 pain but responded quickly to MFR technique and massage. She tenses up with PROM or AAROM led by Therapist. Pateint does very nicely with simple activities and can follow movements of therapist using a cane, therapy ball etc. Rehab Potential: Good OT Frequency: Min 2X/week OT Duration: 4 weeks OT Treatment/Interventions: Self-care/ADL training;Therapeutic exercise;Therapeutic  activities;Modalities;Manual therapy;Patient/family education;DME and/or AE instruction OT Plan: P: Continue with traetment giving [place] activities she can replicate and finds fun ie ball catch, ballon batting, imitation of movement.   Goals Home Exercise Program Pt will Perform Home Exercise Program: Independently Short Term Goals Time to Complete Short Term Goals: 2 weeks Short Term Goal 1: Decrase Right Shoulder pain from8/10-0-1/10 Short Term Goal 2: Patient / caregiver training techniqes to decrease Right shoulder pain. Short Term Goal 3: Patient able to participate in exercises and activities to increase BUE shoulder mobility given verbal cues, demonstration and props. Long Term Goals Time to Complete Long Term Goals: 8 weeks Long Term Goal 1: Pt/caregiver will demonstrate independence with HEP for UE's Long Term Goal 2: [name] caregiver will verbalize understanding of DME/AE and modified techniques to maximize pt's safety and independence with ADL's Long Term Goal 3: Pt will report bilateral UE shoulder pain less than or equal to 5/10 with performance of HEP/ADL's  Problem List Patient Active Problem List  Diagnoses  . DIABETES MELLITUS  . DYSLIPIDEMIA  . OBESITY, UNSPECIFIED  . ANEMIA, IRON DEFICIENCY  . 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 HUNTINGTON'S DISEASE  . Unspecified Visual Loss  . ESSENTIAL HYPERTENSION, BENIGN  . ALLERGIC RHINITIS, SEASONAL  . ACUTE CYSTITIS  . DERMATITIS  . Pain in joint, pelvic region and thigh  . KNEE PAIN, RIGHT  . BACK PAIN, LUMBAR  . BURSITIS, HIP  . FATIGUE  . HEADACHE  . NECK PAIN  . ONYCHOMYCOSIS  . Balance problems  . Falls  . Lack of coordination  . Muscle weakness (generalized)    End of Session Equipment Utilized During Treatment: TX ball, cane, soccer ball, UBE Activity Tolerance: Patient tolerated treatment well General Behavior During Session: [facility] for tasks performed Cognition: [facility] for tasks performed OT Plan of  Care OT Home Exercise Plan:  Educated Elajah's mom on how to best keep her arms moving at home. Consulted and Agree with Plan of Care: Patient;Family member/caregiver Family Member Consulted: Mom   Lisa Roca OTR/L 08/08/2011, 4:15 PM

## 2011-08-10 ENCOUNTER — Ambulatory Visit (HOSPITAL_COMMUNITY)
Admission: RE | Admit: 2011-08-10 | Discharge: 2011-08-10 | Disposition: A | Discharge status: Home / Self Care | Payer: Medicare Other | Source: Ambulatory Visit | Attending: Family Medicine | Admitting: Family Medicine

## 2011-08-10 DIAGNOSIS — W19XXXA Unspecified fall, initial encounter: Secondary | ICD-10-CM

## 2011-08-10 DIAGNOSIS — R279 Unspecified lack of coordination: Secondary | ICD-10-CM

## 2011-08-10 DIAGNOSIS — M6281 Muscle weakness (generalized): Secondary | ICD-10-CM

## 2011-08-10 DIAGNOSIS — R2689 Other abnormalities of gait and mobility: Secondary | ICD-10-CM

## 2011-08-10 NOTE — Progress Notes (Signed)
Physical Therapy Treatment Patient Details  Name: Victoria [name] MRN: [id number] Date of Birth: Mar 27, 1971  Today's Date: 08/10/2011 Time: 4098-1191 Time Calculation (min): 42 min Visit#: 2  of 16   Re-eval: 09/02/11  Charge: therex 13 min NMR 23 min   Subjective: Symptoms/Limitations Symptoms: PT S: No pain today Pain Assessment Currently in Pain?: No/denies  Objective;   Exercise/Treatments Balance Exercises Stationary Bike: 8' @ 2.0  Sidestepping: 2 round trips Tandem Walking: 2 round trips Retro Gait: 2 round trips Step Over Cones (Round Trips): 2 RT over hurdles 6 and 12 in tandem pattern Single Limb Stance: Right;Left;Limitations Single Limb Stance Limitations: L 10" R 34" with min assistance 2 max Stand without Upper Extremity Support: 1' feet side by side on foam, 3x 15" on foam with eyes closed on foam Heel Raises: 15 reps Toe Raise: 15 reps;Limitations Toe Raise Limitations: seated DF with blue tband  Physical Therapy Assessment and Plan PT Assessment and Plan Clinical Impression Statement: Began therex/neuro re-ed per PT plan, pt required multimodal cueing to complete tasks, demonstration most effective cueing.   Pt/mother given copy of HEP, need to review at beginning of next session (heel/toe raises, SLS, tandem stance) PT Plan: Progress to more dynamic balance surfaces as ready    Goals    Problem List Patient Active Problem List  Diagnoses  . DIABETES MELLITUS  . DYSLIPIDEMIA  . OBESITY, UNSPECIFIED  . ANEMIA, IRON DEFICIENCY  . 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 HUNTINGTON'S DISEASE  . Unspecified Visual Loss  . ESSENTIAL HYPERTENSION, BENIGN  . ALLERGIC RHINITIS, SEASONAL  . ACUTE CYSTITIS  . DERMATITIS  . Pain in joint, pelvic region and thigh  . KNEE PAIN, RIGHT  . BACK PAIN, LUMBAR  . BURSITIS, HIP  . FATIGUE  . HEADACHE  . NECK PAIN  . ONYCHOMYCOSIS  . Balance problems  . Falls  . Lack of coordination  . Muscle weakness  (generalized)    PT - End of Session Equipment Utilized During Treatment: Gait belt Activity Tolerance: Patient tolerated treatment well General Behavior During Session: [facility] for tasks performed Cognition: [facility] for tasks performed   Juel Burrow, PTA 08/10/2011, 3:42 PM

## 2011-08-10 NOTE — Progress Notes (Signed)
Occupational Therapy Treatment  Patient Details  Name: Victoria [name] MRN: [id number] Date of Birth: 1971-04-26  Today's Date: 08/10/2011 Time: 4098-1191 Time Calculation (min): 43 min  Visit#: 3  of 8   Re-eval: 08/31/11    Subjective Symptoms/Limitations Symptoms: OT:  S: My shoulder feels much better. Pain Assessment Currently in Pain?: No/denies  Precautions/Restrictions     Exercise/Treatments   Hand Exercises Theraputty - Flatten: pink Theraputty - Roll: pink Theraputty - Grip: pink Theraputty - Pinch: pink Theraputty - Locate Pegs: x10  Neurological Re-education Exercises Shoulder Flexion: AROM;15 reps (w/ green ball to throw overhead, lift from floor to overhead)  Weight Bearing Exercises Standing with weight shifting on and off: when flattening putty Standing; Reaching to the floor: for green ball to throw Standing; Reaching overhead: with green ball overhead  Development of Reach  Development of Reach:  (throwing large green ball.)  Grasp and Release Grasp and Release: Theraputty Theraputty - Flatten: pink Theraputty - Roll: pink Theraputty - Grip: pink Theraputty - Pinch: pink Theraputty - Locate Pegs: x10   Occupational Therapy Assessment and Plan OT Assessment and Plan Clinical Impression Statement: OT: P:Patient had moderate difficulty grasping the concept of flattening, rolling and pinching and finding pegs in putty Rehab Potential: Good OT Plan: OT:  P: Increase participation in ADL's and IADL"s   Goals Home Exercise Program Pt will Perform Home Exercise Program: Independently Short Term Goals Time to Complete Short Term Goals: 2 weeks Short Term Goal 1: Decrase Right Shoulder pain from8/10-0-1/10 Short Term Goal 2: Patient / caregiver training techniqes to decrease Right shoulder pain. Short Term Goal 3: Patient able to participate in exercises and activities to increase BUE shoulder mobility given verbal cues, demonstration and  props. Long Term Goals Time to Complete Long Term Goals: 8 weeks Long Term Goal 1: Pt/caregiver will demonstrate independence with HEP for UE's Long Term Goal 2: [name] caregiver will verbalize understanding of DME/AE and modified techniques to maximize pt's safety and independence with ADL's Long Term Goal 3: Pt will report bilateral UE shoulder pain less than or equal to 5/10 with performance of HEP/ADL's  Problem List Patient Active Problem List  Diagnoses  . DIABETES MELLITUS  . DYSLIPIDEMIA  . OBESITY, UNSPECIFIED  . ANEMIA, IRON DEFICIENCY  . 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 HUNTINGTON'S DISEASE  . Unspecified Visual Loss  . ESSENTIAL HYPERTENSION, BENIGN  . ALLERGIC RHINITIS, SEASONAL  . ACUTE CYSTITIS  . DERMATITIS  . Pain in joint, pelvic region and thigh  . KNEE PAIN, RIGHT  . BACK PAIN, LUMBAR  . BURSITIS, HIP  . FATIGUE  . HEADACHE  . NECK PAIN  . ONYCHOMYCOSIS  . Balance problems  . Falls  . Lack of coordination  . Muscle weakness (generalized)    End of Session Activity Tolerance: Patient tolerated treatment well General Behavior During Session: [facility] for tasks performed Cognition: [facility] for tasks performed  GO No functional reporting required  Jannice Beitzel L. Noralee Stain, COTA/L  08/10/2011, 6:16 PM

## 2011-08-11 DIAGNOSIS — F0281 Dementia in other diseases classified elsewhere with behavioral disturbance: Secondary | ICD-10-CM | POA: Diagnosis not present

## 2011-08-16 ENCOUNTER — Ambulatory Visit (HOSPITAL_COMMUNITY)
Admission: RE | Admit: 2011-08-16 | Discharge: 2011-08-16 | Disposition: A | Discharge status: Home / Self Care | Payer: Medicare Other | Source: Ambulatory Visit | Attending: Family Medicine | Admitting: Family Medicine

## 2011-08-16 DIAGNOSIS — M6281 Muscle weakness (generalized): Secondary | ICD-10-CM

## 2011-08-16 DIAGNOSIS — W19XXXA Unspecified fall, initial encounter: Secondary | ICD-10-CM

## 2011-08-16 DIAGNOSIS — R2689 Other abnormalities of gait and mobility: Secondary | ICD-10-CM

## 2011-08-16 DIAGNOSIS — R279 Unspecified lack of coordination: Secondary | ICD-10-CM

## 2011-08-16 NOTE — Progress Notes (Signed)
Occupational Therapy Treatment  Patient Details  Name: LAVANNA ROG MRN: [id number] Date of Birth: 01-01-1971  Today's Date: 08/16/2011 Time: 7829-5621 Time Calculation (min): 36 min  Visit#: 4  of 8   Re-eval: 08/31/11 Neuro re-ed 36'    Subjective Symptoms/Limitations Symptoms: S: I can't get this shoe tied. Pain Assessment Currently in Pain?: No/denies  Precautions/Restrictions     Exercise/Treatments ROM / Strengthening / Isometric Strengthening UBE (Upper Arm Bike): 3' forward 3' backward 2.5    08/16/11 1500  Weight Bearing Exercises  Standing with weight shifting on and off when flattening putty  Standing; Reaching to the floor for green ball to throw  Standing; Reaching overhead with green ball overhead  Grasp and Release  Grasp and Release Theraputty  Theraputty - Flatten pink  Theraputty - Roll pink  Theraputty - Grip pink  Theraputty - Pinch pink  Theraputty - Locate Pegs x10  Fine Motor Coordination  Fine Motor Coordination (tying shoes.)                   Occupational Therapy Assessment and Plan OT Assessment and Plan Clinical Impression Statement: OT: P:  Multiple attempts to tie shoe, tied one but could not do the other, could not remember how to make a bow. Rehab Potential: Good OT Plan: OT:  P: Complete some higher level ADL's   Goals Home Exercise Program Pt will Perform Home Exercise Program: Independently Short Term Goals Time to Complete Short Term Goals: 2 weeks Short Term Goal 1: Decrase Right Shoulder pain from8/10-0-1/10 Short Term Goal 2: Patient / caregiver training techniqes to decrease Right shoulder pain. Short Term Goal 3: Patient able to participate in exercises and activities to increase BUE shoulder mobility given verbal cues, demonstration and props. Long Term Goals Time to Complete Long Term Goals: 8 weeks Long Term Goal 1: Pt/caregiver will demonstrate independence with HEP for UE's Long Term Goal 2: [name]  caregiver will verbalize understanding of DME/AE and modified techniques to maximize pt's safety and independence with ADL's Long Term Goal 3: Pt will report bilateral UE shoulder pain less than or equal to 5/10 with performance of HEP/ADL's  Problem List Patient Active Problem List  Diagnoses  . DIABETES MELLITUS  . DYSLIPIDEMIA  . OBESITY, UNSPECIFIED  . ANEMIA, IRON DEFICIENCY  . 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 HUNTINGTON'S DISEASE  . Unspecified Visual Loss  . ESSENTIAL HYPERTENSION, BENIGN  . ALLERGIC RHINITIS, SEASONAL  . ACUTE CYSTITIS  . DERMATITIS  . Pain in joint, pelvic region and thigh  . KNEE PAIN, RIGHT  . BACK PAIN, LUMBAR  . BURSITIS, HIP  . FATIGUE  . HEADACHE  . NECK PAIN  . ONYCHOMYCOSIS  . Balance problems  . Falls  . Lack of coordination  . Muscle weakness (generalized)    End of Session Activity Tolerance: Patient tolerated treatment well General Behavior During Session: [facility] for tasks performed Cognition: [facility] for tasks performed  GO No functional reporting required  Triston Lisanti L. Noralee Stain, COTA/L  08/16/2011, 6:09 PM

## 2011-08-16 NOTE — Progress Notes (Signed)
Physical Therapy Treatment Patient Details  Name: OAKLEE ESTHER MRN: [id number] Date of Birth: [birth year]  Today's Date: 08/16/2011 Time: 7829-5621 Time Calculation (min): 38 min Visit#: 3  of 16   Re-eval: 09/02/11  Charge: NMR 30 min  Subjective: Symptoms/Limitations Symptoms: PT S: Pt. 10 min late for appt.  No pain.  Mother stated they were busy and have not reviewed the HEP.  Pain Assessment Currently in Pain?: No/denies  Objective:   Exercise/Treatments Balance Exercises Stationary Bike: 8' @ 3.5 Tandem Walking: 2 round trips Retro Gait: 2 round trips Step Over Cones (Round Trips): 2 RT over hurdles 6 and 12 in tandem pattern Single Limb Stance Limitations: L 11"; R 36"  Stand without Upper Extremity Support: 1' feet side by side on foam, 3x 15" on foam with eyes closed on foam Heel Raises: 15 reps Toe Raise: 15 reps Balance Beam: 2 RT  Physical Therapy Assessment and Plan PT Assessment and Plan Clinical Impression Statement: Reviewed HEP, pt required multimodal cueing to complete correctly.  Pt able to regain balance with min assistance with max cueing for body awareness, pt with tendency to lean L but unaware of lean.   PT Plan: Continue to progress dynamic balance and self awareness of body placement in space.      Goals    Problem List Patient Active Problem List  Diagnoses  . DIABETES MELLITUS  . DYSLIPIDEMIA  . OBESITY, UNSPECIFIED  . ANEMIA, IRON DEFICIENCY  . 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 HUNTINGTON'S DISEASE  . Unspecified Visual Loss  . ESSENTIAL HYPERTENSION, BENIGN  . ALLERGIC RHINITIS, SEASONAL  . ACUTE CYSTITIS  . DERMATITIS  . Pain in joint, pelvic region and thigh  . KNEE PAIN, RIGHT  . BACK PAIN, LUMBAR  . BURSITIS, HIP  . FATIGUE  . HEADACHE  . NECK PAIN  . ONYCHOMYCOSIS  . Balance problems  . Falls  . Lack of coordination  . Muscle weakness (generalized)    PT - End of Session Equipment Utilized During Treatment:  Gait belt Activity Tolerance: Patient tolerated treatment well General Behavior During Session: [facility] for tasks performed Cognition: [facility] for tasks performed  Juel Burrow, PTA 08/16/2011, 3:22 PM

## 2011-08-18 ENCOUNTER — Ambulatory Visit (HOSPITAL_COMMUNITY)
Admission: RE | Admit: 2011-08-18 | Discharge: 2011-08-18 | Disposition: A | Discharge status: Home / Self Care | Payer: Medicare Other | Source: Ambulatory Visit | Attending: Family Medicine | Admitting: Family Medicine

## 2011-08-18 ENCOUNTER — Ambulatory Visit (HOSPITAL_COMMUNITY): Payer: Medicare Other | Admitting: Physical Therapy

## 2011-08-18 ENCOUNTER — Ambulatory Visit (HOSPITAL_COMMUNITY)
Admission: RE | Admit: 2011-08-18 | Discharge: 2011-08-18 | Disposition: A | Discharge status: Home / Self Care | Payer: Medicare Other | Source: Ambulatory Visit | Attending: Psychiatry | Admitting: Psychiatry

## 2011-08-18 NOTE — Evaluation (Signed)
Speech Language Pathology Evaluation Patient Details  Name: Victoria [name] MRN: [id number] Date of Birth: 1970/11/20 Visit #: 1  Today's Date: 08/18/2011 Time: 4098-1191 Time Calculation (min): 48 min  Past Medical History:  Past Medical History  Diagnosis Date  . Hypertension   . Obesity   . Dyslipidemia   . Mild cognitive impairment   . Perennial allergic rhinitis   . Diabetes mellitus   . Anxiety   . Huntington's disease    Past Surgical History: No past surgical history on file. HPI:  Symptoms/Limitations Symptoms: Ms. Victoria [name] was accompanied by her mother for the speech/language evaluation. Her mother reports that "Victoria [name]" has a loud swallow. Special Tests: Informal measures Pain Assessment Currently in Pain?: No/denies Pain Score: 0-No pain  Prior Functional Status  Cognitive/Linguistic Baseline: Baseline deficits Baseline deficit details: Memory impairment in setting of atypical Huntington's Disease diagnosed ~5 years ago. 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)  Cognition  Overall Cognitive Status: Impaired at baseline Arousal/Alertness: Awake/alert Orientation Level: Oriented to person;Disoriented to time Attention: Sustained Sustained Attention: Impaired Sustained Attention Impairment: Verbal complex;Functional complex Memory: Impaired Memory Impairment: Storage deficit;Decreased recall of new information;Retrieval deficit;Prospective memory;Decreased short term memory Decreased Short Term Memory: Verbal basic Awareness: Impaired Awareness Impairment: Emergent impairment Problem Solving: Impaired Problem Solving Impairment: Verbal complex;Functional complex Executive Function: Sequencing;Organizing;Decision Building surveyor: Impaired Sequencing  Impairment: Verbal complex;Functional complex Organizing: Impaired Organizing Impairment: Verbal complex;Functional complex Decision Making: Impaired Decision Making Impairment: Verbal complex;Functional complex Self Monitoring: Impaired Self Monitoring Impairment: Verbal complex;Functional complex Safety/Judgment: Impaired (Mother rarely leaves pt home alone now)  Comprehension  Auditory Comprehension Overall Auditory Comprehension: Impaired Yes/No Questions: Impaired Complex Questions: 50-74% accurate Commands: Impaired Multistep Basic Commands: 25-49% accurate Conversation: Complex Other Conversation Comments: Delayed processing, decreased working memory Interfering Components: Attention;Processing speed;Working Radio broadcast assistant: Optometrist Discrimination: Exceptions to [employer] Drawings: Able in field of 3 (Some difficulty discriminating object) Reading Comprehension Reading Status: Impaired Word level: Within functional limits Sentence Level: Impaired Interfering Components: Attention;Visual scanning;Working Civil Service fast streamer;Eye glasses not available;Visual acuity Effective Techniques: Large print;Visual cueing  Expression  Expression Primary Mode of Expression: Verbal Verbal Expression Overall Verbal Expression: Impaired Initiation: Impaired Automatic Speech: Day of week;Month of year (3/7 DOW, 5/12 MOY) Level of Generative/Spontaneous Verbalization: Conversation Repetition: Impaired Level of Impairment: Sentence level Naming: Impairment Responsive: 51-75% accurate Confrontation: Impaired Black/White Line Drawings: Able in field of 3 (Some difficulty discriminating object) Convergent: Not tested Divergent: 25-49% accurate Pragmatics: No impairment (At first seems to have flat affect, but smiled and engaged m) Interfering Components: Speech  intelligibility;Attention Effective Techniques: Open ended questions;Articulatory cues;Sentence completion Non-Verbal Means of Communication: Not applicable Written Expression Written Expression: Not tested  Oral/Motor  Oral Motor/Sensory Function Overall Oral Motor/Sensory Function: Impaired Labial ROM: Within Functional Limits Labial Symmetry: Within Functional Limits Labial Strength: Reduced Labial Sensation: Within Functional Limits Lingual ROM: Reduced right;Reduced left Lingual Symmetry:  (Decreased coordination, fasiculations) Lingual Strength: Reduced Facial ROM: Within Functional Limits Facial Symmetry: Within Functional Limits Velum: Within Functional Limits Mandible:  (seems mildly reduced) Motor Speech Overall Motor Speech: Impaired Phonation: Low vocal intensity Resonance: Within functional limits Articulation: Impaired Level of Impairment: Phrase Intelligibility: Intelligibility reduced Word: 75-100% accurate Phrase: 75-100% accurate Sentence: 50-74% accurate Conversation: 25-49% accurate Motor Planning: Witnin functional limits Effective Techniques: Slow rate  SLP Goals  Home Exercise SLP Goal: Patient will Perform Home Exercise Program: with supervision, verbal cues required/provided SLP Short Term Goals SLP Short Term Goal 1: Pt will implement speech intelligibility strategies in guided sentence repetition with 90% intelligibility and mod/max cues. SLP Short Term Goal 2: Pt will decrease rate and increase vocal intensity in 5 minute conversation with mod/max cues throughout. SLP Short Term Goal 3: Pt will complete labio-lingual OMEs x 10 each with written cue provided and moderate verbal cues. SLP Short Term Goal 4: Pt will consume regular textures and thin liquids with minimal signs/symptoms of aspiration with use of compensatory strategies. SLP Long Term Goals SLP Long Term Goal 1: Pt will increase safetly with po at home with use of compensatory  strategies as needed. SLP Long Term Goal 2: Pt will increase speech intelligibility for short conversations with family and medical team to mild impairment with use of compensatory strategies and verbal cues.  SLP Long Term Goal 3: Provide pt/family education on effective compensatory strategies for memory in home environment.  Assessment/Plan  Patient Active Problem List  Diagnoses  . DIABETES MELLITUS  . DYSLIPIDEMIA  . OBESITY, UNSPECIFIED  . ANEMIA, IRON DEFICIENCY  . 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 HUNTINGTON'S DISEASE  . Unspecified Visual Loss  . ESSENTIAL HYPERTENSION, BENIGN  . ALLERGIC RHINITIS, SEASONAL  . ACUTE CYSTITIS  . DERMATITIS  . Pain in joint, pelvic region and thigh  . KNEE PAIN, RIGHT  . BACK PAIN, LUMBAR  . BURSITIS, HIP  . FATIGUE  . HEADACHE  . NECK PAIN  . ONYCHOMYCOSIS  . Balance problems  . Falls  . Lack of coordination  . Muscle weakness (generalized)   SLP - End of Session Activity Tolerance: Patient tolerated treatment well General Behavior During Session: [facility] for tasks performed Cognition: Impaired  SLP Assessment/Plan Clinical Impression Statement: Ms.Ziona Cabezas is a 41 yo woman who was diagnosed with Huntington's Disease ~5 years ago and was referred by Dr. Linda Hedges for speech evaluation. She was accompanied to today's appointment by her mother who she lives with (along with her 74 yo son) and provided most of the background information. Her mother has concerns about her daughter's "loud swallow" and decreased speech intelligibility and word-finding. She is unable to be left alone at home much anymore due to concerns for her safety (decreased judgement and memory). She mostly spends her day watching television or running errands with her mother. Victoria [name] presents with moderate cognitive linguistic deficits characterized by dysarthria, decreased word-finding, and poor memory and attention skills. Victoria [name] does have an audible (loud)  swallow inconsistently but tolerated cup sips of thin liquids today in my office. This will be addressed further to ensure safety with po. Pt would benefit from cognitive-linguistic therapy to teach compensatory strategies, educate pt/family, and develop home program for speech, memory, and swallowing.  Speech Therapy Frequency: min 2x/week Duration: 4 weeks Treatment/Interventions: Diet toleration management by SLP;Compensatory techniques;Oral motor exercises;Functional tasks;Compensatory strategies;SLP instruction and feedback;Patient/family education;Multimodal communcation approach;Cueing hierarchy Potential to Achieve Goals: Good Potential Considerations: Ability to learn/carryover information;Medical prognosis  GN  Functional Reporting Modifier  Current Status  [phone] - Motor Speech CL - At least 60% but less than 80% impaired, limited or restricted  Goal Status   [id number] - Motor Speech CJ - At least 20% but less than 40% impaired, limited or restricted    Thank you for this referral, Havery Moros, CCC-SLP [phone]  Jordin Vicencio 08/18/2011, 6:07 PM  Physician Documentation Your signature is required to  indicate approval of the treatment plan as stated above.  Please sign and either send electronically or make a copy of this report for your files and return this physician signed original.  Please mark one 1.__approve of plan  2. ___approve of plan with the following conditions.   ______________________________                                                          _____________________ Physician Signature                                                                                                             Date

## 2011-08-18 NOTE — Progress Notes (Signed)
Physical Therapy Treatment Patient Details  Name: Victoria [name] MRN: [id number] Date of Birth: [birth year]  Today's Date: 08/18/2011 Time: 4098-1191 Time Calculation (min): 35 min Visit#: 4  of 16   Re-eval: 09/02/11 Charges:  NMR  25', therex 10'    Subjective: Symptoms/Limitations Symptoms: Pt. has no complaints today. Pain Assessment Currently in Pain?: No/denies   Exercise/Treatments Balance Exercises Stationary Bike: 8' @ 3.5 Sidestepping: 2 round trips;Limitations Sidestepping Limitations: on balance beam Tandem Walking: 2 round trips Retro Gait: 2 round trips Step Over Cones (Round Trips): 2 RT over hurdles 6 and 12 in tandem pattern Single Limb Stance: Right;Left;Limitations Single Limb Stance Limitations: L 18"; R 36"  Stand without Upper Extremity Support: 1' feet side by side on foam, 3x 15" on foam with eyes closed on foam Heel Raises: 15 reps Toe Raise: 15 reps Balance Beam: 2 RT  Physical Therapy Assessment and Plan PT Assessment and Plan PT Plan: Continue to progress balance.     Problem List Patient Active Problem List  Diagnoses  . DIABETES MELLITUS  . DYSLIPIDEMIA  . OBESITY, UNSPECIFIED  . ANEMIA, IRON DEFICIENCY  . 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 HUNTINGTON'S DISEASE  . Unspecified Visual Loss  . ESSENTIAL HYPERTENSION, BENIGN  . ALLERGIC RHINITIS, SEASONAL  . ACUTE CYSTITIS  . DERMATITIS  . Pain in joint, pelvic region and thigh  . KNEE PAIN, RIGHT  . BACK PAIN, LUMBAR  . BURSITIS, HIP  . FATIGUE  . HEADACHE  . NECK PAIN  . ONYCHOMYCOSIS  . Balance problems  . Falls  . Lack of coordination  . Muscle weakness (generalized)    PT - End of Session Equipment Utilized During Treatment: Gait belt Activity Tolerance: Patient tolerated treatment well General Behavior During Session: [facility] for tasks performed Cognition: [facility] for tasks performed Cognitive Impairment: Pt. able to follow commands with less distraction today.   Tandem gait seems to be the most difficult for patient.     Shaarav Ripple B. Bascom Levels, PTA 08/18/2011, 3:30 PM

## 2011-08-23 ENCOUNTER — Ambulatory Visit (HOSPITAL_COMMUNITY)
Admission: RE | Admit: 2011-08-23 | Discharge: 2011-08-23 | Disposition: A | Discharge status: Home / Self Care | Payer: Medicare Other | Source: Ambulatory Visit | Attending: Family Medicine | Admitting: Family Medicine

## 2011-08-23 DIAGNOSIS — M6281 Muscle weakness (generalized): Secondary | ICD-10-CM

## 2011-08-23 DIAGNOSIS — M25519 Pain in unspecified shoulder: Secondary | ICD-10-CM | POA: Diagnosis not present

## 2011-08-23 DIAGNOSIS — IMO0001 Reserved for inherently not codable concepts without codable children: Secondary | ICD-10-CM | POA: Diagnosis not present

## 2011-08-23 DIAGNOSIS — E119 Type 2 diabetes mellitus without complications: Secondary | ICD-10-CM | POA: Diagnosis not present

## 2011-08-23 DIAGNOSIS — I10 Essential (primary) hypertension: Secondary | ICD-10-CM | POA: Diagnosis not present

## 2011-08-23 DIAGNOSIS — R4789 Other speech disturbances: Secondary | ICD-10-CM | POA: Insufficient documentation

## 2011-08-23 DIAGNOSIS — R279 Unspecified lack of coordination: Secondary | ICD-10-CM

## 2011-08-23 DIAGNOSIS — M25619 Stiffness of unspecified shoulder, not elsewhere classified: Secondary | ICD-10-CM | POA: Diagnosis not present

## 2011-08-23 DIAGNOSIS — E785 Hyperlipidemia, unspecified: Secondary | ICD-10-CM | POA: Insufficient documentation

## 2011-08-23 DIAGNOSIS — R2689 Other abnormalities of gait and mobility: Secondary | ICD-10-CM

## 2011-08-23 DIAGNOSIS — W19XXXA Unspecified fall, initial encounter: Secondary | ICD-10-CM

## 2011-08-23 NOTE — Progress Notes (Signed)
Speech Language Pathology Treatment Patient Details  Name: Victoria [name] MRN: [id number] Date of Birth: 1971/04/29 Visit #: 2  Today's Date: 08/23/2011 Time: 4098-1191 Time Calculation (min): 45 min  HPI:  Symptoms/Limitations Symptoms: Victoria [name] feels pretty good today. Special Tests: n/a Pain Assessment Currently in Pain?: No/denies Pain Score: 0-No pain   Treatment  Cognitive-Linguistic Therapy Dysarthria Therapy Dysphagia Therapy Oral Motor Exercises Patient/Family Education Home Exercise Program  SLP Goals  Home Exercise SLP Goal: Patient will Perform Home Exercise Program: with supervision, verbal cues required/provided SLP Goal: Perform Home Exercise Program - Progress: Progressing toward goal SLP Short Term Goals SLP Short Term Goal 1: Pt will implement speech intelligibility strategies in guided sentence repetition with 90% intelligibility and mod/max cues. SLP Short Term Goal 1 - Progress: Progressing toward goal SLP Short Term Goal 2: Pt will decrease rate and increase vocal intensity in 5 minute conversation with mod/max cues throughout. SLP Short Term Goal 2 - Progress: Progressing toward goal SLP Short Term Goal 3: Pt will complete labio-lingual OMEs x 10 each with written cue provided and moderate verbal cues. SLP Short Term Goal 3 - Progress: Progressing toward goal SLP Short Term Goal 4: Pt will consume regular textures and thin liquids with minimal signs/symptoms of aspiration with use of compensatory strategies. SLP Short Term Goal 4 - Progress: Progressing toward goal SLP Short Term Goal 5: Pt will look to memory book for information when provided min verbal cues. SLP Short Term Goal 5 - Progress: Progressing toward goal SLP Long Term Goals SLP Long Term Goal 1: Pt will increase safetly with po at home with use of compensatory strategies as needed. SLP Long Term Goal 1 - Progress: Progressing toward goal SLP Long Term Goal 2: Pt will increase speech  intelligibility for short conversations with family and medical team to mild impairment with use of compensatory strategies and verbal cues.  SLP Long Term Goal 2 - Progress: Progressing toward goal SLP Long Term Goal 3: Provide pt/family education on effective compensatory strategies for memory in home environment. SLP Long Term Goal 3 - Progress: Progressing toward goal  Assessment/Plan  Patient Active Problem List  Diagnoses  . DIABETES MELLITUS  . DYSLIPIDEMIA  . OBESITY, UNSPECIFIED  . ANEMIA, IRON DEFICIENCY  . 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 HUNTINGTON'S DISEASE  . Unspecified Visual Loss  . ESSENTIAL HYPERTENSION, BENIGN  . ALLERGIC RHINITIS, SEASONAL  . ACUTE CYSTITIS  . DERMATITIS  . Pain in joint, pelvic region and thigh  . KNEE PAIN, RIGHT  . BACK PAIN, LUMBAR  . BURSITIS, HIP  . FATIGUE  . HEADACHE  . NECK PAIN  . ONYCHOMYCOSIS  . Balance problems  . Falls  . Lack of coordination  . Muscle weakness (generalized)   SLP - End of Session Activity Tolerance: Patient tolerated treatment well General Behavior During Session: [facility] for tasks performed Cognition: Impaired  SLP Assessment/Plan Clinical Impression Statement: Victoria [name] was seen after OT and PT today and was accompanied to therapy by her mother. Her mother reported that Victoria [name] falls when she is walking, when she is sitting down, and when she is sleeping. Victoria [name] sleeps in a twin bed and I suggested that they move her bed against the wall and use a styrofoam pool noodle under the fitted sheet on the edge of the bed to prevent falls in the night. I tape recorded Victoria [name] today and her speech intelligibility was actually better than typical (also confirmed by her mother). She was cued to  decrease rate, increase loudness, and to over articulate. It is difficult for her to implement all at the same time. She did best when reciting automatics back and forth with clinician and when cued for strategies. Her mother confirms  that speech intelligibility is a big problem at home and that others frequently have to ask her to repeat herself. I think Victoria [name] would do well to have a memory notebook set up for use going forward as this has not been done with her. Speech Therapy Frequency: min 2x/week Duration: 4 weeks Treatment/Interventions: Diet toleration management by SLP;Compensatory techniques;Oral motor exercises;Functional tasks;Compensatory strategies;SLP instruction and feedback;Patient/family education;Multimodal communcation approach;Cueing hierarchy Potential to Achieve Goals: Good Potential Considerations: Ability to learn/carryover information;Medical prognosis  Pt consumed crackers and thin liquids without incident, although with audible swallow.    GN No functional reporting required  Journie Howson 08/23/2011, 6:07 PM

## 2011-08-23 NOTE — Progress Notes (Signed)
Occupational Therapy Treatment  Patient Details  Name: Victoria [name] MRN: [id number] Date of Birth: 08-May-1971  Today's Date: 08/23/2011 Time: 4098-1191 Time Calculation (min): 41 min  Visit#: 5  of 8   Re-eval: 08/31/11 Neuro re-ed [phone]    Subjective Symptoms/Limitations Symptoms: OT:S: I'm tired. Pain Assessment Currently in Pain?: No/denies  Precautions/Restrictions     Exercise/Treatments Hand Exercises Theraputty - Flatten: pink Theraputty - Roll: pink Theraputty - Grip: pink Theraputty - Pinch: pink Theraputty - Locate Pegs: x10  Cognitive Exercises Sequencing Skills: with making bed Listing: with bed making  Neurological Re-education     Weight Bearing Exercises Standing with weight shifting on and off: when flattening putty  Grasp and Release Grasp and Release: Theraputty Theraputty - Flatten: pink Theraputty - Roll: pink Theraputty - Grip: pink Theraputty - Pinch: pink Theraputty - Locate Pegs: x10   Occupational Therapy Assessment and Plan OT Assessment and Plan Clinical Impression Statement: OT: P:  Worked with patient on making bed and the steps needed.  Patient given 3 step written instruction to post in her room and education to mom how to use memory tool to see if this is a technique that will work on how to remember her tasks to complete and steps for each task to help mom more at home. Rehab Potential: Good OT Plan: OT: P:  Follow up on IADL tasks at home.   Goals Home Exercise Program Pt will Perform Home Exercise Program: Independently  Problem List Patient Active Problem List  Diagnoses  . DIABETES MELLITUS  . DYSLIPIDEMIA  . OBESITY, UNSPECIFIED  . ANEMIA, IRON DEFICIENCY  . 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 HUNTINGTON'S DISEASE  . Unspecified Visual Loss  . ESSENTIAL HYPERTENSION, BENIGN  . ALLERGIC RHINITIS, SEASONAL  . ACUTE CYSTITIS  . DERMATITIS  . Pain in joint, pelvic region and thigh  . KNEE PAIN, RIGHT    . BACK PAIN, LUMBAR  . BURSITIS, HIP  . FATIGUE  . HEADACHE  . NECK PAIN  . ONYCHOMYCOSIS  . Balance problems  . Falls  . Lack of coordination  . Muscle weakness (generalized)    End of Session Activity Tolerance: Patient tolerated treatment well General Behavior During Session: [facility] for tasks performed Cognition: Impaired  GO No functional reporting required  Javion Holmer L. TRUE [place], COTA/L  08/23/2011, 5:30 PM

## 2011-08-23 NOTE — Progress Notes (Signed)
Physical Therapy Treatment Patient Details  Name: Victoria [name] Victoria [name] [name] MRN: [id number] Date of Birth: 1970/12/10  Today's Date: 08/23/2011 Time: 4098-1191 Time Calculation (min): 43 min Charges: 52' NMR, 5' TE Visit#: 5  of 16   Re-eval: 09/02/11    Subjective: Symptoms/Limitations Symptoms: Victoria [name] Victoria [name] [name] reports she continues to drag Victoria [name] feet  Precautions/Restrictions     Exercise/Treatments Machines for Strengthening Cybex Knee Extension: 1 PL 2x10 Cybex Knee Flexion: 2.5 PL 2x10 Standing Functional Squat: 20 reps Stairs: 1 RT w/handrail and CGA w/reciprocal pattern  Other Standing Knee Exercises: Walking around gym with arms above head 2 RT, pre gait: weight shifting with hands from waist to digonal flexion x10 each direction, Hay Bales x10 each direction  Balance Exercises Tread Mill: 5' w/min-mod A and another assistance in front of TM for appropriate posture.  1.2 mph Tandem Walking: Limitations Tandem Walking Limitations: 1/2 RT w/tandem balancing for 10 sec on each foot with CGA to min A Gait with Head Turns (Round Trips): 3 RT, Held to the R and L for 2 RT  Physical Therapy Assessment and Plan PT Assessment and Plan Clinical Impression Statement: Introduced TM walking, pre gait activities to improve pelvic mobility and stair training today to improve balance and functional strength and cybex exercise to improve muscle strength.  She tolerated all new exercises well and overcame Victoria [name] fear of going up and down the stairs with reciprical pattern.  PT Plan: continue to improve Victoria [name] overall functional strength and balance, especially related to the Berg and DGI in order to meet goals.     Goals Home Exercise Program Pt will Perform Home Exercise Program: Independently PT Short Term Goals Time to Complete Short Term Goals: 4 weeks PT Short Term Goal 1: The patient and Victoria [name] mom will report less than or equal to 3 stumbles per day to show improved balance at home.   PT Short Term  Goal 1 - Progress: Progressing toward goal PT Short Term Goal 2: The patient and Victoria [name] Victoria [name] [name] will report no falls to the ground for the past two weeks to show improved balance and decreased falls.   PT Short Term Goal 2 - Progress: Progressing toward goal PT Short Term Goal 3: The patietn will improve leg strength by 1/2 muscle grade to show improved strength.   PT Short Term Goal 3 - Progress: Progressing toward goal PT Short Term Goal 4: The patient will increase DGI by 2 points indicating decreasead fall risk.   PT Short Term Goal 4 - Progress: Progressing toward goal PT Short Term Goal 5: The patient will increase Victoria [name] Berg score by 2 points to indicate decreased fall risk.  PT Short Term Goal 5 - Progress: Progressing toward goal PT Long Term Goals Time to Complete Long Term Goals: 8 weeks PT Long Term Goal 1: The patient will demonstrate 5/5 leg strength bil to improve Victoria [name] ability to perform stairs reciprocally without rails.   PT Long Term Goal 1 - Progress: Progressing toward goal PT Long Term Goal 2: The patient and Victoria [name] Victoria [name] [name] will report no falls for 3 weeks to demonstrate increased balance.   PT Long Term Goal 2 - Progress: Progressing toward goal Long Term Goal 3: The patient will increase Victoria [name] Berg score to 56/56 to show significant improvement in Victoria [name] balance and decreased risk of falls.   Long Term Goal 3 Progress: Progressing toward goal Long Term Goal 4: The patient will increase Victoria [name] DGI to  greater than or equal to 19  to show significant improvement and decreased risk of falls.  Long Term Goal 4 Progress: Progressing toward goal PT Long Term Goal 5: The patient will be able to go up and down 11 stairs reciprocally without rails and without LOB to demonstrate improved strength, coordination and balance.    Problem List Patient Active Problem List  Diagnoses  . DIABETES MELLITUS  . DYSLIPIDEMIA  . OBESITY, UNSPECIFIED  . ANEMIA, IRON DEFICIENCY  . 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 HUNTINGTON'S DISEASE  . Unspecified Visual Loss  . ESSENTIAL HYPERTENSION, BENIGN  . ALLERGIC RHINITIS, SEASONAL  . ACUTE CYSTITIS  . DERMATITIS  . Pain in joint, pelvic region and thigh  . KNEE PAIN, RIGHT  . BACK PAIN, LUMBAR  . BURSITIS, HIP  . FATIGUE  . HEADACHE  . NECK PAIN  . ONYCHOMYCOSIS  . Balance problems  . Falls  . Lack of coordination  . Muscle weakness (generalized)    PT Plan of Care PT Home Exercise Plan: educated Victoria [name] Victoria [name] [name] and Victoria [name] Victoria [name] [name] that Victoria [name] goal for this week was to pick up Victoria [name] feet in order to decrease Victoria [name] chance of falling Consulted and Agree with Plan of Care: Patient;Family member/caregiver Family Member Consulted: Victoria [name] [name]  GP No functional reporting required  Maribel Hadley 08/23/2011, 4:11 PM

## 2011-08-25 ENCOUNTER — Encounter: Payer: Self-pay | Admitting: Family Medicine

## 2011-08-25 ENCOUNTER — Ambulatory Visit (INDEPENDENT_AMBULATORY_CARE_PROVIDER_SITE_OTHER): Payer: Medicare Other | Admitting: Family Medicine

## 2011-08-25 ENCOUNTER — Ambulatory Visit (HOSPITAL_COMMUNITY)
Admission: RE | Admit: 2011-08-25 | Discharge: 2011-08-25 | Disposition: A | Discharge status: Home / Self Care | Payer: Medicare Other | Source: Ambulatory Visit | Attending: Family Medicine | Admitting: Family Medicine

## 2011-08-25 VITALS — BP 110/74 | HR 74 | Resp 18 | Ht 63.0 in | Wt 185.0 lb

## 2011-08-25 DIAGNOSIS — I10 Essential (primary) hypertension: Secondary | ICD-10-CM

## 2011-08-25 DIAGNOSIS — W19XXXA Unspecified fall, initial encounter: Secondary | ICD-10-CM

## 2011-08-25 DIAGNOSIS — E785 Hyperlipidemia, unspecified: Secondary | ICD-10-CM | POA: Diagnosis not present

## 2011-08-25 DIAGNOSIS — E119 Type 2 diabetes mellitus without complications: Secondary | ICD-10-CM | POA: Diagnosis not present

## 2011-08-25 DIAGNOSIS — T300 Burn of unspecified body region, unspecified degree: Secondary | ICD-10-CM

## 2011-08-25 DIAGNOSIS — R5381 Other malaise: Secondary | ICD-10-CM

## 2011-08-25 DIAGNOSIS — D509 Iron deficiency anemia, unspecified: Secondary | ICD-10-CM

## 2011-08-25 DIAGNOSIS — R2689 Other abnormalities of gait and mobility: Secondary | ICD-10-CM

## 2011-08-25 DIAGNOSIS — E669 Obesity, unspecified: Secondary | ICD-10-CM

## 2011-08-25 DIAGNOSIS — M6281 Muscle weakness (generalized): Secondary | ICD-10-CM

## 2011-08-25 DIAGNOSIS — R279 Unspecified lack of coordination: Secondary | ICD-10-CM

## 2011-08-25 DIAGNOSIS — Z23 Encounter for immunization: Secondary | ICD-10-CM | POA: Diagnosis not present
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
## 2011-08-25 NOTE — Progress Notes (Signed)
Physical Therapy Treatment Patient Details  Name: Victoria [name] MRN: [id number] Date of Birth: 1971/06/08  Today's Date: 08/25/2011 Time: 1478-2956 Time Calculation (min): 42 min Visit#: 6  of 16   Re-eval: 09/02/11  Charge: gait 10 min (TM/stairs)  NMR 23 min therex 9 min  Subjective: Symptoms/Limitations Symptoms: PT: S; Mother stated pt still shuffling feet with gait. Pain Assessment Currently in Pain?: No/denies  Objective:  Exercise/Treatments Aerobic Tread Mill: 6' w/min-mod A and another assistance in front of TM for appropriate posture.  1.2 mph Machines for Strengthening Cybex Knee Extension: 1.5 PL 15 reps Cybex Knee Flexion: 3.5 Pl 15 repa Standing Heel Raises: 15 reps;Limitations Heel Raises Limitations: Toeraises 15 reps Forward Step Up: 10 reps;Limitations;Step Height: 4" Forward Step Up Limitations: toe tapping 4" in alternating. Functional Squat: 20 reps Stairs: 2 RT reciprocal pattern 1 HA with CGA SLS: R 9", L 13" max of 2 Other Standing Knee Exercises: Walking around gym with arms above head 2 RT, pre gait: weight shifting with hands from waist to digonal flexion x10 each direction, Hay Bales x10 each direction Other Standing Knee Exercises: Dance movements to incorporate hip mobility.   Balance Exercises Tread Mill: 6' w/min-mod A and another assistance in front of TM for appropriate posture.  1.2 mph Tandem Walking: 2 round trips Retro Gait: 2 round trips March on Foam/Wedge: Limitations March on Foam/Wedge Limitations: high marching 10 repsx 3" holds Single Limb Stance Limitations: R 9", L 13" max of 2 Tandem Stance: Right;Left;Limitations Tandem Stance Limitations: 2x 30" each foot in advance  Physical Therapy Assessment and Plan PT Assessment and Plan Clinical Impression Statement: Incorporated dance moves to improve pelvic mobility.  Pt tolerated well towards total session.  Balance still impaired, required min-mod assistance with tandem  stance/gait for LOB episodes.   PT Plan: continue to improve her overall functional strength and balance, especially related to the Berg and DGI in order to meet goals.    Goals    Problem List Patient Active Problem List  Diagnoses  . DIABETES MELLITUS  . DYSLIPIDEMIA  . OBESITY, UNSPECIFIED  . ANEMIA, IRON DEFICIENCY  . 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 HUNTINGTON'S DISEASE  . Unspecified Visual Loss  . ESSENTIAL HYPERTENSION, BENIGN  . ALLERGIC RHINITIS, SEASONAL  . ACUTE CYSTITIS  . DERMATITIS  . Pain in joint, pelvic region and thigh  . KNEE PAIN, RIGHT  . BACK PAIN, LUMBAR  . BURSITIS, HIP  . FATIGUE  . HEADACHE  . NECK PAIN  . ONYCHOMYCOSIS  . Balance problems  . Falls  . Lack of coordination  . Muscle weakness (generalized)  . Burn    PT - End of Session Activity Tolerance: Patient tolerated treatment well General Behavior During Session: [facility] for tasks performed Cognition: [facility] for tasks performed  Juel Burrow, PTA 08/25/2011, 3:22 PM

## 2011-08-25 NOTE — Progress Notes (Signed)
Speech Language Pathology Treatment Patient Details  Name: Victoria [name] MRN: [id number] Date of Birth: 30-Sep-1970 Visit #: 3  Today's Date: 08/25/2011 Time: 1515-1600 Time Calculation (min): 45 min  HPI:  Symptoms/Limitations Symptoms: Doing well. Seen after PT. Pain Assessment Currently in Pain?: No/denies Pain Score: 0-No pain   Treatment  Cognitive-Linguistic Therapy Dysarthria Therapy Dysphagia Therapy Patient/Family Education Home Exercise Program   SLP Goals  Home Exercise SLP Goal: Patient will Perform Home Exercise Program: with supervision, verbal cues required/provided SLP Short Term Goals SLP Short Term Goal 1: Pt will implement speech intelligibility strategies in guided sentence repetition with 90% intelligibility and mod/max cues. SLP Short Term Goal 2: Pt will decrease rate and increase vocal intensity in 5 minute conversation with mod/max cues throughout. SLP Short Term Goal 3: Pt will complete labio-lingual OMEs x 10 each with written cue provided and moderate verbal cues. SLP Short Term Goal 4: Pt will consume regular textures and thin liquids with minimal signs/symptoms of aspiration with use of compensatory strategies. SLP Short Term Goal 5: Pt will look to memory book for information when provided min verbal cues. SLP Long Term Goals SLP Long Term Goal 1: Pt will increase safetly with po at home with use of compensatory strategies as needed. SLP Long Term Goal 2: Pt will increase speech intelligibility for short conversations with family and medical team to mild impairment with use of compensatory strategies and verbal cues.  SLP Long Term Goal 3: Provide pt/family education on effective compensatory strategies for memory in home environment.  Assessment/Plan  Patient Active Problem List  Diagnoses  . DIABETES MELLITUS  . DYSLIPIDEMIA  . OBESITY, UNSPECIFIED  . ANEMIA, IRON DEFICIENCY  . 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 HUNTINGTON'S DISEASE    . Unspecified Visual Loss  . ESSENTIAL HYPERTENSION, BENIGN  . ALLERGIC RHINITIS, SEASONAL  . ACUTE CYSTITIS  . DERMATITIS  . Pain in joint, pelvic region and thigh  . KNEE PAIN, RIGHT  . BACK PAIN, LUMBAR  . BURSITIS, HIP  . FATIGUE  . HEADACHE  . NECK PAIN  . ONYCHOMYCOSIS  . Balance problems  . Falls  . Lack of coordination  . Muscle weakness (generalized)  . Burn   SLP - End of Session Activity Tolerance: Patient tolerated treatment well General Behavior During Session: [facility] for tasks performed Cognition: Impaired  SLP Assessment/Plan Clinical Impression Statement: Victoria [name] was in good spirits today and laughed and smiled frequently. She was introduced to her memory book and compensatory strategies were added and reviewed. Her speech intelligibility was improved today. Her mother will accompany her for the next session so that she can assist with information for her memory book. Speech Therapy Frequency: min 2x/week Duration: 4 weeks Treatment/Interventions: Diet toleration management by SLP;Compensatory techniques;Oral motor exercises;Functional tasks;Compensatory strategies;SLP instruction and feedback;Patient/family education;Multimodal communcation approach;Cueing hierarchy Potential to Achieve Goals: Good Potential Considerations: Ability to learn/carryover information;Medical prognosis  GN No functional reporting required  Victoria [name] 08/25/2011, 8:31 PM
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
## 2011-08-28 NOTE — Assessment & Plan Note (Signed)
Controlled, no change in medication  

## 2011-08-28 NOTE — Assessment & Plan Note (Signed)
Deteriorated. Patient re-educated about  the importance of commitment to a  minimum of 150 minutes of exercise per week. The importance of healthy food choices with portion control discussed. Encouraged to start a food diary, count calories and to consider  joining a support group. Sample diet sheets offered. Goals set by the patient for the next several months.    

## 2011-08-28 NOTE — Assessment & Plan Note (Signed)
Improved, no med change 

## 2011-08-28 NOTE — Assessment & Plan Note (Signed)
Hyperlipidemia:Low fat diet discussed and encouraged.  Fasting labs prior to next visit 

## 2011-08-30 ENCOUNTER — Ambulatory Visit (HOSPITAL_COMMUNITY)
Admission: RE | Admit: 2011-08-30 | Discharge: 2011-08-30 | Disposition: A | Discharge status: Home / Self Care | Payer: Medicare Other | Source: Ambulatory Visit | Attending: Family Medicine | Admitting: Family Medicine

## 2011-08-30 DIAGNOSIS — W19XXXA Unspecified fall, initial encounter: Secondary | ICD-10-CM

## 2011-08-30 DIAGNOSIS — M6281 Muscle weakness (generalized): Secondary | ICD-10-CM

## 2011-08-30 DIAGNOSIS — R279 Unspecified lack of coordination: Secondary | ICD-10-CM

## 2011-08-30 DIAGNOSIS — R2689 Other abnormalities of gait and mobility: Secondary | ICD-10-CM
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
## 2011-08-30 NOTE — Progress Notes (Signed)
Speech Language Pathology Treatment Patient Details  Name: Victoria [name] MRN: [id number] Date of Birth: [birth year] Visit #: 4  Today's Date: 08/30/2011 Time: 4098-1191 Time Calculation (min): 53 min  HPI:  Symptoms/Limitations Symptoms: Doing well. Seen after PT. Special Tests: N/A Pain Assessment Currently in Pain?: No/denies Pain Score: 0-No pain   Treatment  Cognitive-Linguistic Therapy Dysarthria Therapy Dysphagia Therapy Oral Motor Exercises Patient/Family Education Home Exercise Program  SLP Goals  Home Exercise SLP Goal: Patient will Perform Home Exercise Program: with supervision, verbal cues required/provided SLP Goal: Perform Home Exercise Program - Progress: Progressing toward goal SLP Short Term Goals SLP Short Term Goal 1: Pt will implement speech intelligibility strategies in guided sentence repetition with 90% intelligibility and mod/max cues. SLP Short Term Goal 1 - Progress: Progressing toward goal SLP Short Term Goal 2: Pt will decrease rate and increase vocal intensity in 5 minute conversation with mod/max cues throughout. SLP Short Term Goal 2 - Progress: Progressing toward goal SLP Short Term Goal 3: Pt will complete labio-lingual OMEs x 10 each with written cue provided and moderate verbal cues. SLP Short Term Goal 3 - Progress: Progressing toward goal SLP Short Term Goal 4: Pt will consume regular textures and thin liquids with minimal signs/symptoms of aspiration with use of compensatory strategies. SLP Short Term Goal 4 - Progress: Progressing toward goal SLP Short Term Goal 5: Pt will look to memory book for information when provided min verbal cues. SLP Short Term Goal 5 - Progress: Progressing toward goal SLP Long Term Goals SLP Long Term Goal 1: Pt will increase safetly with po at home with use of compensatory strategies as needed. SLP Long Term Goal 1 - Progress: Progressing toward goal SLP Long Term Goal 2: Pt will increase speech  intelligibility for short conversations with family and medical team to mild impairment with use of compensatory strategies and verbal cues.  SLP Long Term Goal 2 - Progress: Progressing toward goal SLP Long Term Goal 3: Provide pt/family education on effective compensatory strategies for memory in home environment. SLP Long Term Goal 3 - Progress: Progressing toward goal  Assessment/Plan  Patient Active Problem List  Diagnoses  . DIABETES MELLITUS  . DYSLIPIDEMIA  . OBESITY, UNSPECIFIED  . ANEMIA, IRON DEFICIENCY  . 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 HUNTINGTON'S DISEASE  . Unspecified Visual Loss  . ESSENTIAL HYPERTENSION, BENIGN  . ALLERGIC RHINITIS, SEASONAL  . ACUTE CYSTITIS  . DERMATITIS  . Pain in joint, pelvic region and thigh  . KNEE PAIN, RIGHT  . BACK PAIN, LUMBAR  . BURSITIS, HIP  . FATIGUE  . HEADACHE  . NECK PAIN  . ONYCHOMYCOSIS  . Balance problems  . Falls  . Lack of coordination  . Muscle weakness (generalized)  . Burn   SLP - End of Session Activity Tolerance: Patient tolerated treatment well General Behavior During Session: [facility] for tasks performed Cognition: Impaired  SLP Assessment/Plan Clinical Impression Statement: Victoria [name] unfortunately forgot her memory book at home. Her mother has not reviewed the material included in it as she has been very busy. Victoria [name]'s mother cares for Victoria [name] and for her own mother who has Alzheimer's dementia. Information about Huntington's was reviewed and questions answered. Victoria [name]'s mother was present for therapy today and she expressed interest in having Victoria [name] establish routines around the home. Victoria [name] was able to recall 2 speech strategies with min cues. Swallow function appears to be [facility] at home.  Speech Therapy Frequency: min 2x/week Duration: 4 weeks Treatment/Interventions: Diet  toleration management by SLP;Compensatory techniques;Oral motor exercises;Functional tasks;Compensatory strategies;SLP instruction and  feedback;Patient/family education;Multimodal communcation approach;Cueing hierarchy Potential to Achieve Goals: Good Potential Considerations: Ability to learn/carryover information;Medical prognosis  GN No functional reporting required  Victoria [name], CCC-SLP  Victoria [name] 08/30/2011, 6:10 PM

## 2011-08-30 NOTE — Progress Notes (Signed)
Occupational Therapy Treatment  Patient Details  Name: ROSANGELA FEHRENBACH MRN: [id number] Date of Birth: [birth year]  Today's Date: 08/30/2011 Time: 3086-5784 Time Calculation (min): 37 min  Visit#: 7  of 8   Re-eval: 08/31/11 Neuro re-ed [phone]'    Subjective Symptoms/Limitations Symptoms: OT: S:  I like muffins. Pain Assessment Currently in Pain?: No/denies  Precautions/Restrictions     Exercise/Treatments ROM / Strengthening / Isometric Strengthening UBE (Upper Arm Bike): 3' forward 3' backward 2.5    Cognitive Exercises Problem Solving: with connect four Sequencing Skills: with cooking task Deductive Reasoning: with connect four  Neurological Re-education     Weight Bearing Exercises Standing with weight shifting on and off: when flattening putty       Grasp and Release Grasp and Release: Theraputty Theraputty - Flatten: pink Theraputty - Roll: pink Theraputty - Grip: pink Theraputty - Pinch: pink            Occupational Therapy Assessment and Plan OT Assessment and Plan Clinical Impression Statement: OT: P:  Min assist for 3 step directions with cooking task.  Delaila did not display any unsafe behaviors during the cooking task and when asked what she should do when handeling hot items she knew to use a pot holder..   OT Plan: OT: P:  Increase attention to task and following schedule to be  implemented by speech and OT combined.   Goals Home Exercise Program Pt will Perform Home Exercise Program: Independently Short Term Goals Time to Complete Short Term Goals: 2 weeks Short Term Goal 1: Decrase Right Shoulder pain from8/10-0-1/10 Short Term Goal 2: Patient / caregiver training techniqes to decrease Right shoulder pain. Short Term Goal 3: Patient able to participate in exercises and activities to increase BUE shoulder mobility given verbal cues, demonstration and props. Long Term Goals Time to Complete Long Term Goals: 8 weeks Long Term Goal 1:  Pt/caregiver will demonstrate independence with HEP for UE's Long Term Goal 2: [name] caregiver will verbalize understanding of DME/AE and modified techniques to maximize pt's safety and independence with ADL's Long Term Goal 3: Pt will report bilateral UE shoulder pain less than or equal to 5/10 with performance of HEP/ADL's  Problem List Patient Active Problem List  Diagnoses  . DIABETES MELLITUS  . DYSLIPIDEMIA  . OBESITY, UNSPECIFIED  . ANEMIA, IRON DEFICIENCY  . 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 HUNTINGTON'S DISEASE  . Unspecified Visual Loss  . ESSENTIAL HYPERTENSION, BENIGN  . ALLERGIC RHINITIS, SEASONAL  . ACUTE CYSTITIS  . DERMATITIS  . Pain in joint, pelvic region and thigh  . KNEE PAIN, RIGHT  . BACK PAIN, LUMBAR  . BURSITIS, HIP  . FATIGUE  . HEADACHE  . NECK PAIN  . ONYCHOMYCOSIS  . Balance problems  . Falls  . Lack of coordination  . Muscle weakness (generalized)  . Burn    End of Session Activity Tolerance: Patient tolerated treatment well General Behavior During Session: [facility] for tasks performed Cognition: [facility] for tasks performed  GO No functional reporting required  Debbra Digiulio L. Noralee Stain, COTA/L  08/30/2011, 5:53 PM

## 2011-09-01 ENCOUNTER — Ambulatory Visit (HOSPITAL_COMMUNITY)
Admission: RE | Admit: 2011-09-01 | Discharge: 2011-09-01 | Disposition: A | Discharge status: Home / Self Care | Payer: Medicare Other | Source: Ambulatory Visit | Attending: Family Medicine | Admitting: Family Medicine

## 2011-09-01 NOTE — Progress Notes (Signed)
Occupational Therapy Treatment  Patient Details  Name: Victoria [name] MRN: [id number] Date of Birth: 10/03/1970  Today's Date: 09/01/2011 Time: 7829-5621 Time Calculation (min): 39 min  Visit#: 8  of 8   Re-eval: 08/31/11    Subjective Symptoms/Limitations Symptoms: OT: S:  [place]  (to me) Pain Assessment Currently in Pain?: No/denies Pain Score: 0-No pain  Precautions/Restrictions     Exercise/Treatments  ROM / Strengthening / Isometric Strengthening UBE (Upper Arm Bike): 3' forward 3' backward 2.5      Cognitive Exercises Problem Solving: with connect four Scanning: with connect four Deductive Reasoning: with connect four  Neurological Re-education Exercises    Weight Bearing Exercises Weight Bearing Position: Standing Standing with weight shifting on and off: when flattening putty  Development of Reach     Grasp and Release Grasp and Release: Theraputty Theraputty - Flatten: pink Theraputty - Roll: pink Theraputty - Grip: pink Theraputty - Pinch: pink Theraputty - Locate Pegs: x12            Occupational Therapy Assessment and Plan OT Assessment and Plan Clinical Impression Statement: OT: P:  Increased cues for scanning and comprehension during task. OT Plan: OT: P: Practice tying shoes.   Goals Home Exercise Program Pt will Perform Home Exercise Program: Independently PT Goal: Perform Home Exercise Program - Progress: Not met  Problem List Patient Active Problem List  Diagnoses  . DIABETES MELLITUS  . DYSLIPIDEMIA  . OBESITY, UNSPECIFIED  . ANEMIA, IRON DEFICIENCY  . 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 HUNTINGTON'S DISEASE  . Unspecified Visual Loss  . ESSENTIAL HYPERTENSION, BENIGN  . ALLERGIC RHINITIS, SEASONAL  . ACUTE CYSTITIS  . DERMATITIS  . Pain in joint, pelvic region and thigh  . KNEE PAIN, RIGHT  . BACK PAIN, LUMBAR  . BURSITIS, HIP  . FATIGUE  . HEADACHE  . NECK PAIN  . ONYCHOMYCOSIS  . Balance problems  .  Falls  . Lack of coordination  . Muscle weakness (generalized)  . Burn    End of Session Activity Tolerance: Patient tolerated treatment well General Behavior During Session: [facility] for tasks performed Cognition: Impaired  GO No functional reporting required   Alajia Schmelzer L. Noralee Stain, COTA/L  09/01/2011, 6:17 PM

## 2011-09-01 NOTE — Progress Notes (Signed)
Speech Language Pathology Treatment Patient Details  Name: GARYN WAGUESPACK MRN: [id number] Date of Birth: May 02, 1971 Visit #: 5  Today's Date: 09/01/2011 Time: 0272-5366 Time Calculation (min): 49 min  HPI:  Symptoms/Limitations Symptoms: Doing well. Seen after PT. Pain Assessment Currently in Pain?: No/denies Pain Score: 0-No pain   Treatment  Cognitive-Linguistic Therapy Dysarthria Therapy Oral Motor Exercises Patient/Family Education Home Exercise Program  SLP Goals  Home Exercise SLP Goal: Patient will Perform Home Exercise Program: with supervision, verbal cues required/provided SLP Goal: Perform Home Exercise Program - Progress: Progressing toward goal SLP Short Term Goals SLP Short Term Goal 1: Pt will implement speech intelligibility strategies in guided sentence repetition with 90% intelligibility and mod/max cues. SLP Short Term Goal 1 - Progress: Progressing toward goal SLP Short Term Goal 2: Pt will decrease rate and increase vocal intensity in 5 minute conversation with mod/max cues throughout. SLP Short Term Goal 2 - Progress: Progressing toward goal SLP Short Term Goal 3: Pt will complete labio-lingual OMEs x 10 each with written cue provided and moderate verbal cues. SLP Short Term Goal 3 - Progress: Progressing toward goal SLP Short Term Goal 4: Pt will consume regular textures and thin liquids with minimal signs/symptoms of aspiration with use of compensatory strategies. SLP Short Term Goal 4 - Progress: Progressing toward goal SLP Short Term Goal 5: Pt will look to memory book for information when provided min verbal cues. SLP Short Term Goal 5 - Progress: Progressing toward goal SLP Long Term Goals SLP Long Term Goal 1: Pt will increase safetly with po at home with use of compensatory strategies as needed. SLP Long Term Goal 1 - Progress: Progressing toward goal SLP Long Term Goal 2: Pt will increase speech intelligibility for short conversations with  family and medical team to mild impairment with use of compensatory strategies and verbal cues.  SLP Long Term Goal 2 - Progress: Progressing toward goal SLP Long Term Goal 3: Provide pt/family education on effective compensatory strategies for memory in home environment. SLP Long Term Goal 3 - Progress: Progressing toward goal  Assessment/Plan  Patient Active Problem List  Diagnoses  . DIABETES MELLITUS  . DYSLIPIDEMIA  . OBESITY, UNSPECIFIED  . ANEMIA, IRON DEFICIENCY  . 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 HUNTINGTON'S DISEASE  . Unspecified Visual Loss  . ESSENTIAL HYPERTENSION, BENIGN  . ALLERGIC RHINITIS, SEASONAL  . ACUTE CYSTITIS  . DERMATITIS  . Pain in joint, pelvic region and thigh  . KNEE PAIN, RIGHT  . BACK PAIN, LUMBAR  . BURSITIS, HIP  . FATIGUE  . HEADACHE  . NECK PAIN  . ONYCHOMYCOSIS  . Balance problems  . Falls  . Lack of coordination  . Muscle weakness (generalized)  . Burn   SLP - End of Session Activity Tolerance: Patient tolerated treatment well General Behavior During Session: [facility] for tasks performed Cognition: Impaired  SLP Assessment/Plan Clinical Impression Statement: [name] was excited to tell me that she remembered to bring her book today. She was accompanied to therapy by her mother. Her mother is very interested in finding activities for Brookelle to do throughout the day besides watch TV. I suggested that they look into joining the [facility] and [name] seemed very excited about that. After reheasal and role-play with me, she called the [employer] to inquire about discounted memberships. Initially, she tried reading from a script, but this proved very difficult. She had difficulty organizing and verbalizing her thoughs and needed moderate cuing to complete the phone call.  Chealsea needs reminders to use her speech intelligibility strategies, specifically slowing down and increasing loudness. Speech Therapy Frequency: min 2x/week Duration: 2  weeks Treatment/Interventions: Diet toleration management by SLP;Compensatory techniques;Oral motor exercises;Functional tasks;Compensatory strategies;SLP instruction and feedback;Patient/family education;Multimodal communcation approach;Cueing hierarchy Potential to Achieve Goals: Good Potential Considerations: Ability to learn/carryover information;Medical prognosis  GN Current Status  [id number] - Motor Speech CK - At least 40% but less than 60% impaired, limited or restricted    Ivett Luebbe 09/01/2011, 5:44 PM

## 2011-09-01 NOTE — Progress Notes (Signed)
Physical Therapy Evaluation  Patient Details  Name: ELLIANNE GOWEN MRN: [id number] Date of Birth: 09-Apr-1971  Today's Date: 09/01/2011 Time: 1478-2956 Time Calculation (min): 40 min Visit#: 8  of 16   Re-eval: 09/15/11  Charge: gait 8 Self care: 10 min Physical performance testing with separate report 20 min MMT 1 unit  Subjective Pain Assessment Currently in Pain?: No/denies Pain Score: 0-No pain  Objective:  Sensation/Coordination/Flexibility/Functional Tests Functional Tests Functional Tests: ABC Scale 73%  Assessment RLE Strength Right Hip Flexion: 5/5 (4+/5) Right Hip Extension:  (4+/5) Right Hip ABduction: 5/5 Right Hip ADduction: 5/5 Right Knee Flexion: 5/5 Right Knee Extension: 5/5 Right Ankle Dorsiflexion: 5/5 Right Ankle Plantar Flexion: 5/5 LLE Strength Left Hip Flexion: 5/5 Left Hip Extension: 5/5 Left Hip ABduction: 5/5 Left Hip ADduction: 5/5 Left Knee Flexion: 5/5 Left Knee Extension: 5/5 Left Ankle Dorsiflexion: 5/5 Left Ankle Plantar Flexion: 5/5  Exercise/Treatments Mobility/Balance  Berg Balance Test Standing Unsupported, Alternately Place Feet on Step/Stool: Able to stand independently and safely and complete 8 steps in 20 seconds   Standing Stairs: 2 RT reciprocal pattern 1 HA with CGA SLS: R 20", L 17" max of 3    Physical Therapy Assessment and Plan PT Assessment and Plan Clinical Impression Statement: Re-assessment complete, Ms. Mcmasters has had 8 OPPT sessions over 4 weeks.  Pt has met 5/5 STG and 3/5 LTGs.  Pt with improved strength and balance overall.  Mother reports 2 stumbes a week, reported no falls since beginning PT, strength is 5/5 except for hip extension, Dynamic gait index score 19 and BERG 55/56, pt able to ascend/descend stairs reciprocially with no HR and no LOB episodes but did required min cueing to slow down  for contol.  Had pt.'s mother assess the activities-specific balance confidence scale for daugther which  scored 73% confidence scale.  Mother is unconfident with pt.'s safely walking up and down stairs, standing on uneven surfaces and reaching for objects,  walking in a crowded environment such as a mall and walking on icy sidewalks.  PT has added new goals to address these concerns.  PT Plan: Hold PT for 2 weeks then return for reassessment.  Upon return focus on safety awareness with ambulation and new goals.    Goals Home Exercise Program Pt will Perform Home Exercise Program: Independently PT Goal: Perform Home Exercise Program - Progress: Not met PT Short Term Goals Time to Complete Short Term Goals: 4 weeks PT Short Term Goal 1: The patient and her mom will report less than or equal to 3 stumbles per day to show improved balance at home.   PT Short Term Goal 1 - Progress: Met (2 stumbles per day) PT Short Term Goal 2: The patient and her mother will report no falls to the ground for the past two weeks to show improved balance and decreased falls.   PT Short Term Goal 2 - Progress: Met PT Short Term Goal 3: The patietn will improve leg strength by 1/2 muscle grade to show improved strength.   PT Short Term Goal 3 - Progress: Met PT Short Term Goal 4: The patient will increase DGI by 2 points indicating decreasead fall risk.   PT Short Term Goal 4 - Progress: Met PT Short Term Goal 5: The patient will increase her Berg score by 2 points to indicate decreased fall risk.  PT Short Term Goal 5 - Progress: Met PT Long Term Goals Time to Complete Long Term Goals: 8 weeks PT Long Term  Goal 1: The patient will demonstrate 5/5 leg strength bil to improve her ability to perform stairs reciprocally without rails.   PT Long Term Goal 1 - Progress: Partly met PT Long Term Goal 2: The patient and her mother will report no falls for 3 weeks to demonstrate increased balance.   PT Long Term Goal 2 - Progress: Met Long Term Goal 3: The patient will increase her Berg score to 56/56 to show significant  improvement in her balance and decreased risk of falls.   Long Term Goal 3 Progress: Progressing toward goal Long Term Goal 4: The patient will increase her DGI to  greater than or equal to 19 to show significant improvement and decreased risk of falls.  Long Term Goal 4 Progress: Met PT Long Term Goal 5: The patient will be able to go up and down 11 stairs reciprocally without rails and without LOB to demonstrate improved strength, coordination and balance.   Long Term Goal 5 Progress: Met (no LOB with reciprocal stairs no HR, mother unconfident ) Additional PT Long Term Goals?: Yes PT Long Term Goal 6: Pt with be independent with HEP. PT Long Term Goal 7: Pt with improve ABC Scale by 10%.  Problem List Patient Active Problem List  Diagnoses  . DIABETES MELLITUS  . DYSLIPIDEMIA  . OBESITY, UNSPECIFIED  . ANEMIA, IRON DEFICIENCY  . 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 HUNTINGTON'S DISEASE  . Unspecified Visual Loss  . ESSENTIAL HYPERTENSION, BENIGN  . ALLERGIC RHINITIS, SEASONAL  . ACUTE CYSTITIS  . DERMATITIS  . Pain in joint, pelvic region and thigh  . KNEE PAIN, RIGHT  . BACK PAIN, LUMBAR  . BURSITIS, HIP  . FATIGUE  . HEADACHE  . NECK PAIN  . ONYCHOMYCOSIS  . Balance problems  . Falls  . Lack of coordination  . Muscle weakness (generalized)  . Burn    PT - End of Session Activity Tolerance: Patient tolerated treatment well General Behavior During Session: [facility] for tasks performed Cognition: [facility] for tasks performed  Juel Burrow, PTA 09/01/2011, 5:39 PM

## 2011-09-06 ENCOUNTER — Ambulatory Visit (HOSPITAL_COMMUNITY)
Admission: RE | Admit: 2011-09-06 | Discharge: 2011-09-06 | Disposition: A | Discharge status: Home / Self Care | Payer: Medicare Other | Source: Ambulatory Visit | Attending: Family Medicine | Admitting: Family Medicine

## 2011-09-06 NOTE — Progress Notes (Signed)
Speech Language Pathology Treatment Patient Details  Name: NISHI NEISWONGER MRN: [id number] Date of Birth: 04-23-1971 Visit #:6  Today's Date: 09/06/2011 Time: 4098-1191 Time Calculation (min): 45 min  HPI:  Symptoms/Limitations Symptoms: Samanda is doing well and remembered her notebook today. Special Tests: N/A Pain Assessment Currently in Pain?: No/denies Pain Score: 0-No pain   Treatment  Cognitive-Linguistic Therapy Dysarthria Therapy Oral Motor Exercises Patient/Family Education Home Exercise Program   SLP Goals  Home Exercise SLP Goal: Patient will Perform Home Exercise Program: with supervision, verbal cues required/provided SLP Short Term Goals SLP Short Term Goal 1: Pt will implement speech intelligibility strategies in guided sentence repetition with 90% intelligibility and mod/max cues. SLP Short Term Goal 1 - Progress: Progressing toward goal SLP Short Term Goal 2: Pt will decrease rate and increase vocal intensity in 5 minute conversation with mod/max cues throughout. SLP Short Term Goal 2 - Progress: Progressing toward goal SLP Short Term Goal 3: Pt will complete labio-lingual OMEs x 10 each with written cue provided and moderate verbal cues. SLP Short Term Goal 3 - Progress: Progressing toward goal SLP Short Term Goal 4: Pt will consume regular textures and thin liquids with minimal signs/symptoms of aspiration with use of compensatory strategies. SLP Short Term Goal 4 - Progress: Progressing toward goal SLP Short Term Goal 5: Pt will look to memory book for information when provided min verbal cues. SLP Short Term Goal 5 - Progress: Progressing toward goal SLP Long Term Goals SLP Long Term Goal 1: Pt will increase safetly with po at home with use of compensatory strategies as needed. SLP Long Term Goal 1 - Progress: Progressing toward goal SLP Long Term Goal 2: Pt will increase speech intelligibility for short conversations with family and medical team to mild  impairment with use of compensatory strategies and verbal cues.  SLP Long Term Goal 2 - Progress: Progressing toward goal SLP Long Term Goal 3: Provide pt/family education on effective compensatory strategies for memory in home environment. SLP Long Term Goal 3 - Progress: Progressing toward goal  Assessment/Plan  Patient Active Problem List  Diagnoses  . DIABETES MELLITUS  . DYSLIPIDEMIA  . OBESITY, UNSPECIFIED  . ANEMIA, IRON DEFICIENCY  . 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 HUNTINGTON'S DISEASE  . Unspecified Visual Loss  . ESSENTIAL HYPERTENSION, BENIGN  . ALLERGIC RHINITIS, SEASONAL  . ACUTE CYSTITIS  . DERMATITIS  . Pain in joint, pelvic region and thigh  . KNEE PAIN, RIGHT  . BACK PAIN, LUMBAR  . BURSITIS, HIP  . FATIGUE  . HEADACHE  . NECK PAIN  . ONYCHOMYCOSIS  . Balance problems  . Falls  . Lack of coordination  . Muscle weakness (generalized)  . Burn   SLP - End of Session Activity Tolerance: Patient tolerated treatment well General Behavior During Session: [facility] for tasks performed Cognition: Impaired  SLP Assessment/Plan Clinical Impression Statement: [name] and her mother filled our the information for the [employer], however [name] said that her son did not want to accompany her there. Her mother is going to talk to him this week. [name] needed moderate cues to implement speech intelligibility strategies in structured tasks (answering personal questions). Increasing her loudness provides the most efficient means of increasing intelligibilty (so as to not have to think about doing too many things). More information was added to her memory book. Speech Therapy Frequency: min 2x/week Duration: 2 weeks Treatment/Interventions: Diet toleration management by SLP;Compensatory techniques;Oral motor exercises;Functional tasks;Compensatory strategies;SLP instruction and feedback;Patient/family education;Multimodal communcation  approach;Cueing hierarchy Potential to Achieve  Goals: Good Potential Considerations: Ability to learn/carryover information;Medical prognosis  GN No functional reporting required  Taiten Brawn 09/06/2011, 6:02 PM

## 2011-09-07 NOTE — Progress Notes (Signed)
Occupational Therapy Treatment  Patient Details  Name: Victoria [name] MRN: [id number] Date of Birth: January 25, 1971  Today's Date: 09/07/2011 Time: 5956-3875 Time Calculation (min): 44 min  Visit#: 9  of 8   Re-eval: 09/07/11 Neuro reed [phone]'    Subjective Symptoms/Limitations Symptoms: OT: S:  I can't tie my shoes. Pain Assessment Currently in Pain?: No/denies Pain Score: 0-No pain  Precautions/Restrictions     Exercise/Treatments Hand Exercises Fine Motor Coordination:  (tying shoes)  Cognitive Exercises Problem Solving: following written and verbal instructions to tie shoes Sequencing Skills: steps to tie shoes Listing: what to do next after tying shoe  Neurological Re-education Fine Motor Coordination Fine Motor Coordination:  (tying shoes)        Occupational Therapy Assessment and Plan OT Assessment and Plan Clinical Impression Statement: OT: A:  Patient was tying her shoes a few weeks ago and is now unable to having a bad day.  Victoria [name] was unable to follow written, pictoral, follow demonstration, hand over hand assist or verbal instruction.  To make caregivers life easier I suggested no tie laces or slip on shoes.  Patient also given homework of writing from a book and reading. OT Plan: OT: P: Develop schedule with mom for patient to provide more structure.   REASSESS   Goals Home Exercise Program Pt will Perform Home Exercise Program: Independently Short Term Goals Time to Complete Short Term Goals: 2 weeks Short Term Goal 1: Decrase Right Shoulder pain from8/10-0-1/10 Short Term Goal 1 Progress: Met Short Term Goal 2: Patient / caregiver training techniqes to decrease Right shoulder pain. Short Term Goal 2 Progress: Met Short Term Goal 3: Patient able to participate in exercises and activities to increase BUE shoulder mobility given verbal cues, demonstration and props. Short Term Goal 3 Progress: Met Long Term Goals Time to Complete Long Term Goals: 8  weeks Long Term Goal 1: Pt/caregiver will demonstrate independence with HEP for UE's Long Term Goal 1 Progress: Progressing toward goal Long Term Goal 2: [name] caregiver will verbalize understanding of DME/AE and modified techniques to maximize pt's safety and independence with ADL's Long Term Goal 2 Progress: Progressing toward goal Long Term Goal 3: Pt will report bilateral UE shoulder pain less than or equal to 5/10 with performance of HEP/ADL's Long Term Goal 3 Progress: Met  Problem List Patient Active Problem List  Diagnoses  . DIABETES MELLITUS  . DYSLIPIDEMIA  . OBESITY, UNSPECIFIED  . ANEMIA, IRON DEFICIENCY  . 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 HUNTINGTON'S DISEASE  . Unspecified Visual Loss  . ESSENTIAL HYPERTENSION, BENIGN  . ALLERGIC RHINITIS, SEASONAL  . ACUTE CYSTITIS  . DERMATITIS  . Pain in joint, pelvic region and thigh  . KNEE PAIN, RIGHT  . BACK PAIN, LUMBAR  . BURSITIS, HIP  . FATIGUE  . HEADACHE  . NECK PAIN  . ONYCHOMYCOSIS  . Balance problems  . Falls  . Lack of coordination  . Muscle weakness (generalized)  . Burn    End of Session Activity Tolerance: Patient tolerated treatment well General Behavior During Session: [facility] for tasks performed Cognition: Impaired  GO No functional reporting required   Victoria [name] L. Noralee Stain, COTA/L  09/07/2011, 2:25 PM

## 2011-09-08 ENCOUNTER — Ambulatory Visit (HOSPITAL_COMMUNITY): Payer: Medicare Other | Admitting: Speech Pathology

## 2011-09-13 ENCOUNTER — Ambulatory Visit (HOSPITAL_COMMUNITY): Payer: Medicare Other | Admitting: Speech Pathology

## 2011-09-15 ENCOUNTER — Ambulatory Visit (HOSPITAL_COMMUNITY)
Admission: RE | Admit: 2011-09-15 | Discharge: 2011-09-15 | Disposition: A | Discharge status: Home / Self Care | Payer: Medicare Other | Source: Ambulatory Visit | Attending: Psychiatry | Admitting: Psychiatry

## 2011-09-15 NOTE — Progress Notes (Signed)
Occupational Therapy Treatment  Patient Details  Name: Victoria [name] MRN: [id number] Date of Birth: Jun 23, 1970  Today's Date: 09/15/2011 Time: 4098-1191 Time Calculation (min): 50 min  Visit#: 10  of    Re-eval:      Subjective Pain Assessment Currently in Pain?: No/denies Pain Score: 0-No pain  Precautions/Restrictions     Exercise/Treatments   ROM / Strengthening / Isometric Strengthening UBE (Upper Arm Bike): 3' forward 3' backward 2.5    Cognitive Exercises Sequencing Skills: when discussing daily schedule  Neurological Re-education Weight Bearing Exercises Weight Bearing Position: Standing Standing with weight shifting on and off: when flattening putty  Grasp and Release Grasp and Release: Theraputty Theraputty - Flatten: pink Theraputty - Roll: pink Theraputty - Grip: pink Theraputty - Pinch: pink   Occupational Therapy Assessment and Plan OT Assessment and Plan Clinical Impression Statement: OT:  Developed schedule with [name] to structure her day, increase her activity and decreae the amount of help her mother gives her as well as decrease the amount of time she watches TV. Reviewed the schedule with Mom and she felt it would be bebeficial to [name]. Rehab Potential: Good OT Plan: OT:  P:  Put fininshed schedule in notebook.   Goals Home Exercise Program Pt will Perform Home Exercise Program: Independently Short Term Goals Time to Complete Short Term Goals: 2 weeks Short Term Goal 1: Decrase Right Shoulder pain from8/10-0-1/10 Short Term Goal 2: Patient / caregiver training techniqes to decrease Right shoulder pain. Short Term Goal 3: Patient able to participate in exercises and activities to increase BUE shoulder mobility given verbal cues, demonstration and props. Long Term Goals Time to Complete Long Term Goals: 8 weeks Long Term Goal 1: Pt/caregiver will demonstrate independence with HEP for UE's Long Term Goal 2: [name] caregiver will verbalize  understanding of DME/AE and modified techniques to maximize pt's safety and independence with ADL's Long Term Goal 3: Pt will report bilateral UE shoulder pain less than or equal to 5/10 with performance of HEP/ADL's  Problem List Patient Active Problem List  Diagnoses  . DIABETES MELLITUS  . DYSLIPIDEMIA  . OBESITY, UNSPECIFIED  . ANEMIA, IRON DEFICIENCY  . 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 HUNTINGTON'S DISEASE  . Unspecified Visual Loss  . ESSENTIAL HYPERTENSION, BENIGN  . ALLERGIC RHINITIS, SEASONAL  . ACUTE CYSTITIS  . DERMATITIS  . Pain in joint, pelvic region and thigh  . KNEE PAIN, RIGHT  . BACK PAIN, LUMBAR  . BURSITIS, HIP  . FATIGUE  . HEADACHE  . NECK PAIN  . ONYCHOMYCOSIS  . Balance problems  . Falls  . Lack of coordination  . Muscle weakness (generalized)  . Burn    End of Session Activity Tolerance: Patient tolerated treatment well General Behavior During Session: [name] for tasks performed Cognition: [name] for tasks performed  GO No functional reporting required   Ereka Brau L. Noralee Stain, COTA/L  09/15/2011, 6:03 PM

## 2011-09-15 NOTE — Progress Notes (Signed)
Speech Language Pathology Treatment/Progress Note Patient Details  Name: Victoria [name] MRN: [id number] Date of Birth: [birth year] Visit #: 7  Today's Date: 09/15/2011 Time: 7829-5621 Time Calculation (min): 44 min  HPI:    Pain Assessment Pain Score: 0-No pain    Treatment  Cognitive-Linguistic Therapy Dysarthria Therapy Dysphagia Therapy Oral Motor Exercises Patient/Family Education Home Exercise Program  SLP Goals  Home Exercise SLP Goal: Patient will Perform Home Exercise Program: with supervision, verbal cues required/provided SLP Short Term Goals SLP Short Term Goal 1: Pt will implement speech intelligibility strategies in guided sentence repetition with 90% intelligibility and mod/max cues. SLP Short Term Goal 2: Pt will decrease rate and increase vocal intensity in 5 minute conversation with mod/max cues throughout. SLP Short Term Goal 3: Pt will complete labio-lingual OMEs x 10 each with written cue provided and moderate verbal cues. SLP Short Term Goal 4: Pt will consume regular textures and thin liquids with minimal signs/symptoms of aspiration with use of compensatory strategies. SLP Short Term Goal 5: Pt will look to memory book for information when provided min verbal cues. SLP Long Term Goals SLP Long Term Goal 1: Pt will increase safetly with po at home with use of compensatory strategies as needed. SLP Long Term Goal 2: Pt will increase speech intelligibility for short conversations with family and medical team to mild impairment with use of compensatory strategies and verbal cues.  SLP Long Term Goal 3: Provide pt/family education on effective compensatory strategies for memory in home environment.  Assessment/Plan  Patient Active Problem List  Diagnoses  . DIABETES MELLITUS  . DYSLIPIDEMIA  . OBESITY, UNSPECIFIED  . ANEMIA, IRON DEFICIENCY  . 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 HUNTINGTON'S DISEASE  . Unspecified Visual Loss  . ESSENTIAL  HYPERTENSION, BENIGN  . ALLERGIC RHINITIS, SEASONAL  . ACUTE CYSTITIS  . DERMATITIS  . Pain in joint, pelvic region and thigh  . KNEE PAIN, RIGHT  . BACK PAIN, LUMBAR  . BURSITIS, HIP  . FATIGUE  . HEADACHE  . NECK PAIN  . ONYCHOMYCOSIS  . Balance problems  . Falls  . Lack of coordination  . Muscle weakness (generalized)  . Burn   SLP - End of Session Activity Tolerance: Patient tolerated treatment well General Behavior During Session: [facility] for tasks performed Cognition: Impaired  SLP Assessment/Plan Clinical Impression Statement: Victoria [name] was given a word search puzzle to complete, however it proved to be very challenging for her. She says that she completes the same type at home, so she will try them over the week end. She needed 5 requests for repetition/clarification of information when interviewing her for information for her memory book. Victoria [name] agreed on items/tabs to include into her memory book. She missed two sessions due to illness, so I am requesting 2 more sessions to complete our goals. Speech Therapy Frequency: min 2x/week Duration: 1 week Treatment/Interventions: Diet toleration management by SLP;Compensatory techniques;Oral motor exercises;Functional tasks;Compensatory strategies;SLP instruction and feedback;Patient/family education;Multimodal communcation approach;Cueing hierarchy Potential to Achieve Goals: Good Potential Considerations: Ability to learn/carryover information;Medical prognosis  GN No functional reporting required  Havery Moros, CCC-SLP [phone]  Victoria [name] 09/15/2011, 6:13 PM                                                     Physician Treatment Plan  Your signature is required  to indicate approval of the treatment plan/progress as stated above. Please make a copy of this report for your files and return this physician signed original in the self-addressed envelope or fax to [phone]  [phone]. COMMENTS/CHANGES:__________________________________________________________________________________________________________________________   ____________________________________                 ____________________ Benjamin Stain                                                    DATE

## 2011-09-27 ENCOUNTER — Ambulatory Visit (HOSPITAL_COMMUNITY)
Admission: RE | Admit: 2011-09-27 | Discharge: 2011-09-27 | Disposition: A | Discharge status: Home / Self Care | Payer: Medicare Other | Source: Ambulatory Visit | Attending: Psychiatry | Admitting: Psychiatry

## 2011-09-27 ENCOUNTER — Ambulatory Visit (HOSPITAL_COMMUNITY)
Admission: RE | Admit: 2011-09-27 | Discharge: 2011-09-27 | Disposition: A | Discharge status: Home / Self Care | Payer: Medicare Other | Source: Ambulatory Visit | Attending: Family Medicine | Admitting: Family Medicine

## 2011-09-27 DIAGNOSIS — R4789 Other speech disturbances: Secondary | ICD-10-CM | POA: Diagnosis not present

## 2011-09-27 DIAGNOSIS — M6281 Muscle weakness (generalized): Secondary | ICD-10-CM | POA: Insufficient documentation

## 2011-09-27 DIAGNOSIS — E785 Hyperlipidemia, unspecified: Secondary | ICD-10-CM | POA: Insufficient documentation

## 2011-09-27 DIAGNOSIS — I10 Essential (primary) hypertension: Secondary | ICD-10-CM | POA: Diagnosis not present

## 2011-09-27 DIAGNOSIS — E119 Type 2 diabetes mellitus without complications: Secondary | ICD-10-CM | POA: Diagnosis not present

## 2011-09-27 DIAGNOSIS — IMO0001 Reserved for inherently not codable concepts without codable children: Secondary | ICD-10-CM | POA: Diagnosis not present

## 2011-09-27 DIAGNOSIS — M25619 Stiffness of unspecified shoulder, not elsewhere classified: Secondary | ICD-10-CM | POA: Diagnosis not present

## 2011-09-27 DIAGNOSIS — M25519 Pain in unspecified shoulder: Secondary | ICD-10-CM | POA: Insufficient documentation

## 2011-09-27 NOTE — Progress Notes (Signed)
Occupational Therapy Treatment  Patient Details  Name: STAVROULA ROHDE MRN: [id number] Date of Birth: Aug 04, 1970  Today's Date: 09/27/2011 Time: 4098-1191 Time Calculation (min): 49 min  Visit#: 11  of    Re-eval: 09/29/11 Neuro reed [phone]'    Subjective Symptoms/Limitations Symptoms: OT:  I don't know what street I live on. Pain Assessment Currently in Pain?: No/denies Pain Score: 0-No pain  Precautions/Restrictions     Exercise/Treatments Cognitive Exercises Copy this Image: copying words and letter. Attention Span: worksheet. Handwriting: practice writing her name and address  Neurological Re-education Occupational Therapy Assessment and Plan OT Assessment and Plan Clinical Impression Statement: OT:  Gave mom daily schedule to increase her activity.  Moderate difficulty with recalling her address and writing her address in case of emergency.  Ewelina able to write her own name well. Rehab Potential: Good OT Plan: OT:  P:  Reassess   Goals Home Exercise Program Pt will Perform Home Exercise Program: Independently Short Term Goals Time to Complete Short Term Goals: 2 weeks Short Term Goal 1: Decrase Right Shoulder pain from8/10-0-1/10 Short Term Goal 2: Patient / caregiver training techniqes to decrease Right shoulder pain. Short Term Goal 3: Patient able to participate in exercises and activities to increase BUE shoulder mobility given verbal cues, demonstration and props. Long Term Goals Time to Complete Long Term Goals: 8 weeks Long Term Goal 1: Pt/caregiver will demonstrate independence with HEP for UE's Long Term Goal 1 Progress: Met Long Term Goal 2: [name] caregiver will verbalize understanding of DME/AE and modified techniques to maximize pt's safety and independence with ADL's Long Term Goal 2 Progress: Met Long Term Goal 3: Pt will report bilateral UE shoulder pain less than or equal to 5/10 with performance of HEP/ADL's Long Term Goal 3 Progress:  Met  Problem List Patient Active Problem List  Diagnoses  . DIABETES MELLITUS  . DYSLIPIDEMIA  . OBESITY, UNSPECIFIED  . ANEMIA, IRON DEFICIENCY  . 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 HUNTINGTON'S DISEASE  . Unspecified Visual Loss  . ESSENTIAL HYPERTENSION, BENIGN  . ALLERGIC RHINITIS, SEASONAL  . ACUTE CYSTITIS  . DERMATITIS  . Pain in joint, pelvic region and thigh  . KNEE PAIN, RIGHT  . BACK PAIN, LUMBAR  . BURSITIS, HIP  . FATIGUE  . HEADACHE  . NECK PAIN  . ONYCHOMYCOSIS  . Balance problems  . Falls  . Lack of coordination  . Muscle weakness (generalized)  . Burn    End of Session Activity Tolerance: Patient tolerated treatment well General Behavior During Session: [facility] for tasks performed Cognition: Impaired Cognitive Impairment: Moderate difficulty following letter pattern.  GO No functional reporting required  Alvira Hecht L. Lucita Montoya, COTA/L   09/27/2011, 3:53 PM

## 2011-09-27 NOTE — Progress Notes (Signed)
Speech Language Pathology Treatment Patient Details  Name: Victoria [name] MRN: [id number] Date of Birth: 1970/09/08 Visit #: 8  Today's Date: 09/27/2011 Time: 1308-6578 Time Calculation (min): 40 min  HPI:  Symptoms/Limitations Symptoms: Victoria [name] is doing well and was accompanied by her mother today. Special Tests: N/A Pain Assessment Currently in Pain?: No/denies Pain Score: 0-No pain   Treatment  Cognitive-Linguistic Therapy Expressive Language Therapy Dysarthria Therapy Oral Motor Exercises Patient/Family Education Home Exercise Program   SLP Goals  Home Exercise SLP Goal: Patient will Perform Home Exercise Program: with supervision, verbal cues required/provided SLP Goal: Perform Home Exercise Program - Progress: Progressing toward goal SLP Short Term Goals SLP Short Term Goal 1: Pt will implement speech intelligibility strategies in guided sentence repetition with 90% intelligibility and mod/max cues. SLP Short Term Goal 1 - Progress: Progressing toward goal SLP Short Term Goal 2: Pt will decrease rate and increase vocal intensity in 5 minute conversation with mod/max cues throughout. SLP Short Term Goal 2 - Progress: Progressing toward goal SLP Short Term Goal 3: Pt will complete labio-lingual OMEs x 10 each with written cue provided and moderate verbal cues. SLP Short Term Goal 3 - Progress: Progressing toward goal SLP Short Term Goal 4: Pt will consume regular textures and thin liquids with minimal signs/symptoms of aspiration with use of compensatory strategies. SLP Short Term Goal 4 - Progress: Progressing toward goal SLP Short Term Goal 5: Pt will look to memory book for information when provided min verbal cues. SLP Short Term Goal 5 - Progress: Progressing toward goal SLP Long Term Goals SLP Long Term Goal 1: Pt will increase safetly with po at home with use of compensatory strategies as needed. SLP Long Term Goal 1 - Progress: Progressing toward goal SLP Long  Term Goal 2: Pt will increase speech intelligibility for short conversations with family and medical team to mild impairment with use of compensatory strategies and verbal cues.  SLP Long Term Goal 2 - Progress: Progressing toward goal SLP Long Term Goal 3: Provide pt/family education on effective compensatory strategies for memory in home environment. SLP Long Term Goal 3 - Progress: Progressing toward goal  Assessment/Plan  Patient Active Problem List  Diagnoses  . DIABETES MELLITUS  . DYSLIPIDEMIA  . OBESITY, UNSPECIFIED  . ANEMIA, IRON DEFICIENCY  . 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 HUNTINGTON'S DISEASE  . Unspecified Visual Loss  . ESSENTIAL HYPERTENSION, BENIGN  . ALLERGIC RHINITIS, SEASONAL  . ACUTE CYSTITIS  . DERMATITIS  . Pain in joint, pelvic region and thigh  . KNEE PAIN, RIGHT  . BACK PAIN, LUMBAR  . BURSITIS, HIP  . FATIGUE  . HEADACHE  . NECK PAIN  . ONYCHOMYCOSIS  . Balance problems  . Falls  . Lack of coordination  . Muscle weakness (generalized)  . Burn   SLP - End of Session Activity Tolerance: Patient tolerated treatment well General Behavior During Session: [facility] for tasks performed Cognition: Impaired  SLP Assessment/Plan Clinical Impression Statement: Victoria [name] did not complete the word search homework assignment, however I think the level is too difficult and will find more appropriate ones. Her memory book was reviewed. She was able to tell me 2/3 strategies for speech intelligibility. Victoria [name] implemented the strategies at the word level (category naming task) with min cues. Her mother reports that Victoria [name] needs reminders to use strategies outside of therapy. She completed oral motor exercises with mod/max cues and did better when provided a mirror for feedback. She has a difficult  time isolating lingual movements from jaw movements.  Speech Therapy Frequency: min 2x/week Duration: 1 week Treatment/Interventions: Diet toleration management by  SLP;Compensatory techniques;Oral motor exercises;Functional tasks;Compensatory strategies;SLP instruction and feedback;Patient/family education;Multimodal communcation approach;Cueing hierarchy Potential to Achieve Goals: Good Potential Considerations: Ability to learn/carryover information;Medical prognosis  GN No functional reporting required  Heleena Miceli 09/27/2011, 3:04 PM

## 2011-09-29 ENCOUNTER — Ambulatory Visit (HOSPITAL_COMMUNITY)
Admission: RE | Admit: 2011-09-29 | Discharge: 2011-09-29 | Disposition: A | Discharge status: Home / Self Care | Payer: Medicare Other | Source: Ambulatory Visit | Attending: Family Medicine | Admitting: Family Medicine

## 2011-09-29 DIAGNOSIS — E119 Type 2 diabetes mellitus without complications: Secondary | ICD-10-CM | POA: Diagnosis not present

## 2011-09-29 DIAGNOSIS — M6281 Muscle weakness (generalized): Secondary | ICD-10-CM | POA: Diagnosis not present

## 2011-09-29 DIAGNOSIS — M25619 Stiffness of unspecified shoulder, not elsewhere classified: Secondary | ICD-10-CM | POA: Diagnosis not present

## 2011-09-29 DIAGNOSIS — I10 Essential (primary) hypertension: Secondary | ICD-10-CM | POA: Diagnosis not present

## 2011-09-29 DIAGNOSIS — M25519 Pain in unspecified shoulder: Secondary | ICD-10-CM | POA: Diagnosis not present

## 2011-09-29 DIAGNOSIS — IMO0001 Reserved for inherently not codable concepts without codable children: Secondary | ICD-10-CM | POA: Diagnosis not present

## 2011-09-29 NOTE — Progress Notes (Signed)
Speech Language Pathology Treatment Patient Details  Name: Victoria [name] MRN: [id number] Date of Birth: Sep 07, 1970 VIsit #: 9  Today's Date: 09/29/2011 Time: 4098-1191 Time Calculation (min): 43 min  HPI:    Pain Assessment Currently in Pain?: Yes Pain Score:   6 Pain Location: Shoulder Pain Orientation: Right Pain Type: Acute pain   Treatment  Cognitive-Linguistic Therapy Dysarthria Therapy Oral Motor Exercises Patient/Family Education Home Exercise Program   SLP Goals  Home Exercise SLP Goal: Patient will Perform Home Exercise Program: with supervision, verbal cues required/provided SLP Goal: Perform Home Exercise Program - Progress: Progressing toward goal SLP Short Term Goals SLP Short Term Goal 1: Pt will implement speech intelligibility strategies in guided sentence repetition with 90% intelligibility and mod/max cues. SLP Short Term Goal 1 - Progress: Met SLP Short Term Goal 2: Pt will decrease rate and increase vocal intensity in 5 minute conversation with mod/max cues throughout. SLP Short Term Goal 2 - Progress: Met SLP Short Term Goal 3: Pt will complete labio-lingual OMEs x 10 each with written cue provided and moderate verbal cues. SLP Short Term Goal 3 - Progress: Progressing toward goal SLP Short Term Goal 4: Pt will consume regular textures and thin liquids with minimal signs/symptoms of aspiration with use of compensatory strategies. SLP Short Term Goal 4 - Progress: Progressing toward goal SLP Short Term Goal 5: Pt will look to memory book for information when provided min verbal cues. SLP Short Term Goal 5 - Progress: Progressing toward goal SLP Long Term Goals SLP Long Term Goal 1: Pt will increase safetly with po at home with use of compensatory strategies as needed. SLP Long Term Goal 1 - Progress: Progressing toward goal SLP Long Term Goal 2: Pt will increase speech intelligibility for short conversations with family and medical team to mild  impairment with use of compensatory strategies and verbal cues.  SLP Long Term Goal 2 - Progress: Progressing toward goal SLP Long Term Goal 3: Provide pt/family education on effective compensatory strategies for memory in home environment. SLP Long Term Goal 3 - Progress: Progressing toward goal  Assessment/Plan  Patient Active Problem List  Diagnoses  . DIABETES MELLITUS  . DYSLIPIDEMIA  . OBESITY, UNSPECIFIED  . ANEMIA, IRON DEFICIENCY  . 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 HUNTINGTON'S DISEASE  . Unspecified Visual Loss  . ESSENTIAL HYPERTENSION, BENIGN  . ALLERGIC RHINITIS, SEASONAL  . ACUTE CYSTITIS  . DERMATITIS  . Pain in joint, pelvic region and thigh  . KNEE PAIN, RIGHT  . BACK PAIN, LUMBAR  . BURSITIS, HIP  . FATIGUE  . HEADACHE  . NECK PAIN  . ONYCHOMYCOSIS  . Balance problems  . Falls  . Lack of coordination  . Muscle weakness (generalized)  . Burn   SLP - End of Session Activity Tolerance: Patient tolerated treatment well General Behavior During Session: [facility] for tasks performed Cognition: Impaired, at baseline Cognitive Impairment: unable to recall address without moderate vg.  SLP Assessment/Plan Clinical Impression Statement: Finnleigh did not complete homework. In session today she was given scanning/reading strategies to use for short sentences/charts. A small (25 letters) word search cube was very difficult and she needed max cues to complete. She did much better with reading single words in list forrm and identifying if they belonged to a particular category. Anticipate 1-2 sessions and discharge with home program.  Speech Therapy Frequency: min 1 x/week Duration: 1 week Potential to Achieve Goals: Good  GN No functional reporting required  Kalasia Crafton  09/29/2011, 4:43 PM

## 2011-09-29 NOTE — Progress Notes (Signed)
Occupational Therapy Treatment  Patient Details  Name: Victoria [name] MRN: [id number] Date of Birth: 07/05/1970  Today's Date: 09/29/2011 Time: 4098-1191 Time Calculation (min): 46 min Reassessment Self care 20'  Visit#: 12  of 16   Re-eval: 10/13/11    Subjective Symptoms/Limitations Symptoms: OT S:  She can't tie her shoe or fasten her bra.  I would really like her to do these things. Special Tests: Nine Hole Peg Test right 30.97" left 42.75".  FAQ scored 44/68 = 64% functional independence level  Pain Assessment Currently in Pain?: Yes Pain Score:   6 Pain Location: Shoulder Pain Orientation: Right Pain Type: Acute pain  Precautions/Restrictions     Exercise/Treatments Reviewed scheduling, recall of address, and practiced tying her shoe this date.  I demonstrated tying her shoe one time, and then she was able to tie shoe model independently after I completed the first step of crossing the laces.  She was able to tie my shoe for me with assist to cross the laces.  By the end of the session, she was tying the shoe model with complete I.  I would like to see her follow through with tying her own shoe without assistance. Remainder of session was spent on reassessment of coordination, BUE AROM and strength testing, and discussing different memory strategies for recalling her address and phone number.        Occupational Therapy Assessment and Plan OT Assessment and Plan Clinical Impression Statement: A:  Please see progress note.   OT Frequency: Min 2X/week OT Duration: 2 weeks OT Plan: P:  Continue OT working towards new goals established this date (shoulder sustained activity tolerance, memory of address, following daily schedule, FMC for tying shoes and fastening her bra.   Goals Home Exercise Program Pt will Perform Home Exercise Program: Independently Short Term Goals Time to Complete Short Term Goals: 2 weeks Short Term Goal 1: Decrase Right Shoulder pain  from8/10-0-1/10 Short Term Goal 1 Progress: Progressing toward goal Short Term Goal 2: Patient / caregiver training techniqes to decrease Right shoulder pain. Short Term Goal 2 Progress: Met Short Term Goal 3: Patient able to participate in exercises and activities to increase BUE shoulder mobility given verbal cues, demonstration and props. Short Term Goal 3 Progress: Met Long Term Goals Time to Complete Long Term Goals: 8 weeks Long Term Goal 1: Pt/caregiver will demonstrate independence with HEP for UE's Long Term Goal 1 Progress: Met Long Term Goal 2: [name] caregiver will verbalize understanding of DME/AE and modified techniques to maximize pt's safety and independence with ADL's Long Term Goal 2 Progress: Met Long Term Goal 3: Pt will report bilateral UE shoulder pain less than or equal to 5/10 with performance of HEP/ADL's Long Term Goal 3 Progress: Progressing toward goal Long Term Goal 4: Patient will be able to tie her shoes with SBA. Long Term Goal 5: Patient will be able to style her own hair by improving her shoulder sustained activity tolerance to good. Additional Long Term Goals?: Yes Long Term Goal 6: Patient will be able to fasten her bra with SBA. Long Term Goal 7: Pateint will decrease completion time on NHPT by 5 seconds for increased I with fine motor activities. Long Term Goal 8: Pateint will utilize her daily schedule with SBA. Long Term Goal 9: Patient will remember her address with min vg for increased safety.  Problem List Patient Active Problem List  Diagnoses  . DIABETES MELLITUS  . DYSLIPIDEMIA  . OBESITY, UNSPECIFIED  .  ANEMIA, IRON DEFICIENCY  . 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 HUNTINGTON'S DISEASE  . Unspecified Visual Loss  . ESSENTIAL HYPERTENSION, BENIGN  . ALLERGIC RHINITIS, SEASONAL  . ACUTE CYSTITIS  . DERMATITIS  . Pain in joint, pelvic region and thigh  . KNEE PAIN, RIGHT  . BACK PAIN, LUMBAR  . BURSITIS, HIP  . FATIGUE  . HEADACHE    . NECK PAIN  . ONYCHOMYCOSIS  . Balance problems  . Falls  . Lack of coordination  . Muscle weakness (generalized)  . Burn    End of Session Activity Tolerance: Patient tolerated treatment well General Behavior During Session: [facility] for tasks performed Cognition: [facility] for tasks performed Cognitive Impairment: unable to recall address without moderate vg. OT Plan of Care OT Home Exercise Plan: tying shoes  GO Current Status  [phone] - Self Care CK - At least 40% but less than 60% impaired, limited or restricted   Goal Status  [phone] - Self Care CJ - At least 20% but less than 40% impaired, limited or restricted    Shirlean Mylar, OTR/L  09/29/2011, 3:48 PM

## 2011-09-29 NOTE — Evaluation (Signed)
Physical Therapy Evaluation  Patient Details  Name: Victoria [name] MRN: [id number] Date of Birth: February 05, 1971  Today's Date: 09/29/2011 Time: 4098-1191 Time Calculation (min): 33 min Charges: 10 PPT, 23' Self Care Visit#: 9  of 16   Re-eval: 09/29/11    Past Medical History:  Past Medical History  Diagnosis Date  . Hypertension   . Obesity   . Dyslipidemia   . Mild cognitive impairment   . Perennial allergic rhinitis   . Diabetes mellitus   . Anxiety   . Huntington's disease    Past Surgical History: No past surgical history on file.  Subjective Symptoms/Limitations Symptoms: Pt mother reports that overall she is doing well.  She has not stummbled in the last few weeks.  She reports that she is open to going to the [facility] with her.  I thourghouly educated her mother about the benefits of continued exercise and that she could continue with exercises now that she is no longer attending PT or SLP services.  Encouraged pt and mother to continue with exercises to decrease falls.  Pain Assessment Currently in Pain?: No/denies  Sensation/Coordination/Flexibility/Functional Tests Functional Tests Functional Tests: ABC 85% - manually explained ABC secondary to cognitive delays.  Assessment RLE Strength Right Hip Flexion: 5/5 Right Hip Extension: 5/5 Right Hip ABduction: 5/5 Right Hip ADduction: 5/5 Right Knee Flexion: 5/5 Right Knee Extension: 5/5 Right Ankle Dorsiflexion: 5/5 Right Ankle Plantar Flexion: 5/5 LLE Strength Left Hip Flexion: 5/5 Left Hip Extension: 5/5 Left Hip ABduction: 5/5 Left Hip ADduction: 5/5 Left Knee Flexion: 5/5 Left Knee Extension: 5/5 Left Ankle Dorsiflexion: 5/5 Left Ankle Plantar Flexion: 5/5  Exercise/Treatments Mobility/Balance  Berg Balance Test Sit to Stand: Able to stand without using hands and stabilize independently Standing Unsupported: Able to stand safely 2 minutes Sitting with Back Unsupported but Feet Supported on Floor or  Stool: Able to sit safely and securely 2 minutes Stand to Sit: Sits safely with minimal use of hands Transfers: Able to transfer safely, minor use of hands Standing Unsupported with Eyes Closed: Able to stand 10 seconds safely Standing Ubsupported with Feet Together: Able to place feet together independently and stand 1 minute safely From Standing, Reach Forward with Outstretched Arm: Can reach confidently >25 cm (10") From Standing Position, Pick up Object from Floor: Able to pick up shoe safely and easily From Standing Position, Turn to Look Behind Over each Shoulder: Looks behind from both sides and weight shifts well Turn 360 Degrees: Able to turn 360 degrees safely in 4 seconds or less Standing Unsupported, Alternately Place Feet on Step/Stool: Able to stand independently and safely and complete 8 steps in 20 seconds Standing Unsupported, One Foot in Front: Able to place foot tandem independently and hold 30 seconds Standing on One Leg: Able to lift leg independently and hold > 10 seconds Total Score: 56     Physical Therapy Assessment and Plan PT Assessment and Plan Clinical Impression Statement: Victoria [name] has taken off 2 weeks of PT and returns today.  She remains 5/5 strength, reports 0 stumbles through this week, Berg 56/56 and ABC score 85%.  Pt is ready to be D/C w/HEP to continue.  Educated and encouraged mother to join the [facility] together to continue with mobility to prevent falls and to continue with current muscle strength and ability.  Explained to pt that she has been attending therapy for 3 hours 2x/wk which now they can attend a gym instead. She will be continuing w/SLP x1 visit and OT  x2 weeks.  PT Plan: D/C w/HEP and encouraged gym membership.    Goals PT Short Term Goals PT Short Term Goal 1: The patient and her mom will report less than or equal to 3 stumbles per day to show improved balance at home.  PT Short Term Goal 1 - Progress: Met ([name] mother reports no stumbles  in past 2 weeks) PT Short Term Goal 2: The patient and her mother will report no falls to the ground for the past two weeks to show improved balance and decreased falls. PT Short Term Goal 2 - Progress: Met PT Short Term Goal 3: The patietn will improve leg strength by 1/2 muscle grade to show improved strength. PT Short Term Goal 3 - Progress: Met PT Short Term Goal 4: The patient will increase DGI by 2 points indicating decreasead fall risk. PT Short Term Goal 4 - Progress: Met PT Short Term Goal 5: The patient will increase her Berg score by 2 points to indicate decreased fall risk. PT Short Term Goal 5 - Progress: Met PT Long Term Goals PT Long Term Goal 1: The patient will demonstrate 5/5 leg strength bil to improve her ability to perform stairs reciprocally without rails. PT Long Term Goal 1 - Progress: Met PT Long Term Goal 2: The patient and her mother will report no falls for 3 weeks to demonstrate increased balance. PT Long Term Goal 2 - Progress: Met Long Term Goal 3: The patient will increase her Berg score to 56/56 to show significant improvement in her balance and decreased risk of falls. Long Term Goal 3 Progress: Met Long Term Goal 4: The patient will increase her DGI to greater than or equal to 19 to show significant improvement and decreased risk of falls. Long Term Goal 4 Progress: Met PT Long Term Goal 5: The patient will be able to go up and down 11 stairs reciprocally without rails and without LOB to demonstrate improved strength, coordination and balance.  Long Term Goal 5 Progress: Met  Problem List Patient Active Problem List  Diagnoses  . DIABETES MELLITUS  . DYSLIPIDEMIA  . OBESITY, UNSPECIFIED  . ANEMIA, IRON DEFICIENCY  . 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 HUNTINGTON'S DISEASE  . Unspecified Visual Loss  . ESSENTIAL HYPERTENSION, BENIGN  . ALLERGIC RHINITIS, SEASONAL  . ACUTE CYSTITIS  . DERMATITIS  . Pain in joint, pelvic region and thigh  . KNEE  PAIN, RIGHT  . BACK PAIN, LUMBAR  . BURSITIS, HIP  . FATIGUE  . HEADACHE  . NECK PAIN  . ONYCHOMYCOSIS  . Balance problems  . Falls  . Lack of coordination  . Muscle weakness (generalized)  . Burn      Lynnlee Revels 09/29/2011, 3:25 PM  Physician Documentation Your signature is required to indicate approval of the treatment plan as stated above.  Please sign and either send electronically or make a copy of this report for your files and return this physician signed original.   Please mark one 1.__approve of plan  2. ___approve of plan with the following conditions.   ______________________________                                                          _____________________ Physician Signature  Date  

## 2011-10-04 ENCOUNTER — Ambulatory Visit (HOSPITAL_COMMUNITY)
Admission: RE | Admit: 2011-10-04 | Discharge: 2011-10-04 | Disposition: A | Discharge status: Home / Self Care | Payer: Medicare Other | Source: Ambulatory Visit | Attending: Family Medicine | Admitting: Family Medicine

## 2011-10-04 ENCOUNTER — Ambulatory Visit (HOSPITAL_COMMUNITY)
Admission: RE | Admit: 2011-10-04 | Discharge: 2011-10-04 | Disposition: A | Discharge status: Home / Self Care | Payer: Medicare Other | Source: Ambulatory Visit | Attending: Speech Pathology | Admitting: Speech Pathology

## 2011-10-04 DIAGNOSIS — E119 Type 2 diabetes mellitus without complications: Secondary | ICD-10-CM | POA: Diagnosis not present

## 2011-10-04 DIAGNOSIS — I10 Essential (primary) hypertension: Secondary | ICD-10-CM | POA: Diagnosis not present

## 2011-10-04 DIAGNOSIS — IMO0001 Reserved for inherently not codable concepts without codable children: Secondary | ICD-10-CM | POA: Diagnosis not present

## 2011-10-04 DIAGNOSIS — M6281 Muscle weakness (generalized): Secondary | ICD-10-CM | POA: Diagnosis not present

## 2011-10-04 DIAGNOSIS — M25519 Pain in unspecified shoulder: Secondary | ICD-10-CM | POA: Diagnosis not present

## 2011-10-04 DIAGNOSIS — M25619 Stiffness of unspecified shoulder, not elsewhere classified: Secondary | ICD-10-CM | POA: Diagnosis not present

## 2011-10-04 NOTE — Progress Notes (Signed)
Occupational Therapy Treatment  Patient Details  Name: Victoria [name] MRN: [id number] Date of Birth: 17-Mar-1971  Today's Date: 10/04/2011 Time: 8295-6213 Time Calculation (min): 37 min Selfcare 37' Visit#: 13  of 16   Re-eval: 10/13/11    Subjective Symptoms/Limitations Symptoms: S:  We practiced the shoe, but she had a hard time with it at home. Pain Assessment Currently in Pain?: No/denies  Precautions/Restrictions     Exercise/Treatments ADL:  Reviewed newly typed daily schedule.  Patient was able to read schedule much easier as opposed to the handwritten schedule.  We discussed her wake up time and go to bed time. She prefers to wake up at 10 and go to bed at 1030 or later. Her mother would like her to wake up earlier to begin her chores and assist her son, as needed.  We modified schedule to state that wake up time is 7 am and go to bed time is 11:00 pm. We read through the schedule and discussed where she could find the homework and calendar.  After discussing and actually locating both items in her binder, she continued to have difficulty remembering their location.  I modified her daily schedule to state that the calendar and homework could be located in her binder. We addressed tying her shoes.  She demonstrated SBA for vg to tie shoe form when positioned on table.  Patient was able to tie shoe model when positioned on the table with SBA.  When moved to her lap she began to have greater difficulty with sequencing of steps and became very confused and  possibly distracted.    Hand Exercises Small Pegboard: attempted grooved pegboard, which was too difficult.  Completed small pegboard with minimal difficulty using approximately 15 pegs.         Occupational Therapy Assessment and Plan OT Assessment and Plan Clinical Impression Statement: A:  Patient was able to tie shoe model when positioned on the table with SBA.  When moved to her lap she began to have greater difficulty  with sequencing of steps and became very confused and  possibly distracted.   OT Plan: P:  Follow up on use of schedule and homework.  Attempt fastening bra in front and spinning bra around.   Goals Home Exercise Program Pt will Perform Home Exercise Program: Independently Short Term Goals Time to Complete Short Term Goals: 2 weeks Short Term Goal 1: Decrase Right Shoulder pain from8/10-0-1/10 Short Term Goal 2: Patient / caregiver training techniqes to decrease Right shoulder pain. Short Term Goal 3: Patient able to participate in exercises and activities to increase BUE shoulder mobility given verbal cues, demonstration and props. Long Term Goals Time to Complete Long Term Goals: 8 weeks Long Term Goal 1: Pt/caregiver will demonstrate independence with HEP for UE's Long Term Goal 2: [name] caregiver will verbalize understanding of DME/AE and modified techniques to maximize pt's safety and independence with ADL's Long Term Goal 3: Pt will report bilateral UE shoulder pain less than or equal to 5/10 with performance of HEP/ADL's Long Term Goal 4: Patient will be able to tie her shoes with SBA. Long Term Goal 5: Patient will be able to style her own hair by improving her shoulder sustained activity tolerance to good. Additional Long Term Goals?: Yes Long Term Goal 6: Patient will be able to fasten her bra with SBA. Long Term Goal 7: Pateint will decrease completion time on NHPT by 5 seconds for increased I with fine motor activities. Long Term Goal 8:  Pateint will utilize her daily schedule with SBA. Long Term Goal 9: Patient will remember her address with min vg for increased safety.  Problem List Patient Active Problem List  Diagnoses  . DIABETES MELLITUS  . DYSLIPIDEMIA  . OBESITY, UNSPECIFIED  . ANEMIA, IRON DEFICIENCY  . 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 HUNTINGTON'S DISEASE  . Unspecified Visual Loss  . ESSENTIAL HYPERTENSION, BENIGN  . ALLERGIC RHINITIS, SEASONAL  . ACUTE  CYSTITIS  . DERMATITIS  . Pain in joint, pelvic region and thigh  . KNEE PAIN, RIGHT  . BACK PAIN, LUMBAR  . BURSITIS, HIP  . FATIGUE  . HEADACHE  . NECK PAIN  . ONYCHOMYCOSIS  . Balance problems  . Falls  . Lack of coordination  . Muscle weakness (generalized)  . Burn    End of Session Activity Tolerance: Patient tolerated treatment well General Behavior During Session: [facility] for tasks performed Cognition: [facility] for tasks performed  GO No functional reporting required  Shirlean Mylar, OTR/L  10/04/2011, 3:57 PM

## 2011-10-04 NOTE — Progress Notes (Signed)
Speech Language Pathology Treatment/Discharge Summary Patient Details  Name: Victoria [name] MRN: [id number] Date of Birth: Jun 14, 1971 Visit #: 10  Today's Date: 10/04/2011 Time: 6295-2841 Time Calculation (min): 49 min  HPI:  Symptoms/Limitations Symptoms: Maleena was very proud of the fact that she competed several pages of assigned homework. Pain Assessment Currently in Pain?: No/denies Pain Score: 0-No pain   Treatment  Cognitive-Linguistic Therapy Dysarthria Therapy Oral Motor Exercises Patient/Family Education Home Exercise Program  SLP Goals  Home Exercise SLP Goal: Patient will Perform Home Exercise Program: with supervision, verbal cues required/provided SLP Goal: Perform Home Exercise Program - Progress: Met SLP Short Term Goals SLP Short Term Goal 1: Pt will implement speech intelligibility strategies in guided sentence repetition with 90% intelligibility and mod/max cues. SLP Short Term Goal 1 - Progress: Met SLP Short Term Goal 2: Pt will decrease rate and increase vocal intensity in 5 minute conversation with mod/max cues throughout. SLP Short Term Goal 2 - Progress: Met SLP Short Term Goal 3: Pt will complete labio-lingual OMEs x 10 each with written cue provided and moderate verbal cues. SLP Short Term Goal 3 - Progress: Met SLP Short Term Goal 4: Pt will consume regular textures and thin liquids with minimal signs/symptoms of aspiration with use of compensatory strategies. SLP Short Term Goal 4 - Progress: Met SLP Short Term Goal 5: Pt will look to memory book for information when provided min verbal cues. SLP Short Term Goal 5 - Progress: Met SLP Long Term Goals SLP Long Term Goal 1: Pt will increase safetly with po at home with use of compensatory strategies as needed. SLP Long Term Goal 1 - Progress: Met SLP Long Term Goal 2: Pt will increase speech intelligibility for short conversations with family and medical team to mild impairment with use of  compensatory strategies and verbal cues.  SLP Long Term Goal 2 - Progress: Met SLP Long Term Goal 3: Provide pt/family education on effective compensatory strategies for memory in home environment. SLP Long Term Goal 3 - Progress: Met  Assessment/Plan  Patient Active Problem List  Diagnoses  . DIABETES MELLITUS  . DYSLIPIDEMIA  . OBESITY, UNSPECIFIED  . ANEMIA, IRON DEFICIENCY  . 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 HUNTINGTON'S DISEASE  . Unspecified Visual Loss  . ESSENTIAL HYPERTENSION, BENIGN  . ALLERGIC RHINITIS, SEASONAL  . ACUTE CYSTITIS  . DERMATITIS  . Pain in joint, pelvic region and thigh  . KNEE PAIN, RIGHT  . BACK PAIN, LUMBAR  . BURSITIS, HIP  . FATIGUE  . HEADACHE  . NECK PAIN  . ONYCHOMYCOSIS  . Balance problems  . Falls  . Lack of coordination  . Muscle weakness (generalized)  . Burn   SLP - End of Session Activity Tolerance: Patient tolerated treatment well General Behavior During Session: [facility] for tasks performed Cognition: Impaired, at baseline  SLP Assessment/Plan Clinical Impression Statement: Teja completed her homework and her mother was very much surprised and pleased that [name] could do it. She was given her memory notebook with sections for calendar, personal info, medical info, exercises, and homework. The book was reviewed with [name] and her mother and they were encouraged to use the book daily at home. No further SLP services indicated at this time. [name] and her mother have been asked to notify her doctor (or call me with questions) if she has difficulty swallowing.   GN D/C Status  [id number] - Motor Speech CJ - At least 20% but less than 40% impaired, limited  or restricted    Piero Mustard 10/04/2011, 5:22 PM

## 2011-10-06 ENCOUNTER — Ambulatory Visit (HOSPITAL_COMMUNITY)
Admission: RE | Admit: 2011-10-06 | Discharge: 2011-10-06 | Disposition: A | Discharge status: Home / Self Care | Payer: Medicare Other | Source: Ambulatory Visit | Attending: Psychiatry | Admitting: Psychiatry

## 2011-10-06 DIAGNOSIS — M6281 Muscle weakness (generalized): Secondary | ICD-10-CM | POA: Diagnosis not present

## 2011-10-06 DIAGNOSIS — IMO0001 Reserved for inherently not codable concepts without codable children: Secondary | ICD-10-CM | POA: Diagnosis not present

## 2011-10-06 DIAGNOSIS — E119 Type 2 diabetes mellitus without complications: Secondary | ICD-10-CM | POA: Diagnosis not present

## 2011-10-06 DIAGNOSIS — M25519 Pain in unspecified shoulder: Secondary | ICD-10-CM | POA: Diagnosis not present

## 2011-10-06 DIAGNOSIS — I10 Essential (primary) hypertension: Secondary | ICD-10-CM | POA: Diagnosis not present

## 2011-10-06 DIAGNOSIS — M25619 Stiffness of unspecified shoulder, not elsewhere classified: Secondary | ICD-10-CM | POA: Diagnosis not present

## 2011-10-06 NOTE — Progress Notes (Signed)
Occupational Therapy Treatment  Patient Details  Name: Victoria [name] MRN: [id number] Date of Birth: 1971-05-27  Today's Date: 10/06/2011 Time: 4034-7425 Time Calculation (min): 45 min Self care 45' Visit#: 14  of 16   Re-eval: 10/13/11    Subjective Symptoms/Limitations Symptoms: S:  I didnt get up at 7:00 today.  Momma didnt get me up.  Precautions/Restrictions     Exercise/Treatments Selfcare:  [name] is not able to don her bra herself.  Her mother assists her don her bra and fasten it.  I demonstrated technique to fasten bra in front, spin it around, and put arms through openings.  Practiced this technique with min pa and max vg x 4 reps.  I then demonstrated fastening prior to donning and then donning overhead like a tshirt.  Practiced this technique with min pa x max vg.  [name] was able to complete 2nd technique easier than the first, but both were quite difficult and she became frustrated.  I discussed the possibility of using a stretchy bra that does not have fasteners that she can don over her head as a possible easier option. We then transitioned to tying her shoes.  [name] does not have adequate dynamic sitting balance to lean over and tie her shoes safely and continues to require max vg initially to tie her shoe.  We discussed the use of elastic laces in her current shoes for increased safety.         Occupational Therapy Assessment and Plan OT Assessment and Plan Clinical Impression Statement: A:  Patient requires max vg and min pa to don bra using 2 different alternative techniques. OT Plan: P:  Attempt haircare I[place].   Goals Home Exercise Program Pt will Perform Home Exercise Program: Independently Short Term Goals Time to Complete Short Term Goals: 2 weeks Short Term Goal 1: Decrase Right Shoulder pain from8/10-0-1/10 Short Term Goal 2: Patient / caregiver training techniqes to decrease Right shoulder pain. Short Term Goal 3: Patient able to participate in exercises  and activities to increase BUE shoulder mobility given verbal cues, demonstration and props. Long Term Goals Time to Complete Long Term Goals: 8 weeks Long Term Goal 1: Pt/caregiver will demonstrate independence with HEP for UE's Long Term Goal 2: [name] caregiver will verbalize understanding of DME/AE and modified techniques to maximize pt's safety and independence with ADL's Long Term Goal 3: Pt will report bilateral UE shoulder pain less than or equal to 5/10 with performance of HEP/ADL's Long Term Goal 4: Patient will be able to tie her shoes with SBA. Long Term Goal 5: Patient will be able to style her own hair by improving her shoulder sustained activity tolerance to good. Additional Long Term Goals?: Yes Long Term Goal 6: Patient will be able to fasten her bra with SBA. Long Term Goal 7: Pateint will decrease completion time on NHPT by 5 seconds for increased I with fine motor activities. Long Term Goal 8: Pateint will utilize her daily schedule with SBA. Long Term Goal 9: Patient will remember her address with min vg for increased safety.  Problem List Patient Active Problem List  Diagnoses  . DIABETES MELLITUS  . DYSLIPIDEMIA  . OBESITY, UNSPECIFIED  . ANEMIA, IRON DEFICIENCY  . 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 HUNTINGTON'S DISEASE  . Unspecified Visual Loss  . ESSENTIAL HYPERTENSION, BENIGN  . ALLERGIC RHINITIS, SEASONAL  . ACUTE CYSTITIS  . DERMATITIS  . Pain in joint, pelvic region and thigh  . KNEE PAIN, RIGHT  .  BACK PAIN, LUMBAR  . BURSITIS, HIP  . FATIGUE  . HEADACHE  . NECK PAIN  . ONYCHOMYCOSIS  . Balance problems  . Falls  . Lack of coordination  . Muscle weakness (generalized)  . Burn    End of Session Activity Tolerance: Patient tolerated treatment well General Behavior During Session: [facility] for tasks performed Cognition: Impaired, at baseline Cognitive Impairment: unable to complete badl task that was demonstrated without step by step  instruction.  GO No functional reporting required  Shirlean Mylar, OTR/L  10/06/2011, 2:10 PM

## 2011-10-11 ENCOUNTER — Telehealth (HOSPITAL_COMMUNITY): Payer: Self-pay | Admitting: Specialist

## 2011-10-11 ENCOUNTER — Ambulatory Visit (HOSPITAL_COMMUNITY): Payer: Medicare Other | Admitting: Specialist

## 2011-10-13 ENCOUNTER — Ambulatory Visit (HOSPITAL_COMMUNITY)
Admission: RE | Admit: 2011-10-13 | Discharge: 2011-10-13 | Disposition: A | Discharge status: Home / Self Care | Payer: Medicare Other | Source: Ambulatory Visit | Attending: Family Medicine | Admitting: Family Medicine

## 2011-10-13 ENCOUNTER — Telehealth: Payer: Self-pay | Admitting: Family Medicine

## 2011-10-13 DIAGNOSIS — E119 Type 2 diabetes mellitus without complications: Secondary | ICD-10-CM | POA: Diagnosis not present

## 2011-10-13 DIAGNOSIS — M25519 Pain in unspecified shoulder: Secondary | ICD-10-CM | POA: Diagnosis not present

## 2011-10-13 DIAGNOSIS — M6281 Muscle weakness (generalized): Secondary | ICD-10-CM | POA: Diagnosis not present

## 2011-10-13 DIAGNOSIS — I10 Essential (primary) hypertension: Secondary | ICD-10-CM | POA: Diagnosis not present

## 2011-10-13 DIAGNOSIS — IMO0001 Reserved for inherently not codable concepts without codable children: Secondary | ICD-10-CM | POA: Diagnosis not present

## 2011-10-13 DIAGNOSIS — M25619 Stiffness of unspecified shoulder, not elsewhere classified: Secondary | ICD-10-CM | POA: Diagnosis not present

## 2011-10-13 NOTE — Progress Notes (Signed)
Occupational Therapy Treatment  Patient Details  Name: Victoria [name] MRN: [id number] Date of Birth: 21-Jun-1970  Today's Date: 10/13/2011 Time: 2536-6440 Time Calculation (min): 53 min Self care 65' Visit#: 15  of 16   Re-eval: 10/13/11    Subjective Symptoms/Limitations Symptoms: S:  I can't remember my address Pain Assessment Currently in Pain?: Yes Pain Score:   7 Pain Location: Shoulder Pain Orientation: Right Pain Type: Acute pain  Precautions/Restrictions     Exercise/Treatments Selfcare:  Practiced tying shoe form and required SBA for step by step verbal guidance.  Discussed possibility of obtaining an id bracelet or necklace for emergencies as patient is not able to [facility] recall her address. Patient will require SBA with all self care due to memory deficits.  Cognitive Exercises Handwriting: practiced writing her address - she requires max vg to recall and write her address.   Listing: wrote a shopping list for [employer].  [name] required max vg to spell all words and to correctly form letters.        Occupational Therapy Assessment and Plan OT Assessment and Plan Clinical Impression Statement: A:  Patient's independence with BADLs is limited by her cognitive impairments. OT Plan: P:  DC this date.   Goals Home Exercise Program Pt will Perform Home Exercise Program: Independently Short Term Goals Time to Complete Short Term Goals: 2 weeks Short Term Goal 1: Decrase Right Shoulder pain from8/10-0-1/10 Short Term Goal 1 Progress: Progressing toward goal Short Term Goal 2: Patient / caregiver training techniqes to decrease Right shoulder pain. Short Term Goal 2 Progress: Met Short Term Goal 3: Patient able to participate in exercises and activities to increase BUE shoulder mobility given verbal cues, demonstration and props. Short Term Goal 3 Progress: Met Long Term Goals Time to Complete Long Term Goals: 8 weeks Long Term Goal 1: Pt/caregiver will demonstrate  independence with HEP for UE's Long Term Goal 1 Progress: Met Long Term Goal 2: [name] caregiver will verbalize understanding of DME/AE and modified techniques to maximize pt's safety and independence with ADL's Long Term Goal 2 Progress: Met Long Term Goal 3: Pt will report bilateral UE shoulder pain less than or equal to 5/10 with performance of HEP/ADL's Long Term Goal 3 Progress: Progressing toward goal Long Term Goal 4: Patient will be able to tie her shoes with SBA. Long Term Goal 4 Progress: Progressing toward goal Long Term Goal 5: Patient will be able to style her own hair by improving her shoulder sustained activity tolerance to good. Long Term Goal 5 Progress: Progressing toward goal Additional Long Term Goals?: Yes Long Term Goal 6: Patient will be able to fasten her bra with SBA. Long Term Goal 6 Progress: Progressing toward goal Long Term Goal 7: Pateint will decrease completion time on NHPT by 5 seconds for increased I with fine motor activities. Long Term Goal 7 Progress: Progressing toward goal Long Term Goal 8: Pateint will utilize her daily schedule with SBA. Long Term Goal 8 Progress: Progressing toward goal Long Term Goal 9: Patient will remember her address with min vg for increased safety. Long Term Goal 9 Progress: Progressing toward goal  Problem List Patient Active Problem List  Diagnoses  . DIABETES MELLITUS  . DYSLIPIDEMIA  . OBESITY, UNSPECIFIED  . ANEMIA, IRON DEFICIENCY  . 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 HUNTINGTON'S DISEASE  . Unspecified Visual Loss  . ESSENTIAL HYPERTENSION, BENIGN  . ALLERGIC RHINITIS, SEASONAL  . ACUTE CYSTITIS  . DERMATITIS  . Pain in  joint, pelvic region and thigh  . KNEE PAIN, RIGHT  . BACK PAIN, LUMBAR  . BURSITIS, HIP  . FATIGUE  . HEADACHE  . NECK PAIN  . ONYCHOMYCOSIS  . Balance problems  . Falls  . Lack of coordination  . Muscle weakness (generalized)  . Burn    End of Session Activity Tolerance:  Patient tolerated treatment well General Behavior During Session: [facility] for tasks performed Cognition: [facility] for tasks performed Cognitive Impairment: impaired  GO Current Status  [id number] - Self Care CL - At least 60% but less than 80% impaired, limited or restricted   Goal Status  [id number] - Self Care CL - At least 60% but less than 80% impaired, limited or restricted    D/C Status  [id number] - Self Care / CL - At least 60% but less than 80% impaired, limited or restricted CL - At least 60% but less than 80% impaired, limited or restricted     Shirlean Mylar, OTR/L  10/13/2011, 2:47 PM

## 2011-10-14 NOTE — Telephone Encounter (Signed)
I don't see any shampoo (Clobex 0.05%) shampoo is on the old list from 2010. That is the only one I see. Wanting refill

## 2011-10-17 NOTE — Telephone Encounter (Signed)
pls refill the clobex x 1

## 2011-10-18 ENCOUNTER — Ambulatory Visit (HOSPITAL_COMMUNITY): Payer: Medicare Other | Admitting: Occupational Therapy

## 2011-10-18 MED ORDER — CLOBETASOL PROPIONATE 0.05 % EX SHAM: MEDICATED_SHAMPOO | CUTANEOUS | Status: DC

## 2011-10-18 NOTE — Telephone Encounter (Signed)
Refilled x1 

## 2011-10-21 ENCOUNTER — Other Ambulatory Visit: Payer: Self-pay | Admitting: Family Medicine

## 2011-10-28 ENCOUNTER — Other Ambulatory Visit: Payer: Self-pay | Admitting: Family Medicine

## 2011-11-25 ENCOUNTER — Other Ambulatory Visit: Payer: Self-pay | Admitting: Family Medicine

## 2012-01-05 ENCOUNTER — Telehealth: Payer: Self-pay | Admitting: Family Medicine

## 2012-01-05 NOTE — Telephone Encounter (Signed)
Should pt be checking blood sugar and how often.

## 2012-01-05 NOTE — Telephone Encounter (Signed)
Once daily

## 2012-01-06 ENCOUNTER — Other Ambulatory Visit: Payer: Self-pay

## 2012-01-06 MED ORDER — LANCETS MISC: 1.0000 | Freq: Once | Status: DC

## 2012-01-06 MED ORDER — GLUCOSE BLOOD VI STRP: ORAL_STRIP | Status: DC

## 2012-01-06 NOTE — Telephone Encounter (Signed)
Supplies sent

## 2012-01-19 ENCOUNTER — Ambulatory Visit (INDEPENDENT_AMBULATORY_CARE_PROVIDER_SITE_OTHER): Payer: Medicare Other | Admitting: Family Medicine

## 2012-01-19 ENCOUNTER — Telehealth: Payer: Self-pay | Admitting: Family Medicine

## 2012-01-19 ENCOUNTER — Encounter: Payer: Self-pay | Admitting: Family Medicine

## 2012-01-19 VITALS — BP 118/80 | HR 92 | Resp 16 | Ht 63.0 in | Wt 177.0 lb

## 2012-01-19 DIAGNOSIS — E119 Type 2 diabetes mellitus without complications: Secondary | ICD-10-CM | POA: Diagnosis not present

## 2012-01-19 DIAGNOSIS — M949 Disorder of cartilage, unspecified: Secondary | ICD-10-CM | POA: Diagnosis not present

## 2012-01-19 DIAGNOSIS — R920 Mammographic microcalcification found on diagnostic imaging of breast: Secondary | ICD-10-CM | POA: Diagnosis not present

## 2012-01-19 DIAGNOSIS — R5383 Other fatigue: Secondary | ICD-10-CM | POA: Diagnosis not present

## 2012-01-19 DIAGNOSIS — R51 Headache: Secondary | ICD-10-CM

## 2012-01-19 DIAGNOSIS — R7301 Impaired fasting glucose: Secondary | ICD-10-CM | POA: Diagnosis not present

## 2012-01-19 DIAGNOSIS — N3000 Acute cystitis without hematuria: Secondary | ICD-10-CM | POA: Diagnosis not present

## 2012-01-19 DIAGNOSIS — M899 Disorder of bone, unspecified: Secondary | ICD-10-CM | POA: Diagnosis not present

## 2012-01-19 DIAGNOSIS — R5381 Other malaise: Secondary | ICD-10-CM

## 2012-01-19 DIAGNOSIS — N939 Abnormal uterine and vaginal bleeding, unspecified: Secondary | ICD-10-CM | POA: Diagnosis not present

## 2012-01-19 DIAGNOSIS — D509 Iron deficiency anemia, unspecified: Secondary | ICD-10-CM

## 2012-01-19 DIAGNOSIS — N926 Irregular menstruation, unspecified: Secondary | ICD-10-CM | POA: Diagnosis not present

## 2012-01-19 DIAGNOSIS — I10 Essential (primary) hypertension: Secondary | ICD-10-CM

## 2012-01-19 DIAGNOSIS — E785 Hyperlipidemia, unspecified: Secondary | ICD-10-CM

## 2012-01-19 DIAGNOSIS — E669 Obesity, unspecified: Secondary | ICD-10-CM

## 2012-01-19 MED ORDER — CLOBETASOL PROPIONATE 0.05 % EX SHAM: MEDICATED_SHAMPOO | CUTANEOUS | Status: DC

## 2012-01-19 MED ORDER — GLUCOSE BLOOD VI STRP: ORAL_STRIP | Status: AC

## 2012-01-19 MED ORDER — NORETHINDRONE 0.35 MG PO TABS: 1.0000 | ORAL_TABLET | Freq: Every day | ORAL | Status: DC

## 2012-01-19 MED ORDER — CIPROFLOXACIN HCL 500 MG PO TABS: 500.0000 mg | ORAL_TABLET | Freq: Two times a day (BID) | ORAL | Status: AC

## 2012-01-19 MED ORDER — CETIRIZINE HCL 10 MG PO TABS: ORAL_TABLET | ORAL | Status: DC

## 2012-01-19 MED ORDER — LANCETS MISC: 1.0000 | Freq: Once | Status: DC

## 2012-01-19 NOTE — Telephone Encounter (Signed)
sent 

## 2012-01-19 NOTE — Patient Instructions (Addendum)
Annual wellness mid November or after.  CBC, hBA1C, tSH, cmp and EGFR, Vit D today and microalbumin  Congrats on weight loss.  You are referred for right breast mammogram and also for ultrasound since you are having 2 cycles per month  You need to start birth control pills on the first day of your next cycle , these are sent in  You appear to have a urinary tract infection I will send in medication. Please ensure you drinkl a lot of water and void often  HBa1C and fasting lipids 3 to 5 days before next visit  Shampoo will be refilled

## 2012-01-20 LAB — COMPLETE METABOLIC PANEL WITH GFR
AST: 15 U/L (ref 0–37)
Alkaline Phosphatase: 53 U/L (ref 39–117)
GFR, Est Non African American: 73 mL/min
Glucose, Bld: 123 mg/dL — ABNORMAL HIGH (ref 70–99)
Potassium: 4.8 mEq/L (ref 3.5–5.3)
Sodium: 145 mEq/L (ref 135–145)
Total Bilirubin: 0.3 mg/dL (ref 0.3–1.2)
Total Protein: 7.6 g/dL (ref 6.0–8.3)

## 2012-01-20 LAB — MICROALBUMIN / CREATININE URINE RATIO
Creatinine, Urine: 196.3 mg/dL
Microalb Creat Ratio: 4.4 mg/g (ref 0.0–30.0)

## 2012-01-20 LAB — HEMOGLOBIN A1C
Hgb A1c MFr Bld: 6.3 % — ABNORMAL HIGH (ref <5.7)
Mean Plasma Glucose: 134 mg/dL — ABNORMAL HIGH (ref <117)

## 2012-01-20 LAB — CBC WITH DIFFERENTIAL/PLATELET
Eosinophils Absolute: 0.1 10*3/uL (ref 0.0–0.7)
Hemoglobin: 12.1 g/dL (ref 12.0–15.0)
Lymphocytes Relative: 41 % (ref 12–46)
Lymphs Abs: 2.3 10*3/uL (ref 0.7–4.0)
MCH: 27.1 pg (ref 26.0–34.0)
Monocytes Relative: 5 % (ref 3–12)
Neutro Abs: 3 10*3/uL (ref 1.7–7.7)
Neutrophils Relative %: 52 % (ref 43–77)
Platelets: 307 10*3/uL (ref 150–400)
RBC: 4.46 MIL/uL (ref 3.87–5.11)
WBC: 5.7 10*3/uL (ref 4.0–10.5)

## 2012-01-20 LAB — VITAMIN D 25 HYDROXY (VIT D DEFICIENCY, FRACTURES): Vit D, 25-Hydroxy: 32 ng/mL (ref 30–89)

## 2012-01-22 NOTE — Assessment & Plan Note (Signed)
Controlled, no change in medication  

## 2012-01-22 NOTE — Assessment & Plan Note (Signed)
3 month h/o frequent menses, will cycle with OCP and obtain imaging studies

## 2012-01-22 NOTE — Assessment & Plan Note (Signed)
Chronic and unchanged ?

## 2012-01-22 NOTE — Assessment & Plan Note (Signed)
Controlled, no change in medication DASH diet and commitment to daily physical activity for a minimum of 30 minutes discussed and encouraged, as a part of hypertension management. The importance of attaining a healthy weight is also discussed.  

## 2012-01-22 NOTE — Assessment & Plan Note (Signed)
Hyperlipidemia:Low fat diet discussed and encouraged.  Updated lab needed 

## 2012-01-22 NOTE — Progress Notes (Signed)
  Subjective:    Patient ID: Victoria [name], female    DOB: [birth year], 41 y.o.   MRN: [id number]  HPI The PT is here for follow up and re-evaluation of chronic medical conditions, medication management and review of any available recent lab and radiology data.  Preventive health is updated, specifically  Cancer screening and Immunization.   Questions or concerns regarding consultations or procedures which the PT has had in the interim are  addressed. The PT denies any adverse reactions to current medications since the last visit.  C/o menses twice per month for the past 3 month, concerned about this, no change in character/heaviness of flow C/o headaches, no change from the past per her Mom, pt receives tylenol occasionally for the complaint. She is followed by neurology [facility] for Huntington's and this complaint has been mentioned there as well       Review of Systems See HPI Denies recent fever or chills. Denies sinus pressure, nasal congestion, ear pain or sore throat. Denies chest congestion, productive cough or wheezing. Denies chest pains, palpitations and leg swelling Denies abdominal pain, nausea, vomiting,diarrhea or constipation.   Denies dysuria, frequency, hesitancy or incontinence. Denies joint pain, swelling and limitation in mobility. Denies seizures, numbness, or tingling. Denies depression, anxiety or insomnia. Denies skin break down or rash.        Objective:   Physical Exam  Patient alert and oriented and in no cardiopulmonary distress.Moderately mentally reterded. Mother is reliable historian and caregiver  HEENT: No facial asymmetry, EOMI, no sinus tenderness,  oropharynx pink and moist.  Neck supple no adenopathy.  Chest: Clear to auscultation bilaterally.  CVS: S1, S2 no murmurs, no S3.  ABD: Soft non tender. Bowel sounds normal.  Ext: No edema  MS: Adequate ROM spine, shoulders, hips and knees.  Skin: Intact, no ulcerations or rash  noted.  Psych: Good eye contact, blunted  affect. Memory iloss not anxious or depressed appearing.  CNS: CN 2-12 intact, power, tone and sensation normal throughout.       Assessment & Plan:

## 2012-01-24 ENCOUNTER — Other Ambulatory Visit (HOSPITAL_COMMUNITY): Payer: Self-pay | Admitting: Family Medicine

## 2012-01-24 ENCOUNTER — Ambulatory Visit (HOSPITAL_COMMUNITY)
Admission: RE | Admit: 2012-01-24 | Discharge: 2012-01-24 | Disposition: A | Discharge status: Home / Self Care | Payer: Medicare Other | Source: Ambulatory Visit | Attending: Family Medicine | Admitting: Family Medicine

## 2012-01-24 ENCOUNTER — Other Ambulatory Visit (HOSPITAL_COMMUNITY): Payer: Medicare Other

## 2012-01-24 DIAGNOSIS — D259 Leiomyoma of uterus, unspecified: Secondary | ICD-10-CM | POA: Insufficient documentation

## 2012-01-24 DIAGNOSIS — N926 Irregular menstruation, unspecified: Secondary | ICD-10-CM | POA: Diagnosis not present

## 2012-01-24 DIAGNOSIS — N939 Abnormal uterine and vaginal bleeding, unspecified: Secondary | ICD-10-CM | POA: Insufficient documentation

## 2012-02-01 ENCOUNTER — Encounter (HOSPITAL_COMMUNITY): Payer: Medicare Other

## 2012-02-08 ENCOUNTER — Ambulatory Visit (HOSPITAL_COMMUNITY)
Admission: RE | Admit: 2012-02-08 | Discharge: 2012-02-08 | Disposition: A | Discharge status: Home / Self Care | Payer: Medicare Other | Source: Ambulatory Visit | Attending: Family Medicine | Admitting: Family Medicine

## 2012-02-08 DIAGNOSIS — R928 Other abnormal and inconclusive findings on diagnostic imaging of breast: Secondary | ICD-10-CM | POA: Diagnosis not present

## 2012-02-08 DIAGNOSIS — G10 Huntington's disease: Secondary | ICD-10-CM | POA: Diagnosis not present

## 2012-03-22 ENCOUNTER — Other Ambulatory Visit: Payer: Self-pay | Admitting: Family Medicine

## 2012-04-27 ENCOUNTER — Other Ambulatory Visit: Payer: Self-pay | Admitting: Family Medicine

## 2012-04-30 DIAGNOSIS — E785 Hyperlipidemia, unspecified: Secondary | ICD-10-CM | POA: Diagnosis not present

## 2012-04-30 DIAGNOSIS — R7301 Impaired fasting glucose: Secondary | ICD-10-CM | POA: Diagnosis not present

## 2012-04-30 LAB — LIPID PANEL
Cholesterol: 139 mg/dL (ref 0–200)
Total CHOL/HDL Ratio: 4.1 Ratio
Triglycerides: 110 mg/dL (ref <150)

## 2012-04-30 LAB — HEMOGLOBIN A1C: Mean Plasma Glucose: 128 mg/dL — ABNORMAL HIGH (ref <117)

## 2012-05-01 ENCOUNTER — Ambulatory Visit (INDEPENDENT_AMBULATORY_CARE_PROVIDER_SITE_OTHER): Payer: Medicare Other | Admitting: Family Medicine

## 2012-05-01 ENCOUNTER — Encounter: Payer: Self-pay | Admitting: Family Medicine

## 2012-05-01 VITALS — BP 126/82 | HR 78 | Resp 18 | Ht 63.0 in | Wt 176.1 lb

## 2012-05-01 DIAGNOSIS — E119 Type 2 diabetes mellitus without complications: Secondary | ICD-10-CM | POA: Diagnosis not present

## 2012-05-01 DIAGNOSIS — Z23 Encounter for immunization: Secondary | ICD-10-CM

## 2012-05-01 DIAGNOSIS — E785 Hyperlipidemia, unspecified: Secondary | ICD-10-CM | POA: Diagnosis not present

## 2012-05-01 DIAGNOSIS — Z0000 Encounter for general adult medical examination without abnormal findings: Secondary | ICD-10-CM

## 2012-05-01 DIAGNOSIS — E669 Obesity, unspecified: Secondary | ICD-10-CM | POA: Diagnosis not present

## 2012-05-01 NOTE — Patient Instructions (Addendum)
Pelvic and breast in 4 month, please call if you need me before.  Flu vaccine today.  Blood sugar and weight have improved, congrats  Keep dancing every day, and enjoy watching the TV  Start following the dates on the calender   HBA1C and chem 7 non fast in 4 month  STOP benazepril this may be causing your cough  You will change how you take the birth control pills so you bleed every 3 month, take 3 cycles for 3 weeks only, the first 3 weeks, then on cycle 3 take the whole 4 weeks, you will bleed every 3 month  Your Mom needs to be formally made your health care power of attorney because of your illness  [facility] call [facility] to make appointment for your eye exam

## 2012-05-01 NOTE — Progress Notes (Signed)
Subjective:    Patient ID: Victoria [name], female    DOB: 22-Jul-1970, 41 y.o.   MRN: [id number]  HPI  Preventive Screening-Counseling & Management   Patient present here today for a Medicare annual wellness visit.   Current Problems (verified)   Medications Prior to Visit Allergies (verified)   PAST HISTORY  Family History One half brother also with huntington's age 104. Father deceased iat age 39 had Huntington's,mom thinks he comited suicide, mother living, no cancer , heart disease or stroke. She also is a diabetic Social History Divorced and single Mom of 1 son age 77, lives with her mom and has become disabled on neurologic grounds since 2010. Never cigarettes, alcohol or illicit drugs.   Risk Factors  Current exercise habits:  Dancing daily  For 30 minutes  Dietary issues discussed:Drinking water and eating more vegetables   Cardiac risk factors: DM  Depression Screen  (Note: if answer to either of the following is "Yes", a more complete depression screening is indicated)   Over the past two weeks, have you felt down, depressed or hopeless? No  Over the past two weeks, have you felt little interest or pleasure in doing things? No  Have you lost interest or pleasure in daily life? No  Do you often feel hopeless? No  Do you cry easily over simple problems? No   Activities of Daily Living  In your present state of health, do you have any difficulty performing the following activities?  Driving?: Unable Managing money?:yes, mother is responsible for patient's finances, due to her neurologic illness, [name] is unable to be responsible for her money Feeding yourself?:No Getting from bed to chair?:No Climbing a flight of stairs?:yes Preparing food and eating?:No Bathing or showering?yes gets  Help from Mom with hygiene issues Getting dressed?:No Getting to the toilet?:No Using the toilet?:No Moving around from place to place?: No  Fall Risk Assessment In the  past year have you fallen or had a near fall?:yes once , may have lost her balance Are you currently taking any medications that make you dizziness?:No   Hearing Difficulties: No Do you often ask people to speak up or repeat themselves?:No Do you experience ringing or noises in your ears?:No Do you have difficulty understanding soft or whispered voices?:No  Cognitive Testing  Alert? Yes Normal Appearance?Yes  Oriented to person? Yes Place? Yes  Time? Yes  Displays appropriate judgment?not at all times Can read the correct time from a watch face? no Are you having problems remembering things?yes  Advanced Directives have been discussed with the patient?Discussion started with her Mother, who will need to follow up on this with [name].She is a full code   List the Names of Other Physician/Practitioners you currently use: [facility] and Dr. Geoffery Spruce  neurologist at [facility]     Assessment:    Annual Wellness Exam   Plan:    During the course of the visit the patient was educated and counseled about appropriate screening and preventive services including:  A healthy diet is rich in fruit, vegetables and whole grains. Poultry fish, nuts and beans are a healthy choice for protein rather then red meat. A low sodium diet and drinking 64 ounces of water daily is generally recommended. Oils and sweet should be limited. Carbohydrates especially for those who are diabetic or overweight, should be limited to 30-45 gram per meal. It is important to eat on a regular schedule, at least 3 times daily. Snacks should be primarily fruits, vegetables or  nuts. It is important that you exercise regularly at least 30 minutes 5 times a week. If you develop chest pain, have severe difficulty breathing, or feel very tired, stop exercising immediately and seek medical attention  Immunization reviewed and updated. Cancer screening reviewed and updated, needs rept mammogram , which is scheduled at time of  checkout, had calcifications earlier in the year    Patient Instructions (the written plan) was given to the patient.  Medicare Attestation  I have personally reviewed:  The patient's medical and social history  Their use of alcohol, tobacco or illicit drugs  Their current medications and supplements  The patient's functional ability including ADLs,fall risks, home safety risks, cognitive, and hearing and visual impairment  Diet and physical activities  Evidence for depression or mood disorders  The patient's weight, height, BMI, and visual acuity have been recorded in the chart. I have made referrals, counseling, and provided education to the patient based on review of the above and I have provided the patient with a written personalized care plan for preventive services.     Review of Systems     Objective:   Physical Exam        Assessment & Plan:

## 2012-05-02 ENCOUNTER — Other Ambulatory Visit: Payer: Self-pay | Admitting: Family Medicine

## 2012-05-02 DIAGNOSIS — Z09 Encounter for follow-up examination after completed treatment for conditions other than malignant neoplasm: Secondary | ICD-10-CM

## 2012-05-03 ENCOUNTER — Telehealth: Payer: Self-pay | Admitting: Family Medicine

## 2012-05-03 NOTE — Telephone Encounter (Signed)
Mother is aware. 

## 2012-05-09 DIAGNOSIS — Z0000 Encounter for general adult medical examination without abnormal findings: Secondary | ICD-10-CM | POA: Insufficient documentation

## 2012-05-09 NOTE — Assessment & Plan Note (Signed)
Annual wellness exam performed and documented. Pt needs a mammogram scheduled and this will be done at checkout.  End of life issues need to be carefully considered by her mother and discussed with [name]. She is a full code and her current physical health status is fairly stable at this time. She does show mild deterioration in her ability to function independently, as well as in her memory .

## 2012-05-16 ENCOUNTER — Ambulatory Visit (HOSPITAL_COMMUNITY)
Admission: RE | Admit: 2012-05-16 | Discharge: 2012-05-16 | Disposition: A | Discharge status: Home / Self Care | Payer: Medicare Other | Source: Ambulatory Visit | Attending: Family Medicine | Admitting: Family Medicine

## 2012-05-16 DIAGNOSIS — R928 Other abnormal and inconclusive findings on diagnostic imaging of breast: Secondary | ICD-10-CM | POA: Diagnosis not present

## 2012-05-16 DIAGNOSIS — Z09 Encounter for follow-up examination after completed treatment for conditions other than malignant neoplasm: Secondary | ICD-10-CM | POA: Diagnosis not present

## 2012-05-26 ENCOUNTER — Other Ambulatory Visit: Payer: Self-pay | Admitting: Family Medicine

## 2012-06-01 ENCOUNTER — Other Ambulatory Visit: Payer: Self-pay | Admitting: Family Medicine

## 2012-06-04 ENCOUNTER — Other Ambulatory Visit: Payer: Self-pay | Admitting: Family Medicine

## 2012-06-07 ENCOUNTER — Ambulatory Visit (INDEPENDENT_AMBULATORY_CARE_PROVIDER_SITE_OTHER): Payer: Medicare Other | Admitting: Family Medicine

## 2012-06-07 VITALS — BP 122/82 | HR 83 | Resp 16 | Wt 174.1 lb

## 2012-06-07 DIAGNOSIS — I10 Essential (primary) hypertension: Secondary | ICD-10-CM | POA: Diagnosis not present

## 2012-06-07 DIAGNOSIS — E119 Type 2 diabetes mellitus without complications: Secondary | ICD-10-CM

## 2012-06-07 DIAGNOSIS — E785 Hyperlipidemia, unspecified: Secondary | ICD-10-CM

## 2012-06-07 DIAGNOSIS — R21 Rash and other nonspecific skin eruption: Secondary | ICD-10-CM | POA: Diagnosis not present

## 2012-06-07 DIAGNOSIS — N939 Abnormal uterine and vaginal bleeding, unspecified: Secondary | ICD-10-CM | POA: Diagnosis not present

## 2012-06-07 DIAGNOSIS — N926 Irregular menstruation, unspecified: Secondary | ICD-10-CM

## 2012-06-07 MED ORDER — SULFAMETHOXAZOLE-TRIMETHOPRIM 800-160 MG PO TABS: 1.0000 | ORAL_TABLET | Freq: Two times a day (BID) | ORAL | Status: DC

## 2012-06-07 MED ORDER — LEVONORGEST-ETH ESTRAD 91-DAY 0.15-0.03 MG PO TABS: 1.0000 | ORAL_TABLET | Freq: Every day | ORAL | Status: DC

## 2012-06-07 NOTE — Patient Instructions (Addendum)
F/u as before  Antibiotic course prescribed for rash on back  PLEASE DO NOT scratch  Complete current pack of birth control pills till done, new is seasonale which you take every day till complete, should bleed every 3 months once you have taken consistently at the same time every day for 12 weeks

## 2012-06-07 NOTE — Progress Notes (Signed)
  Subjective:    Patient ID: Victoria [name], female    DOB: May 22, 1971, 41 y.o.   MRN: [id number]  HPI 3 day h/o pruritic rash on the back, no purulent drainage, mother became aware of the lesion 1 day ago, and daughter is unable to provide a history due to neurologic illness. No fever, no pus seen in the area. Activity otherwise normal  Review of Systems    See HPI Denies recent fever or chills. Denies sinus pressure, nasal congestion, ear pain or sore throat. Denies chest congestion, productive cough or wheezing. Denies chest pains,  and leg swelling Denies abdominal pain, nausea, vomiting,diarrhea or constipation.        Objective:   Physical Exam Patient alert and oriented and in no cardiopulmonary distress.  HEENT: No facial asymmetry, EOMI, no sinus tenderness,  oropharynx pink and moist.  Neck supple no adenopathy.  Chest: Clear to auscultation bilaterally.  CVS: S1, S2 no murmurs, no S3.  ABD: Soft non tender. Bowel sounds normal.  Ext: No edema    Skin: erythematous rash on mid back, open uilcer approx size 2cm, no purulent drainage from the area      Assessment & Plan:

## 2012-06-14 ENCOUNTER — Encounter: Payer: Self-pay | Admitting: Family Medicine

## 2012-06-14 ENCOUNTER — Ambulatory Visit (INDEPENDENT_AMBULATORY_CARE_PROVIDER_SITE_OTHER): Payer: Medicare Other | Admitting: Family Medicine

## 2012-06-14 VITALS — BP 110/80 | HR 97 | Resp 16 | Wt 172.0 lb

## 2012-06-14 DIAGNOSIS — R21 Rash and other nonspecific skin eruption: Secondary | ICD-10-CM

## 2012-06-14 NOTE — Patient Instructions (Signed)
Continue the antibiotics  Use the topical bacitracin cream when you change the bandage Keep covered until the middle part heals Call if pus starts to ooze out  Keep previous f/u appt with Dr. Lodema Hong

## 2012-06-14 NOTE — Assessment & Plan Note (Signed)
Appears to have had superinfected excoriation, she tends to pick at skin and had false nails. Continue bactrim in healing stages, to prevent her from touching area, bacitracin applied and tegaderm to keep bandage in place, mother instructed on wound care.

## 2012-06-14 NOTE — Progress Notes (Signed)
  Subjective:    Patient ID: Victoria [name], female    DOB: [birth year], 41 y.o.   MRN: [id number]  HPI   Pt seen last week for rash on back prescribed bactrim, mother has been watching it and felt it looked worse, she states it gets irritated, no drainage from lesions   Review of Systems  GEN- denies fatigue, fever, weight loss,weakness, recent illness HEENT- denies eye drainage, change in vision, nasal discharge, CVS- denies chest pain, palpitations RESP- denies SOB, cough, wheeze Skin- rash      Objective:   Physical Exam GEN-NAD,alert and oriented x 3, cognitive impairment Skin- multiple liner excoriations across mid back extending to buttocks in different stages of healing, in center of back open scab about 1.5cm with collapsed blister in center, hardening scabs surrounding, no induration, no fluctuant areas       Assessment & Plan:

## 2012-06-18 ENCOUNTER — Telehealth: Payer: Self-pay

## 2012-06-18 NOTE — Telephone Encounter (Signed)
Let pt know the script will not be covered, mother had a hard time giving her the med for 3 weeks then changing , she may use regular birth control pills and bleed once per month, or just no contraceptives. Please let me know what they want to do

## 2012-06-21 ENCOUNTER — Other Ambulatory Visit: Payer: Self-pay | Admitting: Family Medicine

## 2012-06-21 MED ORDER — NORETHINDRONE-ETH ESTRADIOL 1-35 MG-MCG PO TABS: 1.0000 | ORAL_TABLET | Freq: Every day | ORAL | Status: DC

## 2012-06-21 NOTE — Telephone Encounter (Signed)
orth novum sent in, let mother klnow she needs to start the tablets on day 1 of her next cycle the day she starts her period, then take daily the entire pack, then got to the next pack after she finishes the 28 tabs. Tell her Mckena will bleed for a few days on the birth control, but more controlled and on a schedule

## 2012-06-21 NOTE — Telephone Encounter (Signed)
States she will try her on the regular pills

## 2012-06-21 NOTE — Telephone Encounter (Signed)
Wants the regular BC pills

## 2012-06-22 NOTE — Telephone Encounter (Signed)
Mother aware

## 2012-07-01 ENCOUNTER — Encounter: Payer: Self-pay | Admitting: Family Medicine

## 2012-07-01 NOTE — Assessment & Plan Note (Signed)
Low HDL, needs to increase physical activity,LDL at goal, no med change

## 2012-07-01 NOTE — Assessment & Plan Note (Signed)
Controlled, no change in medication  

## 2012-07-01 NOTE — Assessment & Plan Note (Signed)
New area of ulceration and skin excoriation on back. No purulent drainage from area.  Will cover with antibiotics empirically as pt tends to scratch and is unable to follow through on instructions Also advised of the need to keep area clean and dry and not traumatize skin as this will delay /prevent healing

## 2012-07-01 NOTE — Assessment & Plan Note (Signed)
Pt to start sesaonale as Mother was unable to follow through on use of OCP for 21 day sessions, she had irregular bleeding,a nd the packs brought in show erratic use

## 2012-08-14 DIAGNOSIS — G10 Huntington's disease: Secondary | ICD-10-CM | POA: Diagnosis not present

## 2012-08-14 DIAGNOSIS — F0281 Dementia in other diseases classified elsewhere with behavioral disturbance: Secondary | ICD-10-CM | POA: Diagnosis not present

## 2012-08-22 ENCOUNTER — Other Ambulatory Visit: Payer: Self-pay | Admitting: Family Medicine

## 2012-08-30 ENCOUNTER — Encounter: Payer: Medicare Other | Admitting: Family Medicine

## 2012-09-10 ENCOUNTER — Ambulatory Visit: Payer: Medicare Other | Admitting: Family Medicine

## 2012-09-21 ENCOUNTER — Other Ambulatory Visit: Payer: Self-pay | Admitting: Family Medicine

## 2012-09-25 ENCOUNTER — Other Ambulatory Visit: Payer: Self-pay | Admitting: Family Medicine

## 2012-09-25 DIAGNOSIS — E119 Type 2 diabetes mellitus without complications: Secondary | ICD-10-CM | POA: Diagnosis not present

## 2012-09-25 DIAGNOSIS — E669 Obesity, unspecified: Secondary | ICD-10-CM | POA: Diagnosis not present

## 2012-09-26 ENCOUNTER — Telehealth: Payer: Self-pay | Admitting: Family Medicine

## 2012-09-26 ENCOUNTER — Encounter: Payer: Self-pay | Admitting: Family Medicine

## 2012-09-26 ENCOUNTER — Ambulatory Visit (INDEPENDENT_AMBULATORY_CARE_PROVIDER_SITE_OTHER): Payer: Medicare Other | Admitting: Family Medicine

## 2012-09-26 VITALS — BP 110/80 | HR 86 | Resp 16 | Ht 63.0 in | Wt 167.4 lb

## 2012-09-26 DIAGNOSIS — R5381 Other malaise: Secondary | ICD-10-CM

## 2012-09-26 DIAGNOSIS — E669 Obesity, unspecified: Secondary | ICD-10-CM

## 2012-09-26 DIAGNOSIS — Z0000 Encounter for general adult medical examination without abnormal findings: Secondary | ICD-10-CM

## 2012-09-26 DIAGNOSIS — Z1211 Encounter for screening for malignant neoplasm of colon: Secondary | ICD-10-CM

## 2012-09-26 DIAGNOSIS — E785 Hyperlipidemia, unspecified: Secondary | ICD-10-CM | POA: Diagnosis not present

## 2012-09-26 DIAGNOSIS — R5383 Other fatigue: Secondary | ICD-10-CM

## 2012-09-26 DIAGNOSIS — J301 Allergic rhinitis due to pollen: Secondary | ICD-10-CM

## 2012-09-26 DIAGNOSIS — G10 Huntington's disease: Secondary | ICD-10-CM

## 2012-09-26 DIAGNOSIS — I10 Essential (primary) hypertension: Secondary | ICD-10-CM | POA: Diagnosis not present

## 2012-09-26 DIAGNOSIS — E119 Type 2 diabetes mellitus without complications: Secondary | ICD-10-CM | POA: Diagnosis not present

## 2012-09-26 LAB — BASIC METABOLIC PANEL
BUN: 12 mg/dL (ref 6–23)
CO2: 29 mEq/L (ref 19–32)
Chloride: 104 mEq/L (ref 96–112)
Glucose, Bld: 82 mg/dL (ref 70–99)
Potassium: 4.3 mEq/L (ref 3.5–5.3)

## 2012-09-26 LAB — HEMOCCULT GUIAC POC 1CARD (OFFICE)

## 2012-09-26 LAB — HEMOGLOBIN A1C: Hgb A1c MFr Bld: 5.7 % — ABNORMAL HIGH (ref <5.7)

## 2012-09-26 NOTE — Assessment & Plan Note (Signed)
Improved Patient advised to reduce carb and sweets, commit to regular physical activity, take meds as prescribed, test blood as directed, and attempt to lose weight, to improve blood sugar control.  

## 2012-09-26 NOTE — Assessment & Plan Note (Signed)
Controlled, no change in medication  

## 2012-09-26 NOTE — Assessment & Plan Note (Signed)
Improved. Pt applauded on succesful weight loss through lifestyle change, and encouraged to continue same. Weight loss goal set for the next several months.  

## 2012-09-26 NOTE — Assessment & Plan Note (Signed)
Controlled, no change in medication DASH diet and commitment to daily physical activity for a minimum of 30 minutes discussed and encouraged, as a part of hypertension management. The importance of attaining a healthy weight is also discussed.  

## 2012-09-26 NOTE — Progress Notes (Signed)
  Subjective:    Patient ID: Victoria [name], female    DOB: Oct 22, 1970, 42 y.o.   MRN: [id number]  HPI Pt in for rectal  and breast exam , she has no concerns, is here to f/u labs, and update recentr consults and have general health overseen Recently seen by neurologist and is on once daily namenda Has been dancing more, eating less, lost weight and is doing generally well. She however , is notably more reserved, and less communicative, seemingly , her neurologic disease is progressing   Review of Systems See HPI Denies recent fever or chills. Denies sinus pressure, nasal congestion, ear pain or sore throat. Denies chest congestion, productive cough or wheezing. Denies chest pains, palpitations and leg swelling Denies abdominal pain, nausea, vomiting,diarrhea or constipation.   Denies dysuria, frequency, hesitancy or incontinence. Denies joint pain, swelling and limitation in mobility. Denies headaches, seizures, numbness, or tingling.       Objective:   Physical Exam  Pleasant well nourished female, alert and oriented x 3, in no cardio-pulmonary distress. Afebrile. HEENT No facial trauma or asymetry. Sinuses non tender.  EOMI, PERTL,   External ears normal, tympanic membranes clear. Oropharynx moist, no exudate, fair dentition. Neck: supple, no adenopathy,JVD or thyromegaly.No bruits.  Chest: Clear to ascultation bilaterally.No crackles or wheezes. Non tender to palpation  Breast: No asymetry,no masses. No nipple discharge or inversion. No axillary or supraclavicular adenopathy  Cardiovascular system; Heart sounds normal,  S1 and  S2 ,no S3.  No murmur, or thrill. Apical beat not displaced Peripheral pulses normal.  Abdomen: Soft, non tender, no organomegaly or masses. No bruits. Bowel sounds normal. No guarding, tenderness or rebound.  Rectal:  No mass. Guaiac negative stool.    Musculoskeletal exam: Full ROM of spine, hips , shoulders and knees. No  deformity ,swelling or crepitus noted. No muscle wasting or atrophy.   Neurologic: Cranial nerves 2 to 12 intact. Power, tone ,sensation and reflexes normal throughout. No disturbance in gait. No tremor.  Skin: Intact, no ulceration, erythema , scaling or rash noted. Pigmentation normal throughout  Psych; Normal mood and affect. Judgement and concentration normal       Assessment & Plan:

## 2012-09-26 NOTE — Assessment & Plan Note (Signed)
Breast and rectal and general exam done at visit as documented. Labs and blood pressure and cancer screening reviewedand updated. Pt congratulated on weight loss and regular dancing with improved blood sugars She is referred to opthalmologist

## 2012-09-26 NOTE — Assessment & Plan Note (Signed)
Pt to continue to follow with neurlogist at [facility]

## 2012-09-26 NOTE — Patient Instructions (Addendum)
F/u end September. Please call if you need me before.  Congrats on weight loss and improved blood sugar, keep dancing and keep this up!   Fasting CBc, lipid, cmp and EGFr, hBA1C and microalb in September before visit.  You are referred for an eye exam  Please do not scratch your back, use neosporin twice daily, small amount till better

## 2012-09-26 NOTE — Telephone Encounter (Signed)
Appointment at [facility] 4.15.14 at 3:00 patient is aware was told before leaving the office

## 2012-10-02 DIAGNOSIS — H52229 Regular astigmatism, unspecified eye: Secondary | ICD-10-CM | POA: Diagnosis not present

## 2012-10-02 DIAGNOSIS — H5200 Hypermetropia, unspecified eye: Secondary | ICD-10-CM | POA: Diagnosis not present

## 2012-10-02 DIAGNOSIS — E119 Type 2 diabetes mellitus without complications: Secondary | ICD-10-CM | POA: Diagnosis not present

## 2012-10-18 ENCOUNTER — Other Ambulatory Visit: Payer: Self-pay | Admitting: Family Medicine

## 2012-12-27 ENCOUNTER — Other Ambulatory Visit: Payer: Self-pay

## 2013-01-17 ENCOUNTER — Other Ambulatory Visit: Payer: Self-pay | Admitting: Family Medicine

## 2013-01-28 ENCOUNTER — Other Ambulatory Visit: Payer: Self-pay | Admitting: Family Medicine

## 2013-03-14 ENCOUNTER — Ambulatory Visit: Payer: Medicare Other | Admitting: Family Medicine

## 2013-03-14 DIAGNOSIS — F0281 Dementia in other diseases classified elsewhere with behavioral disturbance: Secondary | ICD-10-CM | POA: Diagnosis not present

## 2013-03-15 ENCOUNTER — Other Ambulatory Visit: Payer: Self-pay | Admitting: Family Medicine

## 2013-03-15 DIAGNOSIS — G10 Huntington's disease: Secondary | ICD-10-CM | POA: Diagnosis not present

## 2013-03-15 DIAGNOSIS — E785 Hyperlipidemia, unspecified: Secondary | ICD-10-CM | POA: Diagnosis not present

## 2013-03-15 DIAGNOSIS — E119 Type 2 diabetes mellitus without complications: Secondary | ICD-10-CM | POA: Diagnosis not present

## 2013-03-15 DIAGNOSIS — R5381 Other malaise: Secondary | ICD-10-CM | POA: Diagnosis not present

## 2013-03-15 LAB — CBC WITH DIFFERENTIAL/PLATELET
Basophils Absolute: 0 10*3/uL (ref 0.0–0.1)
HCT: 37.8 % (ref 36.0–46.0)
Hemoglobin: 12.4 g/dL (ref 12.0–15.0)
Lymphocytes Relative: 52 % — ABNORMAL HIGH (ref 12–46)
Monocytes Absolute: 0.4 10*3/uL (ref 0.1–1.0)
Monocytes Relative: 6 % (ref 3–12)
Neutro Abs: 2.5 10*3/uL (ref 1.7–7.7)
Neutrophils Relative %: 39 % — ABNORMAL LOW (ref 43–77)
WBC: 6.2 10*3/uL (ref 4.0–10.5)

## 2013-03-16 ENCOUNTER — Other Ambulatory Visit: Payer: Self-pay | Admitting: Family Medicine

## 2013-03-16 LAB — COMPLETE METABOLIC PANEL WITH GFR
ALT: 9 U/L (ref 0–35)
AST: 13 U/L (ref 0–37)
Alkaline Phosphatase: 56 U/L (ref 39–117)
Creat: 0.98 mg/dL (ref 0.50–1.10)
Sodium: 138 mEq/L (ref 135–145)
Total Bilirubin: 0.4 mg/dL (ref 0.3–1.2)
Total Protein: 7.7 g/dL (ref 6.0–8.3)

## 2013-03-16 LAB — LIPID PANEL
Cholesterol: 152 mg/dL (ref 0–200)
HDL: 44 mg/dL (ref >39)
LDL Cholesterol: 84 mg/dL (ref 0–99)
Triglycerides: 119 mg/dL (ref <150)

## 2013-03-16 LAB — MICROALBUMIN / CREATININE URINE RATIO
Creatinine, Urine: 168.6 mg/dL
Microalb Creat Ratio: 3 mg/g (ref 0.0–30.0)

## 2013-03-19 ENCOUNTER — Encounter: Payer: Self-pay | Admitting: Family Medicine

## 2013-03-19 ENCOUNTER — Other Ambulatory Visit: Payer: Self-pay | Admitting: Family Medicine

## 2013-03-19 ENCOUNTER — Ambulatory Visit (INDEPENDENT_AMBULATORY_CARE_PROVIDER_SITE_OTHER): Payer: Medicare Other | Admitting: Family Medicine

## 2013-03-19 VITALS — BP 112/80 | HR 89 | Resp 16 | Wt 157.8 lb

## 2013-03-19 DIAGNOSIS — Z23 Encounter for immunization: Secondary | ICD-10-CM | POA: Diagnosis not present

## 2013-03-19 DIAGNOSIS — Z1231 Encounter for screening mammogram for malignant neoplasm of breast: Secondary | ICD-10-CM | POA: Diagnosis not present

## 2013-03-19 DIAGNOSIS — R922 Inconclusive mammogram: Secondary | ICD-10-CM

## 2013-03-19 DIAGNOSIS — E119 Type 2 diabetes mellitus without complications: Secondary | ICD-10-CM

## 2013-03-19 DIAGNOSIS — E663 Overweight: Secondary | ICD-10-CM

## 2013-03-19 DIAGNOSIS — R928 Other abnormal and inconclusive findings on diagnostic imaging of breast: Secondary | ICD-10-CM

## 2013-03-19 DIAGNOSIS — G10 Huntington's disease: Secondary | ICD-10-CM

## 2013-03-19 DIAGNOSIS — E785 Hyperlipidemia, unspecified: Secondary | ICD-10-CM

## 2013-03-19 DIAGNOSIS — Z1239 Encounter for other screening for malignant neoplasm of breast: Secondary | ICD-10-CM | POA: Diagnosis not present

## 2013-03-19 DIAGNOSIS — Z6825 Body mass index (BMI) 25.0-25.9, adult: Secondary | ICD-10-CM

## 2013-03-19 MED ORDER — BENAZEPRIL HCL 5 MG PO TABS: 5.0000 mg | ORAL_TABLET | Freq: Every day | ORAL | Status: DC

## 2013-03-19 MED ORDER — CETIRIZINE HCL 10 MG PO TABS: ORAL_TABLET | ORAL | Status: DC

## 2013-03-19 MED ORDER — METFORMIN HCL 500 MG PO TABS: ORAL_TABLET | ORAL | Status: DC

## 2013-03-19 MED ORDER — CLOBETASOL PROPIONATE 0.05 % EX SHAM: MEDICATED_SHAMPOO | CUTANEOUS | Status: AC

## 2013-03-19 MED ORDER — LOVASTATIN 20 MG PO TABS: ORAL_TABLET | ORAL | Status: DC

## 2013-03-19 NOTE — Assessment & Plan Note (Signed)
Improved. Pt applauded on succesful weight loss through lifestyle change, and encouraged to continue same. Weight loss goal set for the next several months.  

## 2013-03-19 NOTE — Assessment & Plan Note (Signed)
Followed at [facility] by neurology, slowly progressing

## 2013-03-19 NOTE — Patient Instructions (Addendum)
F/U in 4 month  Flu vaccine today  No changes in medication.  Call if you decide you want to go to gyne for help to stop the cycles   Labs are excellent,   Stop at front desk FOR APPT FOR  mammogram as well eye exam with Dr Morton Peters will be referred for community assistance program AS DISCUSSED YOU WILL HEAR MORE ABOUT THIS FROM THE NURSING STAFF  hba1c AND CHEM 7 AND egfr IN 4 MONTH, NON FASTING

## 2013-03-19 NOTE — Progress Notes (Signed)
  Subjective:    Patient ID: Victoria [name], female    DOB: Aug 06, 1970, 42 y.o.   MRN: [id number]  HPI    Review of Systems     Objective:   Physical Exam        Assessment & Plan:  After visit, decision made to add ACE inhibitor for renal protection, as she is diabetic. Contact to be made with her mother explaining thsi, medication is sent in today

## 2013-03-19 NOTE — Assessment & Plan Note (Signed)
Controlled, no change in medication Hyperlipidemia:Low fat diet discussed and encouraged.  \ 

## 2013-03-19 NOTE — Assessment & Plan Note (Signed)
Controlled, no change in medication Patient advised to reduce carb and sweets, commit to regular physical activity, take meds as prescribed, test blood as directed, and attempt to lose weight, to improve blood sugar control. Add lotensin for renal protection

## 2013-03-19 NOTE — Progress Notes (Signed)
  Subjective:    Patient ID: Victoria [name], female    DOB: 1971-03-13, 42 y.o.   MRN: [id number]  HPI The PT is here for follow up and re-evaluation of chronic medical conditions, medication management and review of any available recent lab and radiology data.  Preventive health is updated, specifically  Cancer screening and Immunization.   Questions or concerns regarding consultations or procedures which the PT has had in the interim are  Addressed.Recently had remeron added to her regime by her neurologist who she saw last month The PT denies any adverse reactions to current medications since the last visit.  Mom has 2 questions, one is about help for her to get out and do community activities, and maybe get some help at home with her as Lowen is increasingly unable to care for herself including ADL's due to her progressive neurologic disease. The other is the option of contraception to stop her menses, asked about depo and I offered a gyne eval put she declines at this time, when I told her that some people actually bleed very heavily with depo initially , she will consider this and also the option of trying the pill again    Review of Systems See HPI Denies recent fever or chills. Denies sinus pressure, nasal congestion, ear pain or sore throat. Denies chest congestion, productive cough or wheezing. Denies chest pains, palpitations and leg swelling Denies abdominal pain, nausea, vomiting,diarrhea or constipation.   Denies dysuria, frequency, hesitancy or incontinence. Denies joint pain, swelling and limitation in mobility. Denies headaches, seizures, numbness, or tingling. . C/o dark splotches on upper aspects of both feet for months, no change in them, wonders if there is a topical agent to get rid of them. Pt denies itching or drainage from the lesions, option is taken to apply no medication at this time .        Objective:   Physical Exam  Patient alert  and in no  cardiopulmonary distress.Limited communication ability due to neurologic disease, mother has to provide history and is the decision maker in her health care  HEENT: No facial asymmetry, EOMI, no sinus tenderness,  oropharynx pink and moist.  Neck supple no adenopathy.  Chest: Clear to auscultation bilaterally.  CVS: S1, S2 no murmurs, no S3.  ABD: Soft non tender. Bowel sounds normal.  Ext: No edema  MS: Adequate ROM spine, shoulders, hips and knees.  Skin: Intact, no ulcerations , hyperpigmented macular lesions approx 3 on each foot noted, max dia approx 1.5 cm each  Psych: Good eye contact, blunted  affect. Memory impaired anxious or depressed appearing.  CNS: CN 2-12 intact, power, tone and sensation normal throughout.       Assessment & Plan:

## 2013-03-26 ENCOUNTER — Telehealth: Payer: Self-pay | Admitting: Family Medicine

## 2013-03-29 NOTE — Telephone Encounter (Signed)
Called and spoke with Stanton Kidney at the [facility] for [facility] and clarified the reason that the family was requesting services.

## 2013-04-04 ENCOUNTER — Telehealth: Payer: Self-pay | Admitting: Family Medicine

## 2013-04-09 NOTE — Telephone Encounter (Signed)
Spoke with Judeth Cornfield with the [facility] for [employer].  Form to be submitted to [facility]

## 2013-04-12 ENCOUNTER — Other Ambulatory Visit: Payer: Self-pay | Admitting: Family Medicine

## 2013-05-22 ENCOUNTER — Ambulatory Visit (HOSPITAL_COMMUNITY)
Admission: RE | Admit: 2013-05-22 | Discharge: 2013-05-22 | Disposition: A | Discharge status: Home / Self Care | Payer: Medicare Other | Source: Ambulatory Visit | Attending: Family Medicine | Admitting: Family Medicine

## 2013-05-22 DIAGNOSIS — Z09 Encounter for follow-up examination after completed treatment for conditions other than malignant neoplasm: Secondary | ICD-10-CM | POA: Diagnosis not present

## 2013-05-22 DIAGNOSIS — R928 Other abnormal and inconclusive findings on diagnostic imaging of breast: Secondary | ICD-10-CM | POA: Diagnosis not present

## 2013-05-22 DIAGNOSIS — R922 Inconclusive mammogram: Secondary | ICD-10-CM

## 2013-06-27 DIAGNOSIS — G10 Huntington's disease: Secondary | ICD-10-CM | POA: Diagnosis not present

## 2013-06-27 DIAGNOSIS — F0281 Dementia in other diseases classified elsewhere with behavioral disturbance: Secondary | ICD-10-CM | POA: Diagnosis not present

## 2013-06-27 DIAGNOSIS — F02818 Dementia in other diseases classified elsewhere, unspecified severity, with other behavioral disturbance: Secondary | ICD-10-CM | POA: Diagnosis not present

## 2013-07-12 DIAGNOSIS — E119 Type 2 diabetes mellitus without complications: Secondary | ICD-10-CM | POA: Diagnosis not present

## 2013-07-12 LAB — COMPLETE METABOLIC PANEL WITH GFR
ALT: 9 U/L (ref 0–35)
AST: 12 U/L (ref 0–37)
Albumin: 4.2 g/dL (ref 3.5–5.2)
Alkaline Phosphatase: 48 U/L (ref 39–117)
BUN: 12 mg/dL (ref 6–23)
CO2: 29 mEq/L (ref 19–32)
Calcium: 9.3 mg/dL (ref 8.4–10.5)
Chloride: 103 mEq/L (ref 96–112)
Creat: 1.04 mg/dL (ref 0.50–1.10)
GFR, Est African American: 77 mL/min
GFR, Est Non African American: 66 mL/min
Glucose, Bld: 84 mg/dL (ref 70–99)
Potassium: 4.1 mEq/L (ref 3.5–5.3)
Sodium: 138 mEq/L (ref 135–145)
Total Bilirubin: 0.6 mg/dL (ref 0.3–1.2)
Total Protein: 7.2 g/dL (ref 6.0–8.3)

## 2013-07-12 LAB — HEMOGLOBIN A1C
Hgb A1c MFr Bld: 5.9 % — ABNORMAL HIGH (ref <5.7)
Mean Plasma Glucose: 123 mg/dL — ABNORMAL HIGH (ref <117)

## 2013-07-16 ENCOUNTER — Encounter: Payer: Self-pay | Admitting: Family Medicine

## 2013-07-16 ENCOUNTER — Encounter (INDEPENDENT_AMBULATORY_CARE_PROVIDER_SITE_OTHER): Payer: Self-pay

## 2013-07-16 ENCOUNTER — Ambulatory Visit (INDEPENDENT_AMBULATORY_CARE_PROVIDER_SITE_OTHER): Payer: Medicare Other | Admitting: Family Medicine

## 2013-07-16 VITALS — BP 100/60 | HR 88 | Resp 18 | Ht 63.0 in | Wt 160.1 lb

## 2013-07-16 DIAGNOSIS — L219 Seborrheic dermatitis, unspecified: Secondary | ICD-10-CM

## 2013-07-16 DIAGNOSIS — E119 Type 2 diabetes mellitus without complications: Secondary | ICD-10-CM

## 2013-07-16 DIAGNOSIS — E663 Overweight: Secondary | ICD-10-CM | POA: Diagnosis not present

## 2013-07-16 DIAGNOSIS — G10 Huntington's disease: Secondary | ICD-10-CM

## 2013-07-16 DIAGNOSIS — E785 Hyperlipidemia, unspecified: Secondary | ICD-10-CM

## 2013-07-16 DIAGNOSIS — L218 Other seborrheic dermatitis: Secondary | ICD-10-CM

## 2013-07-16 MED ORDER — METFORMIN HCL 500 MG PO TABS: 500.0000 mg | ORAL_TABLET | Freq: Every day | ORAL | Status: DC

## 2013-07-16 MED ORDER — RAMIPRIL 2.5 MG PO CAPS: 2.5000 mg | ORAL_CAPSULE | Freq: Every day | ORAL | Status: DC

## 2013-07-16 NOTE — Patient Instructions (Signed)
Pelvic and breast in 4.5 month, call if you need me before  Reduce metformin to one daily  New for kidney protection is ramipril 2.5 mg one daily  Fasting lipid, cmp and eGFr and hBa1c in 4.5 month, before next visit

## 2013-07-17 ENCOUNTER — Other Ambulatory Visit: Payer: Self-pay

## 2013-07-17 DIAGNOSIS — E119 Type 2 diabetes mellitus without complications: Secondary | ICD-10-CM

## 2013-07-17 MED ORDER — RAMIPRIL 2.5 MG PO CAPS: 2.5000 mg | ORAL_CAPSULE | Freq: Every day | ORAL | Status: DC

## 2013-08-10 DIAGNOSIS — L219 Seborrheic dermatitis, unspecified: Secondary | ICD-10-CM | POA: Insufficient documentation

## 2013-08-10 NOTE — Assessment & Plan Note (Signed)
Deteriorated. Patient re-educated about  the importance of commitment to a  minimum of 150 minutes of exercise per week. The importance of healthy food choices with portion control discussed. Encouraged to start a food diary, count calories and to consider  joining a support group. Sample diet sheets offered. Goals set by the patient for the next several months.    

## 2013-08-10 NOTE — Assessment & Plan Note (Signed)
Controlled, no change in medication Patient advised to reduce carb and sweets, commit to regular physical activity, take meds as prescribed, test blood as directed, and attempt to lose weight, to improve blood sugar control.  

## 2013-08-10 NOTE — Assessment & Plan Note (Signed)
Clinically appears to be deteriorating Managed by neurology at [facility] and is followed closely

## 2013-08-10 NOTE — Assessment & Plan Note (Signed)
Controlled with regular use of medicated shampoo

## 2013-08-10 NOTE — Progress Notes (Signed)
   Subjective:    Patient ID: Victoria [name], female    DOB: 01-24-1971, 43 y.o.   MRN: [id number]  HPI The PT is here for follow up and re-evaluation of chronic medical conditions, medication management and review of any available recent lab and radiology data.  Preventive health is updated, specifically  Cancer screening and Immunization.   Is seeing neurology more often and she is clearly deteriorating as far as her disease is concerned The PT denies any adverse reactions to current medications since the last visit.  There are no new concerns voiced by her mother who is her caregiver  There are no specific complaints       Review of Systems See HPI, history is verified with Mother as pt is increasingly mentally challenged because of her illness Denies recent fever or chills. Denies sinus pressure, nasal congestion, ear pain or sore throat. Denies chest congestion, productive cough or wheezing. Denies chest pains, palpitations and leg swelling Denies abdominal pain, nausea, vomiting,diarrhea or constipation.   Denies dysuria, frequency, hesitancy or incontinence. Unsteady balance , and gait , however denies falls. Denies headaches, seizures, numbness, or tingling. Denies depression, anxiety or insomnia.prescribed medication for this  By neurology Denies skin break down or rash.        Objective:   Physical Exam BP 100/60  Pulse 88  Resp 18  Ht 5\' 3"  (1.6 m)  Wt 160 lb 1.9 oz (72.63 kg)  BMI 28.37 kg/m2  SpO2 93% Patient alert and oriented and in no cardiopulmonary distress.  HEENT: No facial asymmetry, EOMI, no sinus tenderness,  oropharynx pink and moist.  Neck supple no adenopathy.  Chest: Clear to auscultation bilaterally.  CVS: S1, S2 no murmurs, no S3.  ABD: Soft non tender. Bowel sounds normal.  Ext: No edema  MS: Adequate ROM spine, shoulders, hips and knees.  Skin: Intact, no ulcerations or rash noted.  Psych: Good eye contact, blunted  affect.  Memory impaired, mild MR, not anxious or depressed appearing.  CNS: CN 2-12 intact, power, tone and sensation normal throughout.        Assessment & Plan:  DIABETES MELLITUS Controlled, no change in medication Patient advised to reduce carb and sweets, commit to regular physical activity, take meds as prescribed, test blood as directed, and attempt to lose weight, to improve blood sugar control.   HUNTINGTON'S DISEASE Clinically appears to be deteriorating Managed by neurology at [facility] and is followed closely  Overweight (BMI 25.0-29.9) Deteriorated. Patient re-educated about  the importance of commitment to a  minimum of 150 minutes of exercise per week. The importance of healthy food choices with portion control discussed. Encouraged to start a food diary, count calories and to consider  joining a support group. Sample diet sheets offered. Goals set by the patient for the next several months.     Hyperlipidemia with target LDL less than 100 Controlled, no change in medication Hyperlipidemia:Low fat diet discussed and encouraged.  Updated lab needed at/ before next visit.   Seborrheic dermatitis of scalp Controlled with regular use of medicated shampoo

## 2013-08-10 NOTE — Assessment & Plan Note (Signed)
Controlled, no change in medication Hyperlipidemia:Low fat diet discussed and encouraged.  Updated lab needed at/ before next visit.  

## 2013-09-04 ENCOUNTER — Other Ambulatory Visit: Payer: Self-pay | Admitting: Family Medicine

## 2013-09-10 DIAGNOSIS — G10 Huntington's disease: Secondary | ICD-10-CM | POA: Diagnosis not present

## 2013-09-10 DIAGNOSIS — F02818 Dementia in other diseases classified elsewhere, unspecified severity, with other behavioral disturbance: Secondary | ICD-10-CM | POA: Diagnosis not present

## 2013-09-10 DIAGNOSIS — F0281 Dementia in other diseases classified elsewhere with behavioral disturbance: Secondary | ICD-10-CM | POA: Diagnosis not present

## 2013-10-31 DIAGNOSIS — F02818 Dementia in other diseases classified elsewhere, unspecified severity, with other behavioral disturbance: Secondary | ICD-10-CM | POA: Diagnosis not present

## 2013-10-31 DIAGNOSIS — F0281 Dementia in other diseases classified elsewhere with behavioral disturbance: Secondary | ICD-10-CM | POA: Diagnosis not present

## 2013-10-31 DIAGNOSIS — G10 Huntington's disease: Secondary | ICD-10-CM | POA: Diagnosis not present

## 2013-11-21 ENCOUNTER — Ambulatory Visit (INDEPENDENT_AMBULATORY_CARE_PROVIDER_SITE_OTHER): Payer: Medicare Other | Admitting: Family Medicine

## 2013-11-21 ENCOUNTER — Encounter (INDEPENDENT_AMBULATORY_CARE_PROVIDER_SITE_OTHER): Payer: Self-pay

## 2013-11-21 ENCOUNTER — Encounter: Payer: Self-pay | Admitting: Family Medicine

## 2013-11-21 VITALS — BP 106/64 | HR 66 | Resp 18 | Ht 63.0 in | Wt 163.0 lb

## 2013-11-21 DIAGNOSIS — E119 Type 2 diabetes mellitus without complications: Secondary | ICD-10-CM

## 2013-11-21 DIAGNOSIS — E785 Hyperlipidemia, unspecified: Secondary | ICD-10-CM | POA: Diagnosis not present

## 2013-11-21 DIAGNOSIS — R920 Mammographic microcalcification found on diagnostic imaging of breast: Secondary | ICD-10-CM

## 2013-11-21 DIAGNOSIS — N926 Irregular menstruation, unspecified: Secondary | ICD-10-CM

## 2013-11-21 DIAGNOSIS — G10 Huntington's disease: Secondary | ICD-10-CM | POA: Diagnosis not present

## 2013-11-21 DIAGNOSIS — N920 Excessive and frequent menstruation with regular cycle: Secondary | ICD-10-CM | POA: Diagnosis not present

## 2013-11-21 DIAGNOSIS — N939 Abnormal uterine and vaginal bleeding, unspecified: Secondary | ICD-10-CM

## 2013-11-21 LAB — COMPLETE METABOLIC PANEL WITH GFR
ALBUMIN: 3.8 g/dL (ref 3.5–5.2)
ALK PHOS: 39 U/L (ref 39–117)
ALT: <8 U/L (ref 0–35)
AST: 11 U/L (ref 0–37)
BUN: 11 mg/dL (ref 6–23)
CALCIUM: 9.2 mg/dL (ref 8.4–10.5)
CHLORIDE: 104 meq/L (ref 96–112)
CO2: 28 mEq/L (ref 19–32)
Creat: 0.93 mg/dL (ref 0.50–1.10)
GFR, Est African American: 88 mL/min
GFR, Est Non African American: 76 mL/min
Glucose, Bld: 79 mg/dL (ref 70–99)
POTASSIUM: 4.3 meq/L (ref 3.5–5.3)
SODIUM: 138 meq/L (ref 135–145)
TOTAL PROTEIN: 6.6 g/dL (ref 6.0–8.3)
Total Bilirubin: 0.4 mg/dL (ref 0.2–1.2)

## 2013-11-21 LAB — LIPID PANEL
Cholesterol: 141 mg/dL (ref 0–200)
HDL: 36 mg/dL — AB (ref >39)
LDL Cholesterol: 90 mg/dL (ref 0–99)
TRIGLYCERIDES: 74 mg/dL (ref <150)
Total CHOL/HDL Ratio: 3.9 Ratio
VLDL: 15 mg/dL (ref 0–40)

## 2013-11-21 NOTE — Patient Instructions (Signed)
Annual wellness in 4 month, call if you need me before  You are referred for eye exam and also to podiatry  Your labs will be faxed to your Provider at [facility] are referred to [facility] for help with heavy menses to be started on medcation by injection hopfully  Foot exam today is good

## 2013-11-22 LAB — HEMOGLOBIN A1C
Hgb A1c MFr Bld: 6 % — ABNORMAL HIGH (ref <5.7)
Mean Plasma Glucose: 126 mg/dL — ABNORMAL HIGH (ref <117)

## 2013-11-23 NOTE — Assessment & Plan Note (Signed)
Needs bilateral diagnostic mammogram 12/04 or after will enter as future order

## 2013-11-23 NOTE — Assessment & Plan Note (Signed)
Controlled, no change in medication Hyperlipidemia:Low fat diet discussed and encouraged.  \ 

## 2013-11-23 NOTE — Assessment & Plan Note (Signed)
obvious deterioration in neurologic health, expressive aphasia is worsening, relies increasingly  On Mm to answer questions asked, and gait is more unsteady. She does have an aide daily to help with her personal care, Mother states no additional help currently needed.  Followed by neurology at [facility]

## 2013-11-23 NOTE — Progress Notes (Signed)
   Subjective:    Patient ID: Victoria [name], female    DOB: [birth year], 42 y.o.   MRN: [id number]  HPI The PT is here for follow up and re-evaluation of chronic medical conditions, medication management and review of any available recent lab and radiology data.  Preventive health is updated, specifically  Cancer screening and Immunization.   . The PT denies any adverse reactions to current medications since the last visit. Has had new medication for sleep which is effective Ongoing heavy menses, requests help wit this , will need to see gyne for depo  Now has help with her ADLs 5 days per week which is very beneficial, gaait more unsteady and speech less effective      Review of Systems  ttory as pt is incapable due to chronic progressive neurologic illness Denies recent fever or chills. Denies sinus pressure, nasal congestion, ear pain or sore throat. Denies chest congestion, productive cough or wheezing. Denies chest pains, palpitations and leg swelling Denies abdominal pain, nausea, vomiting,diarrhea or constipation.   Denies dysuria, frequency, hesitancy or incontinence.  Denies headaches, seizures, numbness, or tingling.  Denies skin break down or rash.     Objective:   Physical Exam BP 106/64  Pulse 66  Resp 18  Ht 5\' 3"  (1.6 m)  Wt 163 lb 0.6 oz (73.954 kg)  BMI 28.89 kg/m2  SpO2 99% Patient alert d and in no cardiopulmonary distress.  HEENT: No facial asymmetry, EOMI, no sinus tenderness,  oropharynx pink and moist.  Neck supple no adenopathy.  Chest: Clear to auscultation bilaterally.  CVS: S1, S2 no murmurs, no S3.  ABD: Soft non tender.   Ext: No edema  MS: Adequate ROM spine, shoulders, hips and knees.  Skin: Intact,   Psych: Good eye contact, flat affect, not depressed or anxious, unable to asses memory due to neurologic illness  CNS: CN 2-12 intact, power, t normal throughout.        Assessment & Plan:  HUNTINGTON'S DISEASE obvious  deterioration in neurologic health, expressive aphasia is worsening, relies increasingly  On Mm to answer questions asked, and gait is more unsteady. She does have an aide daily to help with her personal care, Mother states no additional help currently needed.  Followed by neurology at [facility]  Hyperlipidemia with target LDL less than 100 Controlled, no change in medication Hyperlipidemia:Low fat diet discussed and encouraged.    DIABETES MELLITUS Controlled, no change in medication Patient advised to reduce carb and sweets, commit to regular physical activity, take meds as prescribed, test blood as directed, and attempt to lose weight, to improve blood sugar control.   Abnormal menses Continues to bleed heavily and incapable of her own personl care, refer to gyne for help with this, mother was unable to "keep up with pills  Abnormal mammogram with microcalcification Needs bilateral diagnostic mammogram 12/04 or after will enter as future order

## 2013-11-23 NOTE — Assessment & Plan Note (Signed)
Continues to bleed heavily and incapable of her own personl care, refer to gyne for help with this, mother was unable to "keep up with pills

## 2013-11-23 NOTE — Assessment & Plan Note (Signed)
Controlled, no change in medication Patient advised to reduce carb and sweets, commit to regular physical activity, take meds as prescribed, test blood as directed, and attempt to lose weight, to improve blood sugar control.  

## 2013-12-03 ENCOUNTER — Ambulatory Visit (INDEPENDENT_AMBULATORY_CARE_PROVIDER_SITE_OTHER): Payer: Medicare Other | Admitting: Advanced Practice Midwife

## 2013-12-03 ENCOUNTER — Encounter: Payer: Self-pay | Admitting: Advanced Practice Midwife

## 2013-12-03 VITALS — BP 110/80 | Ht 63.0 in | Wt 161.0 lb

## 2013-12-03 DIAGNOSIS — N920 Excessive and frequent menstruation with regular cycle: Secondary | ICD-10-CM | POA: Diagnosis not present

## 2013-12-03 LAB — POCT HEMOGLOBIN: Hemoglobin: 11 g/dL — AB (ref 12.2–16.2)

## 2013-12-03 MED ORDER — MEDROXYPROGESTERONE ACETATE 150 MG/ML IM SUSP: 150.0000 mg | INTRAMUSCULAR | Status: DC

## 2013-12-03 NOTE — Progress Notes (Signed)
   [facility] Visit  Patient name: Victoria [name] MRN [id number]  Date of birth: 1971-05-03  CC & HPI:  Victoria [name] is a 43 y.o. African American female presenting today for menstrual management.  She has Huntington's disease in a moderately advanced stage, and dealing with menses is getting difficult for her mother. Additionally, she has been having more bleeding over the past few months and heavier periods.  Referred by PCP for some hormonal treatment.  Has been on COC's before, but mother doesn't want to have to remember it every day.  Pertinent History Reviewed:  Medical & Surgical Hx:   Past Medical History  Diagnosis Date  . Hypertension   . Obesity   . Dyslipidemia   . Mild cognitive impairment   . Perennial allergic rhinitis   . Diabetes mellitus   . Anxiety   . Huntington's disease    History reviewed. No pertinent past surgical history. Medications: Reviewed & Updated - see associated section Social History: Reviewed -  reports that she has never smoked. She does not have any smokeless tobacco history on file.  Objective Findings:  Vitals: BP 110/80  Ht 5\' 3"  (1.6 m)  Wt 161 lb (73.029 kg)  BMI 28.53 kg/m2  LMP 10/16/2013  Physical Examination: General appearance - alert, well appearing, and in no distress Mental status - disoriented to time; pt somewhat aware of what's going on, but most interaction is with her mother  Results for orders placed in visit on 12/03/13 (from the past 24 hour(s))  POCT HEMOGLOBIN   Collection Time    12/03/13  2:31 PM      Result Value Ref Range   Hemoglobin 11.0 (*) 12.2 - 16.2 g/dL     Assessment & Plan:  A:   Metromenorrhagia P:  Options discussed:  Depo, Nexplanon, Ablation Unsure whether Medicare will cover Depo; if it does, will go with that. If not, plan Nexplanon at health department   [place], 12/03/2013 4:04 PM

## 2013-12-04 ENCOUNTER — Ambulatory Visit (INDEPENDENT_AMBULATORY_CARE_PROVIDER_SITE_OTHER): Payer: Medicare Other | Admitting: Advanced Practice Midwife

## 2013-12-04 DIAGNOSIS — Z3049 Encounter for surveillance of other contraceptives: Secondary | ICD-10-CM

## 2013-12-04 DIAGNOSIS — N926 Irregular menstruation, unspecified: Secondary | ICD-10-CM

## 2013-12-04 DIAGNOSIS — N939 Abnormal uterine and vaginal bleeding, unspecified: Principal | ICD-10-CM

## 2013-12-04 MED ORDER — MEDROXYPROGESTERONE ACETATE 150 MG/ML IM SUSP
150.0000 mg | Freq: Once | INTRAMUSCULAR | Status: AC
  Administered 2013-12-04: 150 mg via INTRAMUSCULAR

## 2013-12-04 NOTE — Progress Notes (Signed)
Patient ID: Victoria [name], female   DOB: May 19, 1971, 43 y.o.   MRN: [id number] Pt given Depo Provera 150 mg injection, left deltoid with no complications. Pt unable to obtain urine sample today for pregnancy test. Pt Mother states pt not sexually active. Pt has Huntington's Disease. Mylo Red, CNM gave verbal order to give Depo injection without a negative pregnancy test. Pt to return in 12 weeks for next injection.

## 2013-12-28 ENCOUNTER — Other Ambulatory Visit: Payer: Self-pay | Admitting: Family Medicine

## 2013-12-30 LAB — HM DIABETES EYE EXAM

## 2013-12-31 DIAGNOSIS — H5200 Hypermetropia, unspecified eye: Secondary | ICD-10-CM | POA: Diagnosis not present

## 2013-12-31 DIAGNOSIS — H52229 Regular astigmatism, unspecified eye: Secondary | ICD-10-CM | POA: Diagnosis not present

## 2013-12-31 DIAGNOSIS — E119 Type 2 diabetes mellitus without complications: Secondary | ICD-10-CM | POA: Diagnosis not present

## 2014-01-07 DIAGNOSIS — E119 Type 2 diabetes mellitus without complications: Secondary | ICD-10-CM | POA: Diagnosis not present

## 2014-01-07 DIAGNOSIS — B351 Tinea unguium: Secondary | ICD-10-CM | POA: Diagnosis not present

## 2014-01-11 NOTE — Progress Notes (Signed)
   Subjective:    Patient ID: Victoria [name], female    DOB: 06-22-1970, 43 y.o.   MRN: [id number]  HPI Pt not evaluated in office on this date   Review of Systems     Objective:   Physical Exam        Assessment & Plan:

## 2014-02-27 ENCOUNTER — Encounter: Payer: Self-pay | Admitting: Adult Health

## 2014-02-27 ENCOUNTER — Ambulatory Visit (INDEPENDENT_AMBULATORY_CARE_PROVIDER_SITE_OTHER): Payer: Medicare Other | Admitting: Adult Health

## 2014-02-27 DIAGNOSIS — Z3049 Encounter for surveillance of other contraceptives: Secondary | ICD-10-CM

## 2014-02-27 DIAGNOSIS — Z7689 Persons encountering health services in other specified circumstances: Secondary | ICD-10-CM

## 2014-02-27 MED ORDER — MEDROXYPROGESTERONE ACETATE 150 MG/ML IM SUSP
150.0000 mg | Freq: Once | INTRAMUSCULAR | Status: AC
  Administered 2014-02-27: 150 mg via INTRAMUSCULAR

## 2014-02-27 NOTE — Progress Notes (Signed)
Pt here for Depo shot. Unable to get a urine sample. [name] mom/caretaker was present with pt at visit today and states pt is on Depo for period management, not for contraception management. I spoke with Manus Gunning, CNM and she advised ok to do shot without pregnancy test. JSY

## 2014-02-28 ENCOUNTER — Other Ambulatory Visit: Payer: Self-pay | Admitting: Family Medicine

## 2014-03-06 DIAGNOSIS — G10 Huntington's disease: Secondary | ICD-10-CM | POA: Diagnosis not present

## 2014-03-06 DIAGNOSIS — F02818 Dementia in other diseases classified elsewhere, unspecified severity, with other behavioral disturbance: Secondary | ICD-10-CM | POA: Diagnosis not present

## 2014-03-06 DIAGNOSIS — F0281 Dementia in other diseases classified elsewhere with behavioral disturbance: Secondary | ICD-10-CM | POA: Diagnosis not present

## 2014-03-21 ENCOUNTER — Other Ambulatory Visit: Payer: Self-pay | Admitting: Family Medicine

## 2014-03-27 ENCOUNTER — Other Ambulatory Visit: Payer: Self-pay | Admitting: Family Medicine

## 2014-04-21 ENCOUNTER — Encounter: Payer: Self-pay | Admitting: Adult Health

## 2014-05-06 ENCOUNTER — Telehealth: Payer: Self-pay | Admitting: Family Medicine

## 2014-05-06 DIAGNOSIS — E119 Type 2 diabetes mellitus without complications: Secondary | ICD-10-CM

## 2014-05-06 DIAGNOSIS — E785 Hyperlipidemia, unspecified: Secondary | ICD-10-CM | POA: Diagnosis not present

## 2014-05-06 DIAGNOSIS — G10 Huntington's disease: Secondary | ICD-10-CM | POA: Diagnosis not present

## 2014-05-06 NOTE — Telephone Encounter (Signed)
Noted that patient may need a1c, tsh, cmp w/egfr, microalb, and cbc.  Do you agree?

## 2014-05-06 NOTE — Telephone Encounter (Signed)
Labs ordered and mother aware.

## 2014-05-06 NOTE — Addendum Note (Signed)
Addended by: Denman George B on: 05/06/2014 04:51 PM   Modules accepted: Orders

## 2014-05-06 NOTE — Telephone Encounter (Addendum)
Needs CBc, microalb , fasting lipid, cmp and EGFr, hBA1C and TSH pls

## 2014-05-07 LAB — COMPLETE METABOLIC PANEL WITH GFR
ALBUMIN: 4.1 g/dL (ref 3.5–5.2)
ALT: 9 U/L (ref 0–35)
AST: 10 U/L (ref 0–37)
Alkaline Phosphatase: 46 U/L (ref 39–117)
BUN: 13 mg/dL (ref 6–23)
CALCIUM: 9.4 mg/dL (ref 8.4–10.5)
CHLORIDE: 104 meq/L (ref 96–112)
CO2: 26 mEq/L (ref 19–32)
Creat: 1.06 mg/dL (ref 0.50–1.10)
GFR, Est African American: 74 mL/min
GFR, Est Non African American: 64 mL/min
Glucose, Bld: 86 mg/dL (ref 70–99)
POTASSIUM: 3.9 meq/L (ref 3.5–5.3)
SODIUM: 140 meq/L (ref 135–145)
Total Bilirubin: 0.4 mg/dL (ref 0.2–1.2)
Total Protein: 7.4 g/dL (ref 6.0–8.3)

## 2014-05-07 LAB — CBC
HCT: 34.9 % — ABNORMAL LOW (ref 36.0–46.0)
Hemoglobin: 11.3 g/dL — ABNORMAL LOW (ref 12.0–15.0)
MCH: 27 pg (ref 26.0–34.0)
MCHC: 32.4 g/dL (ref 30.0–36.0)
MCV: 83.3 fL (ref 78.0–100.0)
MPV: 10.8 fL (ref 9.4–12.4)
PLATELETS: 279 10*3/uL (ref 150–400)
RBC: 4.19 MIL/uL (ref 3.87–5.11)
RDW: 14.2 % (ref 11.5–15.5)
WBC: 5.5 10*3/uL (ref 4.0–10.5)

## 2014-05-07 LAB — LIPID PANEL
CHOL/HDL RATIO: 4.6 ratio
Cholesterol: 171 mg/dL (ref 0–200)
HDL: 37 mg/dL — AB (ref >39)
LDL CALC: 113 mg/dL — AB (ref 0–99)
Triglycerides: 104 mg/dL (ref <150)
VLDL: 21 mg/dL (ref 0–40)

## 2014-05-07 LAB — TSH: TSH: 1.962 u[IU]/mL (ref 0.350–4.500)

## 2014-05-07 LAB — HEMOGLOBIN A1C
Hgb A1c MFr Bld: 6.1 % — ABNORMAL HIGH (ref <5.7)
MEAN PLASMA GLUCOSE: 128 mg/dL — AB (ref <117)

## 2014-05-08 ENCOUNTER — Ambulatory Visit (INDEPENDENT_AMBULATORY_CARE_PROVIDER_SITE_OTHER): Payer: Medicare Other | Admitting: Family Medicine

## 2014-05-08 ENCOUNTER — Encounter (INDEPENDENT_AMBULATORY_CARE_PROVIDER_SITE_OTHER): Payer: Self-pay

## 2014-05-08 ENCOUNTER — Encounter: Payer: Self-pay | Admitting: Family Medicine

## 2014-05-08 ENCOUNTER — Ambulatory Visit (INDEPENDENT_AMBULATORY_CARE_PROVIDER_SITE_OTHER): Payer: Medicare Other

## 2014-05-08 VITALS — BP 96/60 | HR 82 | Resp 18 | Ht 63.0 in | Wt 157.0 lb

## 2014-05-08 DIAGNOSIS — E785 Hyperlipidemia, unspecified: Secondary | ICD-10-CM

## 2014-05-08 DIAGNOSIS — Z0000 Encounter for general adult medical examination without abnormal findings: Secondary | ICD-10-CM

## 2014-05-08 DIAGNOSIS — Z23 Encounter for immunization: Secondary | ICD-10-CM | POA: Diagnosis not present

## 2014-05-08 DIAGNOSIS — E119 Type 2 diabetes mellitus without complications: Secondary | ICD-10-CM

## 2014-05-08 LAB — MICROALBUMIN / CREATININE URINE RATIO
Creatinine, Urine: 264.2 mg/dL
Microalb Creat Ratio: 9.5 mg/g (ref 0.0–30.0)
Microalb, Ur: 2.5 mg/dL — ABNORMAL HIGH (ref <2.0)

## 2014-05-08 NOTE — Progress Notes (Signed)
Subjective:    Patient ID: Victoria [name], female    DOB: 1970-08-02, 43 y.o.   MRN: [id number]  HPI Preventive Screening-Counseling & Management   Patient present here today for a Medicare annual wellness visit.   Current Problems (verified)   Medications Prior to Visit Allergies (verified)   PAST HISTORY  Family History (updated)   Social History disabled mother of 1 , no nicotine , alcohol or drug use  Risk Factors  Current exercise habits:  Limited due to mobility problems  Dietary issues discussed: Avoids fried foods, relies on family for meals   Cardiac risk factors: diabetes  Depression Screen  (Note: if answer to either of the following is "Yes", a more complete depression screening is indicated)   Over the past two weeks, have you felt down, depressed or hopeless? No  Over the past two weeks, have you felt little interest or pleasure in doing things? No  Have you lost interest or pleasure in daily life? No  Do you often feel hopeless? No  Do you cry easily over simple problems? No   Activities of Daily Living  In your present state of health, do you have any difficulty performing the following activities?  Driving?: Yes, incapable Managing money?: Yes Feeding yourself?:No Getting from bed to chair?:yes Climbing a flight of stairs?: Yes due to balance problems Preparing food and eating?:incapable Bathing or showering?: Yes Getting dressed?: Yes Getting to the toilet?:No Using the toilet?:yes Moving around from place to place?: No  Fall Risk Assessment In the past year have you fallen or had a near fall?:yes several times in the past 12 months when trying to Are you currently taking any medications that make you dizzy?:No   Hearing Difficulties: No Do you often ask people to speak up or repeat themselves?:No Do you experience ringing or noises in your ears?:No Do you have difficulty understanding soft or whispered voices?:No  Cognitive Testing    Alert? Yes Normal Appearance?Yes  Oriented to person? no Place? No Time? No  Displays appropriate judgment? No  Can read the correct time from a watch face? No  Are you having problems remembering things? Yes  Advanced Directives have been discussed with the patient?Yes brochure given to guardian , HPOA, her mother, she is undecided  Currently as to whether she wants ressucitation in the event of an arrest   List the Names of Other Physician/Practitioners you currently use: Updated    Indicate any recent Medical Services you may have received from other than [facility] providers in the past year (date may be approximate).   Assessment:    Annual Wellness Exam   Plan:    Patient Instructions (the written plan) was given to the patient.  Medicare Attestation  I have personally reviewed:  The patient's medical and social history  Their use of alcohol, tobacco or illicit drugs  Their current medications and supplements  The patient's functional ability including ADLs,fall risks, home safety risks, cognitive, and hearing and visual impairment  Diet and physical activities  Evidence for depression or mood disorders  The patient's weight, height, BMI, and visual acuity have been recorded in the chart. I have made referrals, counseling, and provided education to the patient based on review of the above and I have provided the patient with a written personalized care plan for preventive services.      Review of Systems     Objective:   Physical Exam  BP 96/60 mmHg  Pulse 82  Resp  18  Ht 5\' 3"  (1.6 m)  Wt 157 lb 0.6 oz (71.233 kg)  BMI 27.83 kg/m2  SpO2 96%       Assessment & Plan:  Medicare annual wellness visit, subsequent Annual exam as documented. Counseling done  re healthy lifestyle involving commitment to 150 minutes exercise per week, heart healthy diet, and attaining healthy weight.The importance of adequate sleep also discussed. Regular seat belt use and home  safety, is also discussed. Changes in health habits are decided on by the patient with goals and time frames  set for achieving them. Immunization and cancer screening needs are specifically addressed at this visit.   Need for prophylactic vaccination and inoculation against influenza Vaccine administered at visit.

## 2014-05-08 NOTE — Patient Instructions (Addendum)
CPE in 4 month,call if you need me before.Marland Kitchen    Pls discuss the spotting with gyne  [facility]   Try to get help at home as needed on a regular basis  Flu vaccine   Tylenol 500 mg one daily as needed, for pain is safe  Bran and prune juice daily for bowel movement, then stool softener oTC iis good  Be careful not to fall  Fasting lipid, cmop and EGFr and hBA1C in 4 month

## 2014-05-11 DIAGNOSIS — Z0000 Encounter for general adult medical examination without abnormal findings: Secondary | ICD-10-CM | POA: Insufficient documentation

## 2014-05-11 DIAGNOSIS — Z23 Encounter for immunization: Secondary | ICD-10-CM | POA: Insufficient documentation

## 2014-05-11 NOTE — Assessment & Plan Note (Signed)
Vaccine administered at visit.  

## 2014-05-11 NOTE — Assessment & Plan Note (Signed)

## 2014-05-22 ENCOUNTER — Ambulatory Visit (INDEPENDENT_AMBULATORY_CARE_PROVIDER_SITE_OTHER): Payer: Medicare Other | Admitting: Adult Health

## 2014-05-22 ENCOUNTER — Encounter: Payer: Self-pay | Admitting: Adult Health

## 2014-05-22 DIAGNOSIS — Z3042 Encounter for surveillance of injectable contraceptive: Secondary | ICD-10-CM | POA: Diagnosis not present

## 2014-05-22 MED ORDER — MEDROXYPROGESTERONE ACETATE 150 MG/ML IM SUSP
150.0000 mg | Freq: Once | INTRAMUSCULAR | Status: AC
  Administered 2014-05-22: 150 mg via INTRAMUSCULAR

## 2014-05-22 NOTE — Progress Notes (Signed)
Pt here for Depo shot. Unable to get urine sample. Pt is on Depo for period management not contraception management. Pt was here with her mom and mom stated she is not sexually active. I spoke with Manus Gunning, CNM and she advised it's ok to give shot without urine sample. Pt is having some bleeding with the Depo and wants med to help with this. Advised would need to schedule an appt for one day next week to discuss. [place]

## 2014-05-27 ENCOUNTER — Encounter (HOSPITAL_COMMUNITY): Payer: Medicare Other

## 2014-05-28 ENCOUNTER — Ambulatory Visit: Payer: Medicare Other | Admitting: Adult Health

## 2014-05-29 DIAGNOSIS — G10 Huntington's disease: Secondary | ICD-10-CM | POA: Diagnosis not present

## 2014-05-29 DIAGNOSIS — F0281 Dementia in other diseases classified elsewhere with behavioral disturbance: Secondary | ICD-10-CM | POA: Diagnosis not present

## 2014-06-02 ENCOUNTER — Ambulatory Visit (INDEPENDENT_AMBULATORY_CARE_PROVIDER_SITE_OTHER): Payer: Medicare Other | Admitting: Adult Health

## 2014-06-02 ENCOUNTER — Encounter: Payer: Self-pay | Admitting: Adult Health

## 2014-06-02 VITALS — BP 94/70 | Ht 63.0 in | Wt 155.5 lb

## 2014-06-02 DIAGNOSIS — N920 Excessive and frequent menstruation with regular cycle: Secondary | ICD-10-CM

## 2014-06-02 HISTORY — DX: Excessive and frequent menstruation with regular cycle: N92.0

## 2014-06-02 NOTE — Patient Instructions (Signed)

## 2014-06-02 NOTE — Progress Notes (Signed)
Subjective:     Patient ID: Victoria [name], female   DOB: Jun 28, 1970, 43 y.o.   MRN: [id number]  HPI [name] is a 43 year old black female in complaining of bleeding for 10 days every month on depo, its heavy about 6-7 days.No pain.She has Huntington's.Last Depo 05/22/14.  Review of Systems See HPI Reviewed past medical,surgical, social and family history. Reviewed medications and allergies.     Objective:   Physical Exam BP 94/70 mmHg  Ht 5\' 3"  (1.6 m)  Wt 155 lb 8 oz (70.534 kg)  BMI 27.55 kg/m2  LMP 05/04/2014 (Approximate)   No exam just talk with her caregiver, will get [place] to assess uterus and sent container home to get urine, she is not sexually active.  Assessment:     Menorrhagia on depo    Plan:     Return in 1 week for gyn [place] and see me   review handout on menorrhagia

## 2014-06-03 ENCOUNTER — Ambulatory Visit (HOSPITAL_COMMUNITY)
Admission: RE | Admit: 2014-06-03 | Discharge: 2014-06-03 | Disposition: A | Discharge status: Home / Self Care | Payer: Medicare Other | Source: Ambulatory Visit | Attending: Family Medicine | Admitting: Family Medicine

## 2014-06-03 DIAGNOSIS — R921 Mammographic calcification found on diagnostic imaging of breast: Secondary | ICD-10-CM | POA: Insufficient documentation

## 2014-06-03 DIAGNOSIS — Z1231 Encounter for screening mammogram for malignant neoplasm of breast: Secondary | ICD-10-CM | POA: Diagnosis not present

## 2014-06-03 DIAGNOSIS — R920 Mammographic microcalcification found on diagnostic imaging of breast: Secondary | ICD-10-CM

## 2014-06-10 ENCOUNTER — Encounter: Payer: Self-pay | Admitting: Adult Health

## 2014-06-10 ENCOUNTER — Ambulatory Visit (INDEPENDENT_AMBULATORY_CARE_PROVIDER_SITE_OTHER): Payer: Medicare Other | Admitting: Adult Health

## 2014-06-10 ENCOUNTER — Ambulatory Visit (INDEPENDENT_AMBULATORY_CARE_PROVIDER_SITE_OTHER): Payer: Medicare Other

## 2014-06-10 ENCOUNTER — Other Ambulatory Visit: Payer: Self-pay | Admitting: Adult Health

## 2014-06-10 VITALS — BP 122/84 | Ht 63.0 in | Wt 156.5 lb

## 2014-06-10 DIAGNOSIS — N920 Excessive and frequent menstruation with regular cycle: Secondary | ICD-10-CM

## 2014-06-10 DIAGNOSIS — D259 Leiomyoma of uterus, unspecified: Secondary | ICD-10-CM

## 2014-06-10 DIAGNOSIS — D219 Benign neoplasm of connective and other soft tissue, unspecified: Secondary | ICD-10-CM | POA: Insufficient documentation

## 2014-06-10 HISTORY — DX: Benign neoplasm of connective and other soft tissue, unspecified: D21.9

## 2014-06-10 NOTE — Patient Instructions (Signed)
Abdominal Hysterectomy Abdominal hysterectomy is a surgical procedure to remove your womb (uterus). Your uterus is the muscular organ that contains a developing baby. This surgery is done for many reasons. You may need an abdominal hysterectomy if you have cancer, growths (tumors), long-term pain, or bleeding. You may also have this procedure if your uterus has slipped down into your vagina (uterine prolapse). Depending on why you need an abdominal hysterectomy, you may also have other reproductive organs removed. These could include the part of your vagina that connects with your uterus (cervix), the organs that make eggs (ovaries), and the tubes that connect the ovaries to the uterus (fallopian tubes). LET YOUR HEALTH CARE PROVIDER KNOW ABOUT:   Any allergies you have.  All medicines you are taking, including vitamins, herbs, eye drops, creams, and over-the-counter medicines.  Previous problems you or members of your family have had with the use of anesthetics.  Any blood disorders you have.  Previous surgeries you have had.  Medical conditions you have. RISKS AND COMPLICATIONS Generally, this is a safe procedure. However, as with any procedure, problems can occur. Infection is the most common problem after an abdominal hysterectomy. Other possible problems include:  Bleeding.  Formation of blood clots that may break free and travel to your lungs.  Injury to other organs near your uterus.  Nerve injury causing nerve pain.  Decreased interest in sex or pain during sexual intercourse. BEFORE THE PROCEDURE  Abdominal hysterectomy is a major surgical procedure. It can affect the way you feel about yourself. Talk to your health care provider about the physical and emotional changes hysterectomy may cause.  You may need to have blood work and X-rays done before surgery.  Quit smoking if you smoke. Ask your health care provider for help if you are struggling to quit.  Stop taking  medicines that thin your blood as directed by your health care provider.  You may be instructed to take antibiotic medicines or laxatives before surgery.  Do not eat or drink anything for 6-8 hours before surgery.  Take your regular medicines with a small sip of water.  Bathe or shower the night or morning before surgery. PROCEDURE  Abdominal hysterectomy is done in the operating room at the hospital.  In most cases, you will be given a medicine that makes you go to sleep (general anesthetic).  The surgeon will make a cut (incision) through the skin in your lower belly.  The incision may be about 5-7 inches long. It may go side-to-side or up-and-down.  The surgeon will move aside the body tissue that covers your uterus. The surgeon will then carefully take out your uterus along with any of your other reproductive organs that need to be removed.  Bleeding will be controlled with clamps or sutures.  The surgeon will close your incision with sutures or metal clips. AFTER THE PROCEDURE  You will have some pain immediately after the procedure.  You will be given pain medicine in the recovery room.  You will be taken to your hospital room when you have recovered from the anesthesia.  You may need to stay in the hospital for 2-5 days.  You will be given instructions for recovery at home. Document Released: 06/11/2013 Document Reviewed: 06/11/2013 [employer] Patient Information 2015 [employer]. This information is not intended to replace advice given to you by your health care provider. Make sure you discuss any questions you have with your health care provider. Uterine Fibroid A uterine fibroid is a   growth (tumor) that occurs in your uterus. This type of tumor is not cancerous and does not spread out of the uterus. You can have one or many fibroids. Fibroids can vary in size, weight, and where they grow in the uterus. Some can become quite large. Most fibroids do not require medical  treatment, but some can cause pain or heavy bleeding during and between periods. CAUSES  A fibroid is the result of a single uterine cell that keeps growing (unregulated), which is different than most cells in the human body. Most cells have a control mechanism that keeps them from reproducing without control.  SIGNS AND SYMPTOMS   Bleeding.  Pelvic pain and pressure.  Bladder problems due to the size of the fibroid.  Infertility and miscarriages depending on the size and location of the fibroid. DIAGNOSIS  Uterine fibroids are diagnosed through a physical exam. Your health care provider may feel the lumpy tumors during a pelvic exam. Ultrasonography may be done to get information regarding size, location, and number of tumors.  TREATMENT   Your health care provider may recommend watchful waiting. This involves getting the fibroid checked by your health care provider to see if it grows or shrinks.   Hormone treatment or an intrauterine device (IUD) may be prescribed.   Surgery may be needed to remove the fibroids (myomectomy) or the uterus (hysterectomy). This depends on your situation. When fibroids interfere with fertility and a woman wants to become pregnant, a health care provider may recommend having the fibroids removed.  Centre care depends on how you were treated. In general:   Keep all follow-up appointments with your health care provider.   Only take over-the-counter or prescription medicines as directed by your health care provider. If you were prescribed a hormone treatment, take the hormone medicines exactly as directed. Do not take aspirin. It can cause bleeding.   Talk to your health care provider about taking iron pills.  If your periods are troublesome but not so heavy, lie down with your feet raised slightly above your heart. Place cold packs on your lower abdomen.   If your periods are heavy, write down the number of pads or tampons you  use per month. Bring this information to your health care provider.   Include green vegetables in your diet.  SEEK IMMEDIATE MEDICAL CARE IF:  You have pelvic pain or cramps not controlled with medicines.   You have a sudden increase in pelvic pain.   You have an increase in bleeding between and during periods.   You have excessive periods and soak tampons or pads in a half hour or less.  You feel lightheaded or have fainting episodes. Document Released: 06/03/2000 Document Revised: 03/27/2013 Document Reviewed: 01/03/2013 [employer] Patient Information 2015 [place], [place]. This information is not intended to replace advice given to you by your health care provider. Make sure you discuss any questions you have with your health care provider. Return in 1 week to talk with Dr Glo Herring

## 2014-06-10 NOTE — Progress Notes (Signed)
Subjective:     Patient ID: Victoria [name], female   DOB: 08-12-1970, 43 y.o.   MRN: [id number]  HPI Victoria [name]  Is a 43 year old black female in for [place] for menorrhagia with depo,bleeding has stopped for now.  Review of Systems See HPI Reviewed past medical,surgical, social and family history. Reviewed medications and allergies.     Objective:   Physical Exam BP 122/84 mmHg  Ht 5\' 3"  (1.6 m)  Wt 156 lb 8 oz (70.988 kg)  BMI 27.73 kg/m2  LMP 05/04/2014 (Approximate) Uterus 8.9 x 7.6 x 6.9 cm, anteverted with multiple fibroids noted Largest fibroid=2.8cm  Endometrium 7.3 mm, distorted by multiple fibroids  Right ovary 2.7 x 1.7 x 1.6 cm,   Left ovary 2.4 x 1.6 x 1.4 cm,   No free fluid or adnexal masses noted within the pelvis  Technician Comments:  Anteverted uterus with multiple fibroids noted, ENdom-7.77mm, bilateral ovaries appear WNL, no free fluid or adnexal masses noted within the pelvis US reviewed with pt and care giver.And discussed with Dr Glo Herring.     Assessment:     Fibroids Menorrhagia on depo    Plan:     Return in 1 week to talk with Dr Glo Herring about options,including hysterectomy Review handout on fibroids and hysterectomy

## 2014-06-18 ENCOUNTER — Ambulatory Visit: Payer: Medicare Other | Admitting: Obstetrics and Gynecology

## 2014-06-24 ENCOUNTER — Other Ambulatory Visit: Payer: Self-pay | Admitting: Family Medicine

## 2014-06-26 ENCOUNTER — Ambulatory Visit: Payer: Medicare Other | Admitting: Obstetrics and Gynecology

## 2014-06-27 ENCOUNTER — Ambulatory Visit: Payer: Medicare Other | Admitting: Obstetrics and Gynecology

## 2014-06-30 ENCOUNTER — Ambulatory Visit (INDEPENDENT_AMBULATORY_CARE_PROVIDER_SITE_OTHER): Payer: Medicare Other | Admitting: Obstetrics and Gynecology

## 2014-06-30 ENCOUNTER — Encounter: Payer: Self-pay | Admitting: Obstetrics and Gynecology

## 2014-06-30 VITALS — BP 122/76 | Wt 156.0 lb

## 2014-06-30 DIAGNOSIS — N921 Excessive and frequent menstruation with irregular cycle: Secondary | ICD-10-CM | POA: Diagnosis not present

## 2014-06-30 DIAGNOSIS — D251 Intramural leiomyoma of uterus: Secondary | ICD-10-CM | POA: Insufficient documentation

## 2014-06-30 NOTE — Patient Instructions (Signed)
Endometrial Ablation Endometrial ablation removes the lining of the uterus (endometrium). It is usually a same-day, outpatient treatment. Ablation helps avoid major surgery, such as surgery to remove the cervix and uterus (hysterectomy). After endometrial ablation, you will have little or no menstrual bleeding and may not be able to have children. However, if you are premenopausal, you will need to use a reliable method of birth control following the procedure because of the small chance that pregnancy can occur. There are different reasons to have this procedure, which include:  Heavy periods.  Bleeding that is causing anemia.  Irregular bleeding.  Bleeding fibroids on the lining inside the uterus if they are smaller than 3 centimeters. This procedure should not be done if:  You want children in the future.  You have severe cramps with your menstrual period.  You have precancerous or cancerous cells in your uterus.  You were recently pregnant.  You have gone through menopause.  You have had major surgery on the uterus, such as a cesarean delivery. LET YOUR HEALTH CARE PROVIDER KNOW ABOUT:  Any allergies you have.  All medicines you are taking, including vitamins, herbs, eye drops, creams, and over-the-counter medicines.  Previous problems you or members of your family have had with the use of anesthetics.  Any blood disorders you have.  Previous surgeries you have had.  Medical conditions you have. RISKS AND COMPLICATIONS  Generally, this is a safe procedure. However, as with any procedure, complications can occur. Possible complications include:  Perforation of the uterus.  Bleeding.  Infection of the uterus, bladder, or vagina.  Injury to surrounding organs.  An air bubble to the lung (air embolus).  Pregnancy following the procedure.  Failure of the procedure to help the problem, requiring hysterectomy.  Decreased ability to diagnose cancer in the lining of  the uterus. BEFORE THE PROCEDURE  The lining of the uterus must be tested to make sure there is no pre-cancerous or cancer cells present.  An ultrasound may be performed to look at the size of the uterus and to check for abnormalities.  Medicines may be given to thin the lining of the uterus. PROCEDURE  During the procedure, your health care provider will use a tool called a resectoscope to help see inside your uterus. There are different ways to remove the lining of your uterus.   Radiofrequency - This method uses a radiofrequency-alternating electric current to remove the lining of the uterus.  Cryotherapy - This method uses extreme cold to freeze the lining of the uterus.  Heated-Free Liquid - This method uses heated salt (saline) solution to remove the lining of the uterus.  Microwave - This method uses high-energy microwaves to heat up the lining of the uterus to remove it.  Thermal balloon - This method involves inserting a catheter with a balloon tip into the uterus. The balloon tip is filled with heated fluid to remove the lining of the uterus. AFTER THE PROCEDURE  After your procedure, do not have sexual intercourse or insert anything into your vagina until permitted by your health care provider. After the procedure, you may experience:  Cramps.  Vaginal discharge.  Frequent urination. Document Released: 04/15/2004 Document Revised: 02/06/2013 Document Reviewed: 11/07/2012 [employer] Patient Information 2015 [employer]. This information is not intended to replace advice given to you by your health care provider. Make sure you discuss any questions you have with your health care provider.  

## 2014-06-30 NOTE — Progress Notes (Signed)
Patient ID: Victoria [name], female   DOB: 09-18-1970, 44 y.o.   MRN: [id number] Pt here today for consult to discuss what her options are for bleeding. Pt is currently taking DEPO and still has bleeding.

## 2014-06-30 NOTE — Progress Notes (Signed)
Patient ID: [name], female   DOB: 1971/01/10, 44 y.o.   MRN: [id number]   [facility] Visit  Patient name: Victoria [name] MRN [id number]  Date of birth: June 15, 1971  CC & HPI:  Victoria [name] is a 44 y.o. female presenting today for abnormal vaginal bleeding.  The patient's mother states that the patient will experience 10-12 days of bleeding.  She states that the patient will experience 5 days of heavy bleeding.  She had one child vaginally and is not currently sexually active.  She is using Depo and is still able to have a menses.    ROS:  All systems have been reviewed and are negative unless otherwise indicated in the HPI.  Pertinent History Reviewed:   Reviewed: Significant for Huntington's disease. Medical         Past Medical History  Diagnosis Date  . Hypertension   . Obesity   . Dyslipidemia   . Mild cognitive impairment   . Perennial allergic rhinitis   . Diabetes mellitus   . Anxiety   . Huntington's disease   . Menorrhagia with regular cycle 06/02/2014  . Fibroids 06/10/2014                              Surgical Hx:   History reviewed. No pertinent past surgical history. Medications: Reviewed & Updated - see associated section                      Current outpatient prescriptions: acetaminophen (TYLENOL) 500 MG tablet, Take 500 mg by mouth. As needed , Disp: , Rfl: ;  Blood Glucose Monitoring Suppl (PRODIGY PREFERRED MONITOR) W/DEVICE KIT, by Does not apply route daily.  , Disp: , Rfl: ;  cetirizine (ZYRTEC) 10 MG tablet, TAKE ONE (1) TABLET EACH DAY, Disp: 30 tablet, Rfl: 3;  citalopram (CELEXA) 20 MG tablet, Take 20 mg by mouth daily.  , Disp: , Rfl:  Clobetasol Propionate (CLOBEX) 0.05 % shampoo, Apply topically twice daily, Disp: 118 mL, Rfl: 4;  ferrous sulfate 324 (65 FE) MG TBEC, Take by mouth daily.  , Disp: , Rfl: ;  galantamine (RAZADYNE ER) 24 MG 24 hr capsule, Take 24 mg by mouth daily.  , Disp: , Rfl: ;  Lancets MISC, 1 each by Does not  apply route once. Once daily, testing dx 250.00, Disp: 100 each, Rfl: 2 lovastatin (MEVACOR) 20 MG tablet, TAKE ONE (1) TABLET AT BEDTIME, Disp: 30 tablet, Rfl: 4;  medroxyPROGESTERone (DEPO-PROVERA) 150 MG/ML injection, Inject 1 mL (150 mg total) into the muscle every 3 (three) months., Disp: 1 mL, Rfl: 3;  memantine (NAMENDA) 10 MG tablet, Take 10 mg by mouth 2 (two) times daily.  , Disp: , Rfl: ;  metFORMIN (GLUCOPHAGE) 500 MG tablet, TAKE ONE TABLET EACH DAY WITH BREAKFAST, Disp: 30 tablet, Rfl: 3 Multiple Vitamin (MULTIVITAMIN PO), Take by mouth daily.  , Disp: , Rfl: ;  Omega-3 Fatty Acids (FISH OIL) 1000 MG CAPS, Take by mouth 2 (two) times daily.  , Disp: , Rfl: ;  ramipril (ALTACE) 2.5 MG capsule, Take 1 capsule (2.5 mg total) by mouth daily., Disp: 90 capsule, Rfl: 3;  traZODone (DESYREL) 50 MG tablet, Take 50 mg by mouth at bedtime., Disp: , Rfl:  [DISCONTINUED] QUEtiapine (SEROQUEL) 50 MG tablet, Take 50 mg by mouth at bedtime.  , Disp: , Rfl:    Social History: Reviewed -  reports that she has never smoked. She has never used smokeless tobacco.  Objective Findings:  Vitals: Blood pressure 122/76, weight 156 lb (70.761 kg), last menstrual period 05/04/2014.  Physical Examination: Discussion only. Exam by Smitty Cords last week pt does poorly with exams.  Assessment & Plan:   A:  1. Menorrhagia, despite DepoProvera tx 2. Intramural fibroids 2. Huntington's disease  P:  1. Explore option of endometrial ablation, Novasure vs thermachoice. 2.   This chart was scribed for [name], MD by Donato Schultz, ED Scribe. The patient's care was started at 2:19 PM.

## 2014-07-28 ENCOUNTER — Telehealth: Payer: Self-pay | Admitting: Obstetrics and Gynecology

## 2014-07-28 NOTE — Telephone Encounter (Signed)
Routed to incorrect Provider, will re route to appropriate MD

## 2014-07-30 ENCOUNTER — Telehealth: Payer: Self-pay | Admitting: Obstetrics and Gynecology

## 2014-07-30 NOTE — Telephone Encounter (Signed)
Pt desires procedure to resolve bleeding. Periods describes periods as prolonged and heavy. [name] mother must care for pt and manage menses. We will have pt come in for endometrial biopsy , GC/Chl,and then schedule endometrial ablation. Transferred to front for appt.

## 2014-07-30 NOTE — Telephone Encounter (Signed)
preop endometrial bx and preop appt scheduled.

## 2014-08-06 ENCOUNTER — Other Ambulatory Visit: Payer: Self-pay | Admitting: Family Medicine

## 2014-08-13 ENCOUNTER — Encounter: Payer: Self-pay | Admitting: Obstetrics and Gynecology

## 2014-08-13 ENCOUNTER — Ambulatory Visit (INDEPENDENT_AMBULATORY_CARE_PROVIDER_SITE_OTHER): Payer: Medicare Other | Admitting: Obstetrics and Gynecology

## 2014-08-13 ENCOUNTER — Other Ambulatory Visit: Payer: Self-pay | Admitting: Obstetrics and Gynecology

## 2014-08-13 VITALS — BP 120/70 | Ht 62.0 in | Wt 157.0 lb

## 2014-08-13 DIAGNOSIS — Z3202 Encounter for pregnancy test, result negative: Secondary | ICD-10-CM

## 2014-08-13 DIAGNOSIS — N920 Excessive and frequent menstruation with regular cycle: Secondary | ICD-10-CM | POA: Diagnosis not present

## 2014-08-13 DIAGNOSIS — N939 Abnormal uterine and vaginal bleeding, unspecified: Secondary | ICD-10-CM | POA: Diagnosis not present

## 2014-08-13 DIAGNOSIS — Z3200 Encounter for pregnancy test, result unknown: Secondary | ICD-10-CM

## 2014-08-13 DIAGNOSIS — Z01818 Encounter for other preprocedural examination: Secondary | ICD-10-CM

## 2014-08-13 LAB — POCT URINE PREGNANCY: Preg Test, Ur: NEGATIVE

## 2014-08-13 MED ORDER — MEGESTROL ACETATE 40 MG PO TABS: 40.0000 mg | ORAL_TABLET | Freq: Every day | ORAL | Status: DC

## 2014-08-13 NOTE — Progress Notes (Addendum)
Patient ID: Victoria [name], female   DOB: 1970-06-23, 44 y.o.   MRN: [id number] Extra time due to mental retardation This chart was scribed for [name], MD by Stephania Fragmin, ED Scribe. This patient was seen in room 1 and the patient's care was started at 12:17 PM.   Patient's mother reports that she has vaginal bleeding once a month., lasting several days to 12 days. Endometrial Biopsy: Patient given informed consent, signed copy in the chart, time out was performed. Time out taken. The patient was placed in the lithotomy position and the cervix brought into view with sterile speculum.  Portion of cervix cleansed x 2 with betadine swabs.  A tenaculum was placed in the anterior lip of the cervix. The uterus was sounded for depth of 10 cm,. Milex uterine Explora 3 mm was introduced to into the uterus, suction created,  and an endometrial sample was obtained. All equipment was removed and accounted for.   The patient tolerated the procedure well.    Patient given post procedure instructions.  Followup: I will call patient's family with biopsy results in 1 week. Plan: I will prescribe Megace 40 mg as needed for vaginal bleeding.    pt 's mom to receive call with results   I personally performed the services described in this documentation, which was scribed in my presence. The recorded information has been reviewed and considered accurate. It has been edited as necessary during review. [name], MD

## 2014-08-13 NOTE — Patient Instructions (Signed)
Expect a call with results in a week.

## 2014-08-14 ENCOUNTER — Ambulatory Visit: Payer: Medicare Other

## 2014-08-19 ENCOUNTER — Ambulatory Visit (INDEPENDENT_AMBULATORY_CARE_PROVIDER_SITE_OTHER): Payer: Medicare Other | Admitting: *Deleted

## 2014-08-19 DIAGNOSIS — Z3042 Encounter for surveillance of injectable contraceptive: Secondary | ICD-10-CM | POA: Diagnosis not present

## 2014-08-19 DIAGNOSIS — Z3202 Encounter for pregnancy test, result negative: Secondary | ICD-10-CM | POA: Diagnosis not present

## 2014-08-19 LAB — POCT URINE PREGNANCY: Preg Test, Ur: NEGATIVE

## 2014-08-19 MED ORDER — MEDROXYPROGESTERONE ACETATE 150 MG/ML IM SUSP
150.0000 mg | Freq: Once | INTRAMUSCULAR | Status: AC
  Administered 2014-08-19: 150 mg via INTRAMUSCULAR

## 2014-08-19 NOTE — Progress Notes (Signed)
Patient ID: Victoria [name], female   DOB: [birth year], 44 y.o.   MRN: [id number] Depo provera 150 mg IM right deltoid given with no complications, negative pregnancy test. Pt to return in 12 weeks for next depo injection. Pt caregiver informed of WNL Endometrial biopsy results from 08/13/2014.

## 2014-08-21 ENCOUNTER — Telehealth: Payer: Self-pay | Admitting: Obstetrics and Gynecology

## 2014-08-21 NOTE — Telephone Encounter (Signed)
Pathology known to pt's mother, who is legal guardian for medical care. LMP was 21 Jul 2014. Next menses due soon. Will schedule hysteroscopy D&C, endometrial ablation for 15 march at [facility].

## 2014-08-23 ENCOUNTER — Other Ambulatory Visit: Payer: Self-pay | Admitting: Family Medicine

## 2014-08-25 DIAGNOSIS — E785 Hyperlipidemia, unspecified: Secondary | ICD-10-CM | POA: Diagnosis not present

## 2014-08-25 DIAGNOSIS — E119 Type 2 diabetes mellitus without complications: Secondary | ICD-10-CM | POA: Diagnosis not present

## 2014-08-25 NOTE — Telephone Encounter (Signed)
Novasure scheduled per Dr Glo Herring 09-02-14

## 2014-08-26 ENCOUNTER — Ambulatory Visit (INDEPENDENT_AMBULATORY_CARE_PROVIDER_SITE_OTHER): Payer: Medicare Other | Admitting: Family Medicine

## 2014-08-26 ENCOUNTER — Encounter: Payer: Self-pay | Admitting: Family Medicine

## 2014-08-26 VITALS — BP 108/68 | HR 76 | Resp 18 | Ht 62.0 in | Wt 158.1 lb

## 2014-08-26 DIAGNOSIS — E663 Overweight: Secondary | ICD-10-CM | POA: Diagnosis not present

## 2014-08-26 DIAGNOSIS — Z23 Encounter for immunization: Secondary | ICD-10-CM | POA: Diagnosis not present

## 2014-08-26 DIAGNOSIS — E119 Type 2 diabetes mellitus without complications: Secondary | ICD-10-CM

## 2014-08-26 DIAGNOSIS — L219 Seborrheic dermatitis, unspecified: Secondary | ICD-10-CM

## 2014-08-26 DIAGNOSIS — G10 Huntington's disease: Secondary | ICD-10-CM

## 2014-08-26 DIAGNOSIS — J301 Allergic rhinitis due to pollen: Secondary | ICD-10-CM | POA: Diagnosis not present

## 2014-08-26 DIAGNOSIS — E785 Hyperlipidemia, unspecified: Secondary | ICD-10-CM | POA: Diagnosis not present

## 2014-08-26 DIAGNOSIS — Z1211 Encounter for screening for malignant neoplasm of colon: Secondary | ICD-10-CM

## 2014-08-26 DIAGNOSIS — L210 Seborrhea capitis: Secondary | ICD-10-CM

## 2014-08-26 DIAGNOSIS — R279 Unspecified lack of coordination: Secondary | ICD-10-CM | POA: Diagnosis not present

## 2014-08-26 DIAGNOSIS — N921 Excessive and frequent menstruation with irregular cycle: Secondary | ICD-10-CM | POA: Diagnosis not present

## 2014-08-26 DIAGNOSIS — G319 Degenerative disease of nervous system, unspecified: Secondary | ICD-10-CM

## 2014-08-26 LAB — LIPID PANEL
CHOL/HDL RATIO: 4.4 ratio
Cholesterol: 151 mg/dL (ref 0–200)
HDL: 34 mg/dL — ABNORMAL LOW (ref >=46)
LDL Cholesterol: 99 mg/dL (ref 0–99)
Triglycerides: 90 mg/dL (ref <150)
VLDL: 18 mg/dL (ref 0–40)

## 2014-08-26 LAB — COMPLETE METABOLIC PANEL WITH GFR
ALBUMIN: 4.1 g/dL (ref 3.5–5.2)
ALK PHOS: 46 U/L (ref 39–117)
ALT: 8 U/L (ref 0–35)
AST: 11 U/L (ref 0–37)
BUN: 12 mg/dL (ref 6–23)
CHLORIDE: 103 meq/L (ref 96–112)
CO2: 29 meq/L (ref 19–32)
Calcium: 9.4 mg/dL (ref 8.4–10.5)
Creat: 1.01 mg/dL (ref 0.50–1.10)
GFR, EST AFRICAN AMERICAN: 79 mL/min
GFR, Est Non African American: 68 mL/min
GLUCOSE: 83 mg/dL (ref 70–99)
POTASSIUM: 4.1 meq/L (ref 3.5–5.3)
SODIUM: 137 meq/L (ref 135–145)
TOTAL PROTEIN: 6.9 g/dL (ref 6.0–8.3)
Total Bilirubin: 0.5 mg/dL (ref 0.2–1.2)

## 2014-08-26 LAB — HEMOCCULT GUIAC POC 1CARD (OFFICE): FECAL OCCULT BLD: NEGATIVE

## 2014-08-26 LAB — HEMOGLOBIN A1C
Hgb A1c MFr Bld: 5.9 % — ABNORMAL HIGH (ref <5.7)
MEAN PLASMA GLUCOSE: 123 mg/dL — AB (ref <117)

## 2014-08-26 NOTE — Patient Instructions (Addendum)
F/Un in 4.5 month, call if you need me before  Prevnar todaY sTOOL TEST [id number]  LABS ARE EXCELLENT  We a re requesting 8  Hour help , and will call when form is ready   Info in healthy eating is given

## 2014-08-28 ENCOUNTER — Telehealth: Payer: Self-pay | Admitting: Family Medicine

## 2014-08-28 DIAGNOSIS — G10 Huntington's disease: Secondary | ICD-10-CM

## 2014-08-28 DIAGNOSIS — R1310 Dysphagia, unspecified: Secondary | ICD-10-CM

## 2014-08-28 DIAGNOSIS — F0281 Dementia in other diseases classified elsewhere with behavioral disturbance: Secondary | ICD-10-CM | POA: Diagnosis not present

## 2014-08-28 NOTE — Patient Instructions (Signed)
Victoria [name]  08/28/2014   Your procedure is scheduled on:  09/02/2014  Report to [facility] at  18  AM.  Call this number if you have problems the morning of surgery: [phone]   Remember:   Do not eat food or drink liquids after midnight.   Take these medicines the morning of surgery with A SIP OF WATER: zyrtec, celexa, razadyne, namenda, ramipril   Do not wear jewelry, make-up or nail polish.  Do not wear lotions, powders, or perfumes.   Do not shave 48 hours prior to surgery. Men may shave face and neck.  Do not bring valuables to the hospital.  [facility] is not responsible for any belongings or valuables.               Contacts, dentures or bridgework may not be worn into surgery.  Leave suitcase in the car. After surgery it may be brought to your room.  For patients admitted to the hospital, discharge time is determined by your treatment team.               Patients discharged the day of surgery will not be allowed to drive home.  Name and phone number of your driver: family  Special Instructions: Shower using CHG 2 nights before surgery and the night before surgery.  If you shower the day of surgery use CHG.  Use special wash - you have one bottle of CHG for all showers.  You should use approximately 1/3 of the bottle for each shower.   Please read over the following fact sheets that you were given: Pain Booklet, Coughing and Deep Breathing, Surgical Site Infection Prevention, Anesthesia Post-op Instructions and Care and Recovery After Surgery Endometrial Ablation Endometrial ablation removes the lining of the uterus (endometrium). It is usually a same-day, outpatient treatment. Ablation helps avoid major surgery, such as surgery to remove the cervix and uterus (hysterectomy). After endometrial ablation, you will have little or no menstrual bleeding and may not be able to have children. However, if you are premenopausal, you will need to use a reliable method of  birth control following the procedure because of the small chance that pregnancy can occur. There are different reasons to have this procedure, which include:  Heavy periods.  Bleeding that is causing anemia.  Irregular bleeding.  Bleeding fibroids on the lining inside the uterus if they are smaller than 3 centimeters. This procedure should not be done if:  You want children in the future.  You have severe cramps with your menstrual period.  You have precancerous or cancerous cells in your uterus.  You were recently pregnant.  You have gone through menopause.  You have had major surgery on the uterus, such as a cesarean delivery. LET [facility] CARE PROVIDER KNOW ABOUT:  Any allergies you have.  All medicines you are taking, including vitamins, herbs, eye drops, creams, and over-the-counter medicines.  Previous problems you or members of your family have had with the use of anesthetics.  Any blood disorders you have.  Previous surgeries you have had.  Medical conditions you have. RISKS AND COMPLICATIONS  Generally, this is a safe procedure. However, as with any procedure, complications can occur. Possible complications include:  Perforation of the uterus.  Bleeding.  Infection of the uterus, bladder, or vagina.  Injury to surrounding organs.  An air bubble to the lung (air embolus).  Pregnancy following the procedure.  Failure of the procedure  to help the problem, requiring hysterectomy.  Decreased ability to diagnose cancer in the lining of the uterus. BEFORE THE PROCEDURE  The lining of the uterus must be tested to make sure there is no pre-cancerous or cancer cells present.  An ultrasound may be performed to look at the size of the uterus and to check for abnormalities.  Medicines may be given to thin the lining of the uterus. PROCEDURE  During the procedure, your health care provider will use a tool called a resectoscope to help see inside your  uterus. There are different ways to remove the lining of your uterus.   Radiofrequency - This method uses a radiofrequency-alternating electric current to remove the lining of the uterus.  Cryotherapy - This method uses extreme cold to freeze the lining of the uterus.  Heated-Free Liquid - This method uses heated salt (saline) solution to remove the lining of the uterus.  Microwave - This method uses high-energy microwaves to heat up the lining of the uterus to remove it.  Thermal balloon - This method involves inserting a catheter with a balloon tip into the uterus. The balloon tip is filled with heated fluid to remove the lining of the uterus. AFTER THE PROCEDURE  After your procedure, do not have sexual intercourse or insert anything into your vagina until permitted by your health care provider. After the procedure, you may experience:  Cramps.  Vaginal discharge.  Frequent urination. Document Released: 04/15/2004 Document Revised: 02/06/2013 Document Reviewed: 11/07/2012 [facility] Patient Information 2015 [place], [place]. This information is not intended to replace advice given to you by your health care provider. Make sure you discuss any questions you have with your health care provider. Hysteroscopy Hysteroscopy is a procedure used for looking inside the womb (uterus). It may be done for various reasons, including:  To evaluate abnormal bleeding, fibroid (benign, noncancerous) tumors, polyps, scar tissue (adhesions), and possibly cancer of the uterus.  To look for lumps (tumors) and other uterine growths.  To look for causes of why a woman cannot get pregnant (infertility), causes of recurrent loss of pregnancy (miscarriages), or a lost intrauterine device (IUD).  To perform a sterilization by blocking the fallopian tubes from inside the uterus. In this procedure, a thin, flexible tube with a tiny light and camera on the end of it (hysteroscope) is used to look inside the uterus.  A hysteroscopy should be done right after a menstrual period to be sure you are not pregnant. LET [facility] CARE PROVIDER KNOW ABOUT:   Any allergies you have.  All medicines you are taking, including vitamins, herbs, eye drops, creams, and over-the-counter medicines.  Previous problems you or members of your family have had with the use of anesthetics.  Any blood disorders you have.  Previous surgeries you have had.  Medical conditions you have. RISKS AND COMPLICATIONS  Generally, this is a safe procedure. However, as with any procedure, complications can occur. Possible complications include:  Putting a hole in the uterus.  Excessive bleeding.  Infection.  Damage to the cervix.  Injury to other organs.  Allergic reaction to medicines.  Too much fluid used in the uterus for the procedure. BEFORE THE PROCEDURE   Ask your health care provider about changing or stopping any regular medicines.  Do not take aspirin or blood thinners for 1 week before the procedure, or as directed by your health care provider. These can cause bleeding.  If you smoke, do not smoke for 2 weeks before the procedure.  In some cases, a medicine is placed in the cervix the day before the procedure. This medicine makes the cervix have a larger opening (dilate). This makes it easier for the instrument to be inserted into the uterus during the procedure.  Do not eat or drink anything for at least 8 hours before the surgery.  Arrange for someone to take you home after the procedure. PROCEDURE   You may be given a medicine to relax you (sedative). You may also be given one of the following:  A medicine that numbs the area around the cervix (local anesthetic).  A medicine that makes you sleep through the procedure (general anesthetic).  The hysteroscope is inserted through the vagina into the uterus. The camera on the hysteroscope sends a picture to a TV screen. This gives the surgeon a good view  inside the uterus.  During the procedure, air or a liquid is put into the uterus, which allows the surgeon to see better.  Sometimes, tissue is gently scraped from inside the uterus. These tissue samples are sent to a lab for testing. AFTER THE PROCEDURE   If you had a general anesthetic, you may be groggy for a couple hours after the procedure.  If you had a local anesthetic, you will be able to go home as soon as you are stable and feel ready.  You may have some cramping. This normally lasts for a couple days.  You may have bleeding, which varies from light spotting for a few days to menstrual-like bleeding for 3-7 days. This is normal.  If your test results are not back during the visit, make an appointment with your health care provider to find out the results. Document Released: 09/12/2000 Document Revised: 03/27/2013 Document Reviewed: 01/03/2013 [facility] Patient Information 2015 [place], [place]. This information is not intended to replace advice given to you by your health care provider. Make sure you discuss any questions you have with your health care provider. Dilation and Curettage or Vacuum Curettage Dilation and curettage (D&C) and vacuum curettage are minor procedures. A D&C involves stretching (dilation) the cervix and scraping (curettage) the inside lining of the womb (uterus). During a D&C, tissue is gently scraped from the inside lining of the uterus. During a vacuum curettage, the lining and tissue in the uterus are removed with the use of gentle suction.  Curettage may be performed to either diagnose or treat a problem. As a diagnostic procedure, curettage is performed to examine tissues from the uterus. A diagnostic curettage may be performed for the following symptoms:   Irregular bleeding in the uterus.   Bleeding with the development of clots.   Spotting between menstrual periods.   Prolonged menstrual periods.   Bleeding after menopause.   No menstrual  period (amenorrhea).   A change in size and shape of the uterus.  As a treatment procedure, curettage may be performed for the following reasons:   Removal of an IUD (intrauterine device).   Removal of retained placenta after giving birth. Retained placenta can cause an infection or bleeding severe enough to require transfusions.   Abortion.   Miscarriage.   Removal of polyps inside the uterus.   Removal of uncommon types of noncancerous lumps (fibroids).  LET [facility] CARE PROVIDER KNOW ABOUT:   Any allergies you have.   All medicines you are taking, including vitamins, herbs, eye drops, creams, and over-the-counter medicines.   Previous problems you or members of your family have had with the use of anesthetics.  Any blood disorders you have.   Previous surgeries you have had.   Medical conditions you have. RISKS AND COMPLICATIONS  Generally, this is a safe procedure. However, as with any procedure, complications can occur. Possible complications include:  Excessive bleeding.   Infection of the uterus.   Damage to the cervix.   Development of scar tissue (adhesions) inside the uterus, later causing abnormal amounts of menstrual bleeding.   Complications from the general anesthetic, if a general anesthetic is used.   Putting a hole (perforation) in the uterus. This is rare.  BEFORE THE PROCEDURE   Eat and drink before the procedure only as directed by your health care provider.   Arrange for someone to take you home.  PROCEDURE  This procedure usually takes about 15-30 minutes.  You will be given one of the following:  A medicine that numbs the area in and around the cervix (local anesthetic).   A medicine to make you sleep through the procedure (general anesthetic).  You will lie on your back with your legs in stirrups.   A warm metal or plastic instrument (speculum) will be placed in your vagina to keep it open and to allow the  health care provider to see the cervix.  There are two ways in which your cervix can be softened and dilated. These include:   Taking a medicine.   Having thin rods (laminaria) inserted into your cervix.   A curved tool (curette) will be used to scrape cells from the inside lining of the uterus. In some cases, gentle suction is applied with the curette. The curette will then be removed.  AFTER THE PROCEDURE   You will rest in the recovery area until you are stable and are ready to go home.   You may feel sick to your stomach (nauseous) or throw up (vomit) if you were given a general anesthetic.   You may have a sore throat if a tube was placed in your throat during general anesthesia.   You may have light cramping and bleeding. This may last for 2 days to 2 weeks after the procedure.   Your uterus needs to make a new lining after the procedure. This may make your next period late. Document Released: 06/06/2005 Document Revised: 02/06/2013 Document Reviewed: 01/03/2013 [facility] Patient Information 2015 [place], [place]. This information is not intended to replace advice given to you by your health care provider. Make sure you discuss any questions you have with your health care provider. PATIENT INSTRUCTIONS POST-ANESTHESIA  IMMEDIATELY FOLLOWING SURGERY:  Do not drive or operate machinery for the first twenty four hours after surgery.  Do not make any important decisions for twenty four hours after surgery or while taking narcotic pain medications or sedatives.  If you develop intractable nausea and vomiting or a severe headache please notify your doctor immediately.  FOLLOW-UP:  Please make an appointment with your surgeon as instructed. You do not need to follow up with anesthesia unless specifically instructed to do so.  WOUND CARE INSTRUCTIONS (if applicable):  Keep a dry clean dressing on the anesthesia/puncture wound site if there is drainage.  Once the wound has quit  draining you may leave it open to air.  Generally you should leave the bandage intact for twenty four hours unless there is drainage.  If the epidural site drains for more than 36-48 hours please call the anesthesia department.  QUESTIONS?:  Please feel free to call your physician or the hospital operator if you have  any questions, and they will be happy to assist you.

## 2014-08-29 ENCOUNTER — Encounter (HOSPITAL_COMMUNITY): Payer: Self-pay

## 2014-08-29 ENCOUNTER — Other Ambulatory Visit: Payer: Self-pay | Admitting: Obstetrics and Gynecology

## 2014-08-29 ENCOUNTER — Other Ambulatory Visit: Payer: Self-pay

## 2014-08-29 ENCOUNTER — Encounter (HOSPITAL_COMMUNITY)
Admission: RE | Admit: 2014-08-29 | Discharge: 2014-08-29 | Disposition: A | Discharge status: Home / Self Care | Payer: Medicare Other | Source: Ambulatory Visit | Attending: Obstetrics and Gynecology | Admitting: Obstetrics and Gynecology

## 2014-08-29 DIAGNOSIS — Z539 Procedure and treatment not carried out, unspecified reason: Secondary | ICD-10-CM | POA: Diagnosis not present

## 2014-08-29 DIAGNOSIS — N920 Excessive and frequent menstruation with regular cycle: Secondary | ICD-10-CM | POA: Diagnosis not present

## 2014-08-29 DIAGNOSIS — E785 Hyperlipidemia, unspecified: Secondary | ICD-10-CM | POA: Diagnosis not present

## 2014-08-29 DIAGNOSIS — G3184 Mild cognitive impairment, so stated: Secondary | ICD-10-CM | POA: Diagnosis not present

## 2014-08-29 DIAGNOSIS — G10 Huntington's disease: Secondary | ICD-10-CM | POA: Diagnosis not present

## 2014-08-29 DIAGNOSIS — G4730 Sleep apnea, unspecified: Secondary | ICD-10-CM | POA: Diagnosis not present

## 2014-08-29 DIAGNOSIS — Z79899 Other long term (current) drug therapy: Secondary | ICD-10-CM | POA: Diagnosis not present

## 2014-08-29 DIAGNOSIS — I10 Essential (primary) hypertension: Secondary | ICD-10-CM | POA: Diagnosis not present

## 2014-08-29 DIAGNOSIS — E669 Obesity, unspecified: Secondary | ICD-10-CM | POA: Diagnosis not present

## 2014-08-29 DIAGNOSIS — Z01812 Encounter for preprocedural laboratory examination: Secondary | ICD-10-CM | POA: Insufficient documentation

## 2014-08-29 DIAGNOSIS — Z6828 Body mass index (BMI) 28.0-28.9, adult: Secondary | ICD-10-CM | POA: Diagnosis not present

## 2014-08-29 DIAGNOSIS — E119 Type 2 diabetes mellitus without complications: Secondary | ICD-10-CM | POA: Diagnosis not present

## 2014-08-29 HISTORY — DX: Sleep apnea, unspecified: G47.30

## 2014-08-29 LAB — BASIC METABOLIC PANEL
Anion gap: 8 (ref 5–15)
BUN: 14 mg/dL (ref 6–23)
CHLORIDE: 106 mmol/L (ref 96–112)
CO2: 27 mmol/L (ref 19–32)
Calcium: 9.5 mg/dL (ref 8.4–10.5)
Creatinine, Ser: 1.03 mg/dL (ref 0.50–1.10)
GFR calc Af Amer: 76 mL/min — ABNORMAL LOW (ref >90)
GFR calc non Af Amer: 66 mL/min — ABNORMAL LOW (ref >90)
GLUCOSE: 70 mg/dL (ref 70–99)
POTASSIUM: 3.9 mmol/L (ref 3.5–5.1)
Sodium: 141 mmol/L (ref 135–145)

## 2014-08-29 LAB — CBC
HEMATOCRIT: 37.6 % (ref 36.0–46.0)
HEMOGLOBIN: 12 g/dL (ref 12.0–15.0)
MCH: 27.9 pg (ref 26.0–34.0)
MCHC: 31.9 g/dL (ref 30.0–36.0)
MCV: 87.4 fL (ref 78.0–100.0)
Platelets: 261 10*3/uL (ref 150–400)
RBC: 4.3 MIL/uL (ref 3.87–5.11)
RDW: 13.4 % (ref 11.5–15.5)
WBC: 8.1 10*3/uL (ref 4.0–10.5)

## 2014-08-29 LAB — URINE MICROSCOPIC-ADD ON

## 2014-08-29 LAB — URINALYSIS, ROUTINE W REFLEX MICROSCOPIC
Bilirubin Urine: NEGATIVE
GLUCOSE, UA: NEGATIVE mg/dL
LEUKOCYTES UA: NEGATIVE
NITRITE: NEGATIVE
Protein, ur: NEGATIVE mg/dL
Specific Gravity, Urine: 1.025 (ref 1.005–1.030)
Urobilinogen, UA: 1 mg/dL (ref 0.0–1.0)
pH: 7 (ref 5.0–8.0)

## 2014-08-29 LAB — HCG, SERUM, QUALITATIVE: Preg, Serum: NEGATIVE

## 2014-08-29 NOTE — Pre-Procedure Instructions (Signed)
Patients mom given information to sign up for my chart at home.

## 2014-08-29 NOTE — H&P (Signed)
Victoria [name] is an 44 y.o. female. She is admitted for hysteroscopy, dilation and curettage and Novasure Endometrial ablation for heavy menses.  She is mentally handicapped, and controlling menses is difficult for her and requires much assistance from her caregivers.  Pertinent Gynecological History: Menses: flow is excessive with use of both pads or tampons on heaviest days Bleeding:  Contraception: Depo-Provera injections DES exposure: unknown Blood transfusions: none Sexually transmitted diseases: no past history Previous GYN Procedures:   Last mammogram: normal Date: 05/2014 Last pap: normal Date: 2012 OB History: G1, P1   Menstrual History: Menarche age:  No LMP recorded.    Past Medical History  Diagnosis Date  . Hypertension   . Obesity   . Dyslipidemia   . Mild cognitive impairment   . Perennial allergic rhinitis   . Diabetes mellitus   . Anxiety   . Huntington's disease   . Menorrhagia with regular cycle 06/02/2014  . Fibroids 06/10/2014    No past surgical history on file.  Family History  Problem Relation Age of Onset  . Huntington's disease Father   . Cancer      family history   . Diabetes      family history   . Heart defect      family history   . Diabetes Mother   . Hypertension Mother   . Hyperlipidemia Mother   . Huntington's disease Brother   . Alzheimer's disease Maternal Grandmother     Social History:  reports that she has never smoked. She has never used smokeless tobacco. She reports that she does not drink alcohol or use illicit drugs.  Allergies:  Allergies  Allergen Reactions  . Diphenhydramine Hcl Other (See Comments)    unknown     (Not in a hospital admission)  Review of Systems  Constitutional: Negative.   Eyes: Negative.   Respiratory: Negative.   Cardiovascular: Negative.   Gastrointestinal: Negative.   Musculoskeletal: Negative.   Endo/Heme/Allergies: Negative.   Psychiatric/Behavioral: Negative.         Mental handicap    There were no vitals taken for this visit. Physical Exam  Constitutional: She is oriented to person, place, and time. She appears well-developed and well-nourished.  HENT:  Head: Normocephalic and atraumatic.  Eyes: Pupils are equal, round, and reactive to light.  Neck: Normal range of motion. Neck supple.  Cardiovascular: Normal rate.   Respiratory: Effort normal.  GI: Soft.  Genitourinary: Vagina normal and uterus normal.  Endometrial biopsy normal  Musculoskeletal: Normal range of motion.  Neurological: She is alert and oriented to person, place, and time.  Skin: Skin is warm.  Psychiatric: She has a normal mood and affect. Her behavior is normal.  Cooperated with care , even endometrial biopsy    No results found for this or any previous visit (from the past 24 hour(s)). GYNECOLOGIC SONOGRAM   Victoria [name] is a 44 y.o. G1P1001 for a pelvic sonogram for menorrhagia.  Uterus 8.9 x 7.6 x 6.9 cm, anteverted with multiple fibroids noted Largest fibroid=2.8cm  Endometrium 7.3 mm, distorted by multiple fibroids  Right ovary 2.7 x 1.7 x 1.6 cm,   Left ovary 2.4 x 1.6 x 1.4 cm,   No free fluid or adnexal masses noted within the pelvis  Technician Comments:  Anteverted uterus with multiple fibroids noted, ENdom-7.24mm, bilateral ovaries appear WNL, no free fluid or adnexal masses noted within the pelvis   Victoria [name] 06/10/2014 2:16 PM  Clinical Impression and recommendations:  I  have reviewed the sonogram results above.  Combined with the patient's current clinical course, below are my impressions and any appropriate recommendations for management based on the sonographic findings:  1. Mildly enlarged uterus, 200+ grams with endometrium distorted by small uterine fibroids 2. Normal adnexal structures bilaterally  Lashannon Bresnan V No results found.  CBC    Component Value Date/Time    WBC 5.5 05/06/2014 1053   RBC 4.19 05/06/2014 1053   HGB 11.3* 05/06/2014 1053   HGB 11.0* 12/03/2013 1431   HCT 34.9* 05/06/2014 1053   PLT 279 05/06/2014 1053   MCV 83.3 05/06/2014 1053   MCH 27.0 05/06/2014 1053   MCHC 32.4 05/06/2014 1053   RDW 14.2 05/06/2014 1053   LYMPHSABS 3.2 03/15/2013 1349   MONOABS 0.4 03/15/2013 1349   EOSABS 0.1 03/15/2013 1349   BASOSABS 0.0 03/15/2013 1349      Assessment/Plan: Menorrhagia Plan:  Hysteroscopy, dilation and curettage, endometrial ablation.  Avonda Toso V 08/29/2014, 1:22 PM

## 2014-08-30 NOTE — Progress Notes (Signed)
Subjective:    Patient ID: Victoria [name], female    DOB: 15-Oct-1970, 44 y.o.   MRN: [id number]  HPI The PT is here for follow up and re-evaluation of chronic medical conditions, medication management and review of any available recent lab and radiology data.  Preventive health is updated, specifically  Cancer screening and Immunization.   Has upcoming gyne procedure , and will see her neurologist within the next 2 to 3 weeks. Increased difficulty with self care, more concerns about falls, and increased difficulty swallowing have all been noted  , and Mother is requesting additional help with her , wich is reasonable and to be expected, based on the progressive  deterioration in her health which I note. No adverse reaction to  current medications noted .       Review of Systems See HPI. History from her Mother who is her caregiver as she is incapable Denies recent fever or chills. Denies sinus pressure, nasal congestion, ear pain or sore throat. Denies chest congestion, productive cough or wheezing. Denies chest pains, palpitations and leg swelling Denies abdominal pain, nausea, vomiting,diarrhea or constipation.   Denies dysuria, frequency, hesitancy or incontinence. Denies headaches, seizures, numbness, or tingling. . Denies skin break down or rash.        Objective:   Physical Exam BP 108/68 mmHg  Pulse 76  Resp 18  Ht 5\' 2"  (1.575 m)  Wt 158 lb 1.3 oz (71.705 kg)  BMI 28.91 kg/m2  SpO2 96%  LMP 07/21/2014 (Exact Date) Patient alert  and in no cardiopulmonary distress.  HEENT: No facial asymmetry, EOMI,   oropharynx pink and moist.  Neck supple no JVD, no mass.  Chest: Clear to auscultation bilaterally.  CVS: S1, S2 no murmurs, no S3.Regular rate.  ABD: Soft non tender.   Ext: No edema  MS: decreased  ROM spine, shoulders, hips and knees.Unsteady gait due to generalized muscle weakness  Skin: Intact, no ulcerations or rash noted.  Psych: Good eye  contact, flat l affect. Memory unable to accurately assess, due to lack of ability to verbally communicate effectively, however , she obviously has marked memory impairment  CNS: CN 2-12 intact, grade 3 to 4 power throughout with reduced muscle tone.        Assessment & Plan:  Diabetes mellitus without complication Controlled, no change in medication Patient advised to reduce carb and sweets, commit to regular physical activity, take meds as prescribed, test blood as directed, and attempt to lose weight, to improve blood sugar control.    HUNTINGTON'S DISEASE Continues to deteriorated, mother mentions difficulty swallowing increasingly, she has been followed at [facility] for years and has an upcoming appt Will request increased hours of assistance as Victoria [name] is increasingly unable to care for herself   Neurodegenerative cognitive impairment Incrased expressive aphasia and inability to comprehend .   Lack of coordination Limits ability to care for herself and carry out ADL's, needs 24 hour assistance, but is till able to be cared for in her own home   Overweight (BMI 25.0-29.9) Unchanged. Patient re-educated about  the importance of commitment to a  minimum of 150 minutes of exercise per week.  The importance of healthy food choices with portion control discussed. Encouraged to start a food diary, count calories and to consider  joining a support group. Sample diet sheets offered. Goals set by the patient for the next several months.   Wt Readings from Last 3 Encounters:  09/02/14 156 lb (70.761 kg)  08/29/14 156 lb 9.6 oz (71.033 kg)  08/26/14 158 lb 1.3 oz (71.705 kg)    Body mass index is 28.91 kg/(m^2).  Current exercise per week 30 minutes.Markedly limitred due to neurologic illness    Hyperlipidemia with target LDL less than 100 Controlled, no change in medication    Heavy menses Controlled with depo provera, and has gyne procedure planned   ALLERGIC RHINITIS,  SEASONAL Controlled, no change in medication    Dandruff Controlled, no change in medication    Need for vaccination with 13-polyvalent pneumococcal conjugate vaccine After obtaining informed consent, the vaccine is  administered by LPN.

## 2014-08-31 LAB — RPR: RPR: NONREACTIVE

## 2014-09-02 ENCOUNTER — Ambulatory Visit (HOSPITAL_COMMUNITY): Payer: Medicare Other | Admitting: Anesthesiology

## 2014-09-02 ENCOUNTER — Ambulatory Visit (HOSPITAL_COMMUNITY)
Admission: RE | Admit: 2014-09-02 | Discharge: 2014-09-02 | Disposition: A | Discharge status: Home / Self Care | Payer: Medicare Other | Source: Ambulatory Visit | Attending: Obstetrics and Gynecology | Admitting: Obstetrics and Gynecology

## 2014-09-02 ENCOUNTER — Encounter (HOSPITAL_COMMUNITY)
Admission: RE | Disposition: A | Discharge status: Home / Self Care | Payer: Self-pay | Source: Ambulatory Visit | Attending: Obstetrics and Gynecology

## 2014-09-02 ENCOUNTER — Encounter (HOSPITAL_COMMUNITY): Payer: Self-pay | Admitting: *Deleted

## 2014-09-02 DIAGNOSIS — E119 Type 2 diabetes mellitus without complications: Secondary | ICD-10-CM | POA: Insufficient documentation

## 2014-09-02 DIAGNOSIS — N920 Excessive and frequent menstruation with regular cycle: Secondary | ICD-10-CM | POA: Diagnosis not present

## 2014-09-02 DIAGNOSIS — E669 Obesity, unspecified: Secondary | ICD-10-CM | POA: Diagnosis not present

## 2014-09-02 DIAGNOSIS — G3184 Mild cognitive impairment, so stated: Secondary | ICD-10-CM | POA: Diagnosis not present

## 2014-09-02 DIAGNOSIS — N921 Excessive and frequent menstruation with irregular cycle: Secondary | ICD-10-CM | POA: Diagnosis not present

## 2014-09-02 DIAGNOSIS — E785 Hyperlipidemia, unspecified: Secondary | ICD-10-CM | POA: Insufficient documentation

## 2014-09-02 DIAGNOSIS — G10 Huntington's disease: Secondary | ICD-10-CM | POA: Insufficient documentation

## 2014-09-02 DIAGNOSIS — I10 Essential (primary) hypertension: Secondary | ICD-10-CM | POA: Diagnosis not present

## 2014-09-02 DIAGNOSIS — Z79899 Other long term (current) drug therapy: Secondary | ICD-10-CM | POA: Insufficient documentation

## 2014-09-02 DIAGNOSIS — Z539 Procedure and treatment not carried out, unspecified reason: Secondary | ICD-10-CM | POA: Insufficient documentation

## 2014-09-02 DIAGNOSIS — Z6828 Body mass index (BMI) 28.0-28.9, adult: Secondary | ICD-10-CM | POA: Insufficient documentation

## 2014-09-02 DIAGNOSIS — G4730 Sleep apnea, unspecified: Secondary | ICD-10-CM | POA: Insufficient documentation

## 2014-09-02 LAB — GLUCOSE, CAPILLARY: Glucose-Capillary: 94 mg/dL (ref 70–99)

## 2014-09-02 SURGERY — CANCELLED PROCEDURE

## 2014-09-02 MED ORDER — LACTATED RINGERS IV SOLN
INTRAVENOUS | Status: DC
  Administered 2014-09-02: 10:00:00 via INTRAVENOUS

## 2014-09-02 MED ORDER — ONDANSETRON HCL 4 MG/2ML IJ SOLN
4.0000 mg | Freq: Once | INTRAMUSCULAR | Status: AC
  Administered 2014-09-02: 4 mg via INTRAVENOUS

## 2014-09-02 MED ORDER — ONDANSETRON HCL 4 MG/2ML IJ SOLN
INTRAMUSCULAR | Status: AC
  Filled 2014-09-02: qty 2

## 2014-09-02 MED ORDER — MIDAZOLAM HCL 2 MG/2ML IJ SOLN
1.0000 mg | INTRAMUSCULAR | Status: DC | PRN
  Administered 2014-09-02: 2 mg via INTRAVENOUS

## 2014-09-02 MED ORDER — CEFAZOLIN SODIUM-DEXTROSE 2-3 GM-% IV SOLR
INTRAVENOUS | Status: AC
  Filled 2014-09-02: qty 50

## 2014-09-02 MED ORDER — FENTANYL CITRATE 0.05 MG/ML IJ SOLN
INTRAMUSCULAR | Status: AC
  Filled 2014-09-02: qty 2

## 2014-09-02 MED ORDER — FENTANYL CITRATE 0.05 MG/ML IJ SOLN
25.0000 ug | INTRAMUSCULAR | Status: AC
  Administered 2014-09-02: 25 ug via INTRAVENOUS

## 2014-09-02 MED ORDER — MIDAZOLAM HCL 2 MG/2ML IJ SOLN
INTRAMUSCULAR | Status: AC
  Filled 2014-09-02: qty 2

## 2014-09-02 MED ORDER — CEFAZOLIN SODIUM-DEXTROSE 2-3 GM-% IV SOLR: 2.0000 g | INTRAVENOUS | Status: DC

## 2014-09-02 SURGICAL SUPPLY — 25 items
ABLATOR ENDOMETRIAL BIPOLAR (ABLATOR) ×4 IMPLANT
BAG DECANTER FOR FLEXI CONT (MISCELLANEOUS) ×4 IMPLANT
BAG HAMPER (MISCELLANEOUS) ×4 IMPLANT
CATH ROBINSON RED A/P 16FR (CATHETERS) ×4 IMPLANT
CLOTH BEACON ORANGE TIMEOUT ST (SAFETY) ×4 IMPLANT
COVER LIGHT HANDLE STERIS (MISCELLANEOUS) ×8 IMPLANT
FORMALIN 10 PREFIL 120ML (MISCELLANEOUS) ×3 IMPLANT
GLOVE BIOGEL PI IND STRL 9 (GLOVE) ×1 IMPLANT
GLOVE BIOGEL PI INDICATOR 9 (GLOVE) ×2
GLOVE ECLIPSE 9.0 STRL (GLOVE) ×3 IMPLANT
GOWN SPEC L3 XXLG W/TWL (GOWN DISPOSABLE) ×3 IMPLANT
GOWN STRL REUS W/TWL LRG LVL3 (GOWN DISPOSABLE) ×4 IMPLANT
INST SET HYSTEROSCOPY (KITS) ×4 IMPLANT
IV NS 1000ML (IV SOLUTION) ×3
IV NS 1000ML BAXH (IV SOLUTION) ×2 IMPLANT
KIT ROOM TURNOVER AP CYSTO (KITS) ×4 IMPLANT
KIT ROOM TURNOVER APOR (KITS) ×4 IMPLANT
MANIFOLD NEPTUNE II (INSTRUMENTS) ×4 IMPLANT
NS IRRIG 1000ML POUR BTL (IV SOLUTION) ×4 IMPLANT
PACK PERI GYN (CUSTOM PROCEDURE TRAY) ×4 IMPLANT
PAD ARMBOARD 7.5X6 YLW CONV (MISCELLANEOUS) ×4 IMPLANT
PAD TELFA 3X4 1S STER (GAUZE/BANDAGES/DRESSINGS) ×4 IMPLANT
SET BASIN LINEN APH (SET/KITS/TRAYS/PACK) ×4 IMPLANT
SET IRRIG Y TYPE TUR BLADDER L (SET/KITS/TRAYS/PACK) ×4 IMPLANT
SYR CONTROL 10ML LL (SYRINGE) ×4 IMPLANT

## 2014-09-02 NOTE — Telephone Encounter (Signed)
Mother would like to know are there any neurologist that specialize with Huntington's Disease.

## 2014-09-02 NOTE — Interval H&P Note (Signed)
History and Physical Interval Note:  09/02/2014 7:21 AM  [name]  has presented today for surgery, with the diagnosis of menorrhagia  The various methods of treatment have been discussed with the patient and family. After consideration of risks, benefits and other options for treatment, the patient has consented to  Procedure(s): Victoria [name] (N/A) as a surgical intervention .  The patient's history has been reviewed, patient examined, no change in status, stable for surgery.  I have reviewed the patient's chart and labs.  Questions were answered to the patient's satisfaction.     [name]

## 2014-09-02 NOTE — Telephone Encounter (Signed)
r 

## 2014-09-02 NOTE — Discharge Instructions (Signed)
Conscious Sedation, Adult, Care After °Refer to this sheet in the next few weeks. These instructions provide you with information on caring for yourself after your procedure. Your health care provider may also give you more specific instructions. Your treatment has been planned according to current medical practices, but problems sometimes occur. Call your health care provider if you have any problems or questions after your procedure. °WHAT TO EXPECT AFTER THE PROCEDURE  °After your procedure: °· You may feel sleepy, clumsy, and have poor balance for several hours. °· Vomiting may occur if you eat too soon after the procedure. °HOME CARE INSTRUCTIONS °· Do not participate in any activities where you could become injured for at least 24 hours. Do not: °¨ Drive. °¨ Swim. °¨ Ride a bicycle. °¨ Operate heavy machinery. °¨ Cook. °¨ Use power tools. °¨ Climb ladders. °¨ Work from a high place. °· Do not make important decisions or sign legal documents until you are improved. °· If you vomit, drink water, juice, or soup when you can drink without vomiting. Make sure you have little or no nausea before eating solid foods. °· Only take over-the-counter or prescription medicines for pain, discomfort, or fever as directed by your health care provider. °· Make sure you and your family fully understand everything about the medicines given to you, including what side effects may occur. °· You should not drink alcohol, take sleeping pills, or take medicines that cause drowsiness for at least 24 hours. °· If you smoke, do not smoke without supervision. °· If you are feeling better, you may resume normal activities 24 hours after you were sedated. °· Keep all appointments with your health care provider. °SEEK MEDICAL CARE IF: °· Your skin is pale or bluish in color. °· You continue to feel nauseous or vomit. °· Your pain is getting worse and is not helped by medicine. °· You have bleeding or swelling. °· You are still sleepy or  feeling clumsy after 24 hours. °SEEK IMMEDIATE MEDICAL CARE IF: °· You develop a rash. °· You have difficulty breathing. °· You develop any type of allergic problem. °· You have a fever. °MAKE SURE YOU: °· Understand these instructions. °· Will watch your condition. °· Will get help right away if you are not doing well or get worse. °Document Released: 03/27/2013 Document Reviewed: 03/27/2013 °[employer]® Patient Information ©2015 [employer]. This information is not intended to replace advice given to you by your health care provider. Make sure you discuss any questions you have with your health care provider. °  °

## 2014-09-02 NOTE — Anesthesia Preprocedure Evaluation (Addendum)
Anesthesia Evaluation  Patient identified by MRN, date of birth, ID band Patient awake    Reviewed: Allergy & Precautions, NPO status , Patient's Chart, lab work & pertinent test results  Airway Mallampati: III  TM Distance: >3 FB Neck ROM: Full    Dental  (+) Teeth Intact   Pulmonary sleep apnea ,  breath sounds clear to auscultation        Cardiovascular hypertension, Pt. on medications Rhythm:Regular Rate:Normal     Neuro/Psych Anxiety Huntington"s Dx   GI/Hepatic negative GI ROS,   Endo/Other  diabetes, Type 2, Oral Hypoglycemic Agents  Renal/GU      Musculoskeletal   Abdominal   Peds  Hematology   Anesthesia Other Findings   Reproductive/Obstetrics                            Anesthesia Physical Anesthesia Plan  ASA: III  Anesthesia Plan: General   Post-op Pain Management:    Induction: Intravenous and Cricoid pressure planned  Airway Management Planned: Oral ETT  Additional Equipment:   Intra-op Plan:   Post-operative Plan: Extubation in OR  Informed Consent: I have reviewed the patients History and Physical, chart, labs and discussed the procedure including the risks, benefits and alternatives for the proposed anesthesia with the patient or authorized representative who has indicated his/her understanding and acceptance.     Plan Discussed with: Surgeon  Anesthesia Plan Comments: (After discussion with surgeon he has elected to refer case to [facility].)       Anesthesia Quick Evaluation

## 2014-09-02 NOTE — H&P (View-Only) (Signed)
Victoria [name] is an 44 y.o. female. She is admitted for hysteroscopy, dilation and curettage and Novasure Endometrial ablation for heavy menses.  She is mentally handicapped, and controlling menses is difficult for her and requires much assistance from her caregivers.  Pertinent Gynecological History: Menses: flow is excessive with use of both pads or tampons on heaviest days Bleeding:  Contraception: Depo-Provera injections DES exposure: unknown Blood transfusions: none Sexually transmitted diseases: no past history Previous GYN Procedures:   Last mammogram: normal Date: 05/2014 Last pap: normal Date: 2012 OB History: G1, P1   Menstrual History: Menarche age:  No LMP recorded.    Past Medical History  Diagnosis Date  . Hypertension   . Obesity   . Dyslipidemia   . Mild cognitive impairment   . Perennial allergic rhinitis   . Diabetes mellitus   . Anxiety   . Huntington's disease   . Menorrhagia with regular cycle 06/02/2014  . Fibroids 06/10/2014    No past surgical history on file.  Family History  Problem Relation Age of Onset  . Huntington's disease Father   . Cancer      family history   . Diabetes      family history   . Heart defect      family history   . Diabetes Mother   . Hypertension Mother   . Hyperlipidemia Mother   . Huntington's disease Brother   . Alzheimer's disease Maternal Grandmother     Social History:  reports that she has never smoked. She has never used smokeless tobacco. She reports that she does not drink alcohol or use illicit drugs.  Allergies:  Allergies  Allergen Reactions  . Diphenhydramine Hcl Other (See Comments)    unknown     (Not in a hospital admission)  Review of Systems  Constitutional: Negative.   Eyes: Negative.   Respiratory: Negative.   Cardiovascular: Negative.   Gastrointestinal: Negative.   Musculoskeletal: Negative.   Endo/Heme/Allergies: Negative.   Psychiatric/Behavioral: Negative.         Mental handicap    There were no vitals taken for this visit. Physical Exam  Constitutional: She is oriented to person, place, and time. She appears well-developed and well-nourished.  HENT:  Head: Normocephalic and atraumatic.  Eyes: Pupils are equal, round, and reactive to light.  Neck: Normal range of motion. Neck supple.  Cardiovascular: Normal rate.   Respiratory: Effort normal.  GI: Soft.  Genitourinary: Vagina normal and uterus normal.  Endometrial biopsy normal  Musculoskeletal: Normal range of motion.  Neurological: She is alert and oriented to person, place, and time.  Skin: Skin is warm.  Psychiatric: She has a normal mood and affect. Her behavior is normal.  Cooperated with care , even endometrial biopsy    No results found for this or any previous visit (from the past 24 hour(s)). GYNECOLOGIC SONOGRAM   Victoria [name] is a 44 y.o. G1P1001 for a pelvic sonogram for menorrhagia.  Uterus 8.9 x 7.6 x 6.9 cm, anteverted with multiple fibroids noted Largest fibroid=2.8cm  Endometrium 7.3 mm, distorted by multiple fibroids  Right ovary 2.7 x 1.7 x 1.6 cm,   Left ovary 2.4 x 1.6 x 1.4 cm,   No free fluid or adnexal masses noted within the pelvis  Technician Comments:  Anteverted uterus with multiple fibroids noted, ENdom-7.25mm, bilateral ovaries appear WNL, no free fluid or adnexal masses noted within the pelvis   Lazarus Gowda 06/10/2014 2:16 PM  Clinical Impression and recommendations:  I  have reviewed the sonogram results above.  Combined with the patient's current clinical course, below are my impressions and any appropriate recommendations for management based on the sonographic findings:  1. Mildly enlarged uterus, 200+ grams with endometrium distorted by small uterine fibroids 2. Normal adnexal structures bilaterally  Victoria [name] No results found.  CBC    Component Value Date/Time    WBC 5.5 05/06/2014 1053   RBC 4.19 05/06/2014 1053   HGB 11.3* 05/06/2014 1053   HGB 11.0* 12/03/2013 1431   HCT 34.9* 05/06/2014 1053   PLT 279 05/06/2014 1053   MCV 83.3 05/06/2014 1053   MCH 27.0 05/06/2014 1053   MCHC 32.4 05/06/2014 1053   RDW 14.2 05/06/2014 1053   LYMPHSABS 3.2 03/15/2013 1349   MONOABS 0.4 03/15/2013 1349   EOSABS 0.1 03/15/2013 1349   BASOSABS 0.0 03/15/2013 1349      Assessment/Plan: Menorrhagia Plan:  Hysteroscopy, dilation and curettage, endometrial ablation.  Victoria [name] 08/29/2014, 1:22 PM

## 2014-09-02 NOTE — Progress Notes (Signed)
Awake. Answers questions appropriately according to mother. Dressed with assistance from mother. Standing. Gait steady. D/C to home from pre-op, ambulatory in good condition.

## 2014-09-02 NOTE — Interval H&P Note (Signed)
History and Physical Interval Note:  09/02/2014 7:23 AM  [name]  has presented today for surgery, with the diagnosis of menorrhagia  The various methods of treatment have been discussed with the patient and family. After consideration of risks, benefits and other options for treatment, the patient has consented to  Procedure(s): [place] (N/A) as a surgical intervention .  The patient's history has been reviewed, patient examined, no change in status, stable for surgery.  I have reviewed the patient's chart and labs.  Questions were answered to the patient's satisfaction.     [name]

## 2014-09-04 NOTE — Telephone Encounter (Signed)
If   her main concern is for finding  Provider nearer, I will enter a  a referral  To neurologist in [place]  Let me know. I am currently not aware of any particular one that specializes in Huntingtons in [place] I do believe that the ones available will be able to care for Demeisha  As far as swallowing problems are concerned,  I would offer swallowing evaluation, and possible GI referral.  Pls get back to me on this, tell Mom , sorry for the delay!

## 2014-09-07 DIAGNOSIS — Z23 Encounter for immunization: Secondary | ICD-10-CM | POA: Insufficient documentation

## 2014-09-07 DIAGNOSIS — L210 Seborrhea capitis: Secondary | ICD-10-CM | POA: Insufficient documentation

## 2014-09-07 NOTE — Assessment & Plan Note (Signed)
Incrased expressive aphasia and inability to comprehend .

## 2014-09-07 NOTE — Assessment & Plan Note (Signed)
After obtaining informed consent, the vaccine is  administered by LPN.  

## 2014-09-07 NOTE — Assessment & Plan Note (Signed)
Controlled with depo provera, and has gyne procedure planned

## 2014-09-07 NOTE — Assessment & Plan Note (Signed)
Controlled, no change in medication  

## 2014-09-07 NOTE — Assessment & Plan Note (Signed)
Limits ability to care for herself and carry out ADL's, needs 24 hour assistance, but is till able to be cared for in her own home

## 2014-09-07 NOTE — Assessment & Plan Note (Signed)
Unchanged. Patient re-educated about  the importance of commitment to a  minimum of 150 minutes of exercise per week.  The importance of healthy food choices with portion control discussed. Encouraged to start a food diary, count calories and to consider  joining a support group. Sample diet sheets offered. Goals set by the patient for the next several months.   Wt Readings from Last 3 Encounters:  09/02/14 156 lb (70.761 kg)  08/29/14 156 lb 9.6 oz (71.033 kg)  08/26/14 158 lb 1.3 oz (71.705 kg)    Body mass index is 28.91 kg/(m^2).  Current exercise per week 30 minutes.Markedly limitred due to neurologic illness

## 2014-09-07 NOTE — Assessment & Plan Note (Signed)
Continues to deteriorated, mother mentions difficulty swallowing increasingly, she has been followed at [facility] for years and has an upcoming appt Will request increased hours of assistance as Victoria [name] is increasingly unable to care for herself

## 2014-09-07 NOTE — Assessment & Plan Note (Signed)
Controlled, no change in medication Patient advised to reduce carb and sweets, commit to regular physical activity, take meds as prescribed, test blood as directed, and attempt to lose weight, to improve blood sugar control.  

## 2014-09-08 ENCOUNTER — Telehealth: Payer: Self-pay | Admitting: Family Medicine

## 2014-09-08 ENCOUNTER — Telehealth: Payer: Self-pay | Admitting: *Deleted

## 2014-09-08 MED ORDER — GLUCOSE BLOOD VI STRP
ORAL_STRIP | Status: DC
Start: 2014-09-08 — End: 2015-08-13

## 2014-09-08 NOTE — Telephone Encounter (Signed)
strips sent in

## 2014-09-09 NOTE — Telephone Encounter (Signed)
Dr Royce Macadamia of [facility] will review the chart.  pPt may be a candidate for Spinal Anesth.

## 2014-09-10 ENCOUNTER — Encounter: Payer: Medicare Other | Admitting: Obstetrics and Gynecology

## 2014-09-24 NOTE — Telephone Encounter (Signed)
Spoke with mother and she states that she was not seeing a neurologist in [place] and was just being given medication to help with symptoms.  She would prefer to come closer to home for neurology.  She also agrees with referral to GI for swallowing problems.  Referrals entered.

## 2014-09-24 NOTE — Addendum Note (Signed)
Addended by: Denman George B on: 09/24/2014 09:57 AM   Modules accepted: Orders

## 2014-09-25 ENCOUNTER — Encounter: Payer: Self-pay | Admitting: Gastroenterology

## 2014-10-14 ENCOUNTER — Ambulatory Visit (INDEPENDENT_AMBULATORY_CARE_PROVIDER_SITE_OTHER): Payer: Medicare Other | Admitting: Nurse Practitioner

## 2014-10-14 ENCOUNTER — Encounter: Payer: Self-pay | Admitting: Nurse Practitioner

## 2014-10-14 ENCOUNTER — Other Ambulatory Visit: Payer: Self-pay

## 2014-10-14 DIAGNOSIS — R1314 Dysphagia, pharyngoesophageal phase: Secondary | ICD-10-CM

## 2014-10-14 DIAGNOSIS — R1310 Dysphagia, unspecified: Secondary | ICD-10-CM | POA: Insufficient documentation

## 2014-10-14 NOTE — Progress Notes (Signed)
Primary Care Physician:  Tula Nakayama, MD Primary Gastroenterologist:  Dr. Oneida Alar  Chief Complaint  Patient presents with  . Dysphagia    HPI:   44 year old female with a complex medical history including decompensating Huntington's disease, mild cognitive impairment, diabetes, and others presents on referral from primary care provider for evaluation of increasing swallowing difficulties. Today she is accompanied by her aunt and mother. Her family states she's been having dysphagia symptoms for about over a year and is progressive. At this point she is unable to eat alone. She has a tendency to have uncoordinated chewing, will tend to swallow large pieces of food without chewing. They have been chopping her foods. Also with issues with pills and liquids. Has had occasional instances of possible aspiration noted with increased coffee and running nose. Has had a couple episodes of food regurgitation lately as well. The patient will also sometimes rub her chest after eating but not as able to verbalize the problem. Was seen by a speech therapist about 2-3 years ago, but symptoms were not an issue then. Denies hematochezia, melena, and hematemesis. Is taking Miralax which helps her have regular bowel movements. No other upper and lower GI symptoms noted.   PCP notes reviewed.  Past Medical History  Diagnosis Date  . Hypertension   . Obesity   . Dyslipidemia   . Mild cognitive impairment   . Perennial allergic rhinitis   . Diabetes mellitus   . Anxiety   . Huntington's disease   . Menorrhagia with regular cycle 06/02/2014  . Fibroids 06/10/2014  . Sleep apnea     cannot tolerate CPAP.    No past surgical history on file.  Current Outpatient Prescriptions  Medication Sig Dispense Refill  . acetaminophen (TYLENOL) 500 MG tablet Take 500 mg by mouth daily as needed for mild pain. As needed    . Blood Glucose Monitoring Suppl (PRODIGY PREFERRED MONITOR) W/DEVICE KIT by Does not  apply route daily.      . cetirizine (ZYRTEC) 10 MG tablet TAKE ONE (1) TABLET EACH DAY 30 tablet 3  . citalopram (CELEXA) 20 MG tablet Take 20 mg by mouth daily.      . Clobetasol Propionate (CLOBEX) 0.05 % shampoo Apply topically twice daily 118 mL 4  . ferrous sulfate 324 (65 FE) MG TBEC Take 1 tablet by mouth daily.     Marland Kitchen galantamine (RAZADYNE ER) 24 MG 24 hr capsule Take 24 mg by mouth daily.      Marland Kitchen glucose blood test strip Use as instructed once daily testing. Dx E11.9 50 each 5  . Lancets MISC 1 each by Does not apply route once. Once daily, testing dx 250.00 100 each 2  . lovastatin (MEVACOR) 20 MG tablet TAKE ONE TABLET DAILY AT BEDTIME 30 tablet 2  . medroxyPROGESTERone (DEPO-PROVERA) 150 MG/ML injection Inject 1 mL (150 mg total) into the muscle every 3 (three) months. 1 mL 3  . megestrol (MEGACE) 40 MG tablet Take 1 tablet (40 mg total) by mouth daily. On days of spotting. If spotting continues on once daily ,can increase to bid. 60 tablet 2  . memantine (NAMENDA) 10 MG tablet Take 10 mg by mouth 2 (two) times daily.      . metFORMIN (GLUCOPHAGE) 500 MG tablet TAKE ONE TABLET EACH DAY WITH BREAKFAST 30 tablet 3  . Multiple Vitamin (MULTIVITAMIN PO) Take by mouth daily.      . Omega-3 Fatty Acids (FISH OIL) 1000 MG CAPS Take by  mouth 2 (two) times daily.      . ramipril (ALTACE) 2.5 MG capsule TAKE ONE (1) CAPSULE EACH DAY 90 capsule 1  . traZODone (DESYREL) 50 MG tablet Take 50 mg by mouth at bedtime.    . [DISCONTINUED] QUEtiapine (SEROQUEL) 50 MG tablet Take 50 mg by mouth at bedtime.       No current facility-administered medications for this visit.    Allergies as of 10/14/2014 - Review Complete 10/14/2014  Allergen Reaction Noted  . Diphenhydramine hcl Other (See Comments) 07/19/2007    Family History  Problem Relation Age of Onset  . Huntington's disease Father   . Cancer      family history   . Diabetes      family history   . Heart defect      family history     . Diabetes Mother   . Hypertension Mother   . Hyperlipidemia Mother   . Huntington's disease Brother   . Alzheimer's disease Maternal Grandmother     History   Social History  . Marital Status: Divorced    Spouse Name: N/A  . Number of Children: N/A  . Years of Education: N/A   Occupational History  . disabled     Social History Main Topics  . Smoking status: Never Smoker   . Smokeless tobacco: Never Used  . Alcohol Use: No  . Drug Use: No  . Sexual Activity: No   Other Topics Concern  . Not on file   Social History Narrative    Review of Systems: 10 point ROS negative except as per HPI.     Physical Exam: BP 105/74 mmHg  Pulse 80  Temp(Src) 98.8 F (37.1 C)  Ht $R'5\' 2"'Zx$  (1.575 m)  Wt 156 lb (70.761 kg)  BMI 28.53 kg/m2  LMP 10/07/2014 General:   Alert. Pleasant and cooperative. Well-nourished and well-developed.  Head:  Normocephalic and atraumatic. Eyes:  Without icterus, sclera clear and conjunctiva pink.  Ears:  Normal auditory acuity. Mouth:  No deformity or lesions, oral mucosa pink. No OP edema. Neck:  Supple, without mass or thyromegaly. Lungs:  Clear to auscultation bilaterally. No wheezes, rales, or rhonchi. No distress.  Heart:  S1, S2 present without murmurs appreciated.  Abdomen:  +BS, soft, non-tender and non-distended. No HSM noted. No guarding or rebound. No masses appreciated.  Rectal:  Deferred  Extremities:  Without clubbing or edema. Neurologic:  Alert and  oriented x4;  grossly normal neurologically. Skin:  Intact without significant lesions or rashes. Psych:  Alert and cooperative. Minimally verbal but can respond to yes/no questions. Normal mood and affect.     10/14/2014 10:30 AM

## 2014-10-14 NOTE — Patient Instructions (Signed)
1. We will schedule your x-ray pictures to evaluate her swallowing ability. 2. Return for a follow-up appointment in 4 weeks and we can review the results and decide where we need to go from here.

## 2014-10-17 ENCOUNTER — Ambulatory Visit (HOSPITAL_COMMUNITY)
Admission: RE | Admit: 2014-10-17 | Discharge: 2014-10-17 | Disposition: A | Discharge status: Home / Self Care | Payer: Medicare Other | Source: Ambulatory Visit | Attending: Nurse Practitioner | Admitting: Nurse Practitioner

## 2014-10-17 DIAGNOSIS — G10 Huntington's disease: Secondary | ICD-10-CM | POA: Insufficient documentation

## 2014-10-17 DIAGNOSIS — R1310 Dysphagia, unspecified: Secondary | ICD-10-CM | POA: Insufficient documentation

## 2014-10-17 DIAGNOSIS — R1314 Dysphagia, pharyngoesophageal phase: Secondary | ICD-10-CM

## 2014-10-20 ENCOUNTER — Other Ambulatory Visit: Payer: Self-pay | Admitting: Family Medicine

## 2014-10-21 ENCOUNTER — Other Ambulatory Visit: Payer: Self-pay

## 2014-10-23 NOTE — Assessment & Plan Note (Signed)
44 year old complicated patient with a history of Huntington's disease. Patient is minimally verbal. Has been sent for evaluation to [place] for dysphagia symptoms. Based on the description given by the patient's family there seems to be a least a large component of her issues with chewing and swallowing coordination which is likely due to her progressive neurological condition. However they do note that she has occasionally had some regurgitation and complain of esophageal pain. There is a possibility of an esophageal stricture, web, ring that is contributing to her symptoms. This patient is a very complicated case and likely would not tolerate conscious sedation and the family is not wanting to pursue a conscious sedation procedure at this time. I feel the best thing to do at this point is to order a barium pill esophagram to evaluate for esophageal dysmotility or other pathology contributing to her symptoms from a GI standpoint. If this is normal it is likely essentially a neurological issue and the patient should be referred to neurology and/or speech therapy for further evaluation and management. Return to clinic for evaluation in 4 weeks to review results and make plans for the next steps.

## 2014-10-27 ENCOUNTER — Ambulatory Visit (INDEPENDENT_AMBULATORY_CARE_PROVIDER_SITE_OTHER): Payer: Medicare Other | Admitting: Diagnostic Neuroimaging

## 2014-10-27 ENCOUNTER — Encounter: Payer: Self-pay | Admitting: Diagnostic Neuroimaging

## 2014-10-27 VITALS — BP 110/78 | HR 90 | Ht 62.0 in | Wt 156.4 lb

## 2014-10-27 DIAGNOSIS — F0280 Dementia in other diseases classified elsewhere without behavioral disturbance: Secondary | ICD-10-CM | POA: Diagnosis not present

## 2014-10-27 DIAGNOSIS — G10 Huntington's disease: Secondary | ICD-10-CM | POA: Diagnosis not present

## 2014-10-27 NOTE — Patient Instructions (Signed)
Continue current medications. 

## 2014-10-27 NOTE — Progress Notes (Signed)
[facility]  PATIENT: Victoria [name] DOB: 04-12-1971  REFERRING CLINICIAN: Bari Mantis HISTORY FROM: patient, mother, maternal aunt REASON FOR VISIT: new consult    HISTORICAL  CHIEF COMPLAINT:  Chief Complaint  Patient presents with  . New Evaluation    HISTORY OF PRESENT ILLNESS:   44 year old female with Huntington's disease, here for evaluation. Patient referred by PCP for consultation. 2005 patient developed onset of memory loss, tremors, getting lost while driving. At that time patient's father had already been diagnosed with Huntington's disease. Patient was evaluated by local neurologist in [facility], then referred to [facility] for further evaluation. Genetic testing confirmed Huntington's disease. Patient then transitioned to neuropsychology/psychiatry clinic in [facility] for management of mood lability, anxiety, confusion, agitation. The symptoms have been stable for the past one year. Patient has not followed with neurology since 2007.  Now patient having progressive slurred speech, trouble swallowing, trouble walking. She requires almost 24-hour supervision. She needs help with most activities of daily living including dressing, bathing, eating. Her care is provided mainly by her mother and maternal aunt. They have applied for home health aids through Medicare but are waiting for assistance to be approved.   REVIEW OF SYSTEMS: Full 14 system review of systems performed and notable only for restless leg snoring sleepiness memory loss confusion slurred speech and swallowing runny nose constipation trouble swallowing.  ALLERGIES: Allergies  Allergen Reactions  . Diphenhydramine Hcl Other (See Comments)    unknown    HOME MEDICATIONS: Outpatient Prescriptions Prior to Visit  Medication Sig Dispense Refill  . acetaminophen (TYLENOL) 500 MG tablet Take 500 mg by mouth daily as needed for mild pain. As needed    .  Blood Glucose Monitoring Suppl (PRODIGY PREFERRED MONITOR) W/DEVICE KIT by Does not apply route daily.      . cetirizine (ZYRTEC) 10 MG tablet TAKE ONE (1) TABLET EACH DAY 30 tablet 6  . citalopram (CELEXA) 20 MG tablet Take 20 mg by mouth daily.      . Clobetasol Propionate (CLOBEX) 0.05 % shampoo Apply topically twice daily 118 mL 4  . ferrous sulfate 324 (65 FE) MG TBEC Take 1 tablet by mouth daily.     Marland Kitchen galantamine (RAZADYNE ER) 24 MG 24 hr capsule Take 24 mg by mouth daily.      Marland Kitchen glucose blood test strip Use as instructed once daily testing. Dx E11.9 50 each 5  . Lancets MISC 1 each by Does not apply route once. Once daily, testing dx 250.00 100 each 2  . lovastatin (MEVACOR) 20 MG tablet TAKE ONE TABLET DAILY AT BEDTIME 30 tablet 2  . medroxyPROGESTERone (DEPO-PROVERA) 150 MG/ML injection Inject 1 mL (150 mg total) into the muscle every 3 (three) months. 1 mL 3  . megestrol (MEGACE) 40 MG tablet Take 1 tablet (40 mg total) by mouth daily. On days of spotting. If spotting continues on once daily ,can increase to bid. 60 tablet 2  . memantine (NAMENDA) 10 MG tablet Take 10 mg by mouth 2 (two) times daily.      . metFORMIN (GLUCOPHAGE) 500 MG tablet TAKE ONE (1) TABLET BY MOUTH EVERY DAY WITH BREAKFAST 30 tablet 3  . Multiple Vitamin (MULTIVITAMIN PO) Take by mouth daily.      . Omega-3 Fatty Acids (FISH OIL) 1000 MG CAPS Take by mouth 2 (two) times daily.      . ramipril (ALTACE) 2.5 MG capsule TAKE ONE (1) CAPSULE EACH DAY 90  capsule 1  . traZODone (DESYREL) 50 MG tablet Take 50 mg by mouth at bedtime.     No facility-administered medications prior to visit.    PAST MEDICAL HISTORY: Past Medical History  Diagnosis Date  . Hypertension   . Obesity   . Dyslipidemia   . Mild cognitive impairment   . Perennial allergic rhinitis   . Diabetes mellitus   . Anxiety   . Huntington's disease   . Menorrhagia with regular cycle 06/02/2014  . Fibroids 06/10/2014  . Sleep apnea     cannot  tolerate CPAP.    PAST SURGICAL HISTORY: History reviewed. No pertinent past surgical history.  FAMILY HISTORY: Family History  Problem Relation Age of Onset  . Huntington's disease Father   . Cancer      family history   . Diabetes      family history   . Heart defect      family history   . Diabetes Mother   . Hypertension Mother   . Hyperlipidemia Mother   . Huntington's disease Brother   . Alzheimer's disease Maternal Grandmother     SOCIAL HISTORY:  History   Social History  . Marital Status: Divorced    Spouse Name: N/A  . Number of Children: 1  . Years of Education: N/A   Occupational History  . disabled     Social History Main Topics  . Smoking status: Never Smoker   . Smokeless tobacco: Never Used  . Alcohol Use: No  . Drug Use: No  . Sexual Activity: No   Other Topics Concern  . Not on file   Social History Narrative   Lives with mom   Drinks 1 cup of coffee a day     PHYSICAL EXAM  Filed Vitals:   10/27/14 1122  BP: 110/78  Pulse: 90  Height: 5' 2" (1.575 m)  Weight: 156 lb 6.4 oz (70.943 kg)    Body mass index is 28.6 kg/(m^2).   Visual Acuity Screening   Right eye Left eye Both eyes  Without correction: unable unable   With correction:       No flowsheet data found.  GENERAL EXAM: Patient is in no distress; well developed, nourished and groomed; neck is supple  CARDIOVASCULAR: Regular rate and rhythm, no murmurs, no carotid bruits  NEUROLOGIC: MENTAL STATUS: awake, alert, oriented to person, NOT PLACE OR TIME. PAUCITY OF SPEECH. MEMORY IMPAIRED. FOLLOWS SIMPLE COMMANDS. MASKED FACIES CRANIAL NERVE: no papilledema on fundoscopic exam, pupils equal and reactive to light, visual fields full to confrontation, extraocular muscles intact, no nystagmus, facial sensation and strength symmetric, hearing intact, palate elevates symmetrically, uvula midline, shoulder shrug symmetric, tongue midline. MOD DYSARTHRIA MOTOR: normal bulk;  INCR TONE AND PARATONIA IN BUE AND BLE; INTERMITTENT CHOREA (UPPER BODY) SENSORY: normal and symmetric to light touch COORDINATION: finger-nose-finger SLOW  REFLEXES: deep tendon reflexes present and symmetric GAIT/STATION: narrow based gait; STOOPED POSTURE; ARMS FLEXED    DIAGNOSTIC DATA (LABS, IMAGING, TESTING) - I reviewed patient records, labs, notes, testing and imaging myself where available.  Lab Results  Component Value Date   WBC 8.1 08/29/2014   HGB 12.0 08/29/2014   HCT 37.6 08/29/2014   MCV 87.4 08/29/2014   PLT 261 08/29/2014      Component Value Date/Time   NA 141 08/29/2014 1500   K 3.9 08/29/2014 1500   CL 106 08/29/2014 1500   CO2 27 08/29/2014 1500   GLUCOSE 70 08/29/2014 1500   BUN 14  08/29/2014 1500   CREATININE 1.03 08/29/2014 1500   CREATININE 1.01 08/25/2014 1224   CALCIUM 9.5 08/29/2014 1500   PROT 6.9 08/25/2014 1224   ALBUMIN 4.1 08/25/2014 1224   AST 11 08/25/2014 1224   ALT 8 08/25/2014 1224   ALKPHOS 46 08/25/2014 1224   BILITOT 0.5 08/25/2014 1224   GFRNONAA 66* 08/29/2014 1500   GFRNONAA 68 08/25/2014 1224   GFRAA 76* 08/29/2014 1500   GFRAA 79 08/25/2014 1224   Lab Results  Component Value Date   CHOL 151 08/25/2014   HDL 34* 08/25/2014   LDLCALC 99 08/25/2014   TRIG 90 08/25/2014   CHOLHDL 4.4 08/25/2014   Lab Results  Component Value Date   HGBA1C 5.9* 08/25/2014   Lab Results  Component Value Date   VITAMINB12 243 09/14/2009   Lab Results  Component Value Date   TSH 1.962 05/06/2014    12/08/06 MRI brain [I reviewed images myself and agree with interpretation. -VRP]  1. No evidence of acute ischemia. 2. Mild diffuse atrophy for the patient's age.  3. 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  4. Prominence of the prevertebral soft tissues at the level of C2-3, measuring 9 mm. The significance of this is unclear. However, with the findings from the  dens, it may be helpful to consider getting a CT scan of the cervical spine with special attention to mid dens as described to follow up for possible abnormality such as a fracture.  5. Mild sinus disease in the ethmoids, the frontal, and the maxillary sinuses. 6. Suggest a follow-up MRI scan to evaluate for stability of the cystic entity in the pineal region.    ASSESSMENT AND PLAN  44 y.o. year old female here with genetic testing confirmed Huntington's disease, with family history positive in paternal side (father, paternal aunts and uncles, paternal half-brother). Patient has one son age 44 years old who is not been tested yet. Patient having progressive decline in activities of daily living specifically related to eating and swallowing. This is likely due to neurodegenerative progression of disease. Supportive care advised with supervision, small bites, purees and verbal reminders / encouragement.  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  I spent 45 minutes of face to face time with patient. Greater than 50% of time was spent in counseling and coordination of care with patient.  Penni Bombard, MD 7/0/4888, 91:69 PM Certified in Neurology, Neurophysiology and Neuroimaging  [facility] [address], [place], [place] [address] [phone]

## 2014-10-29 NOTE — Progress Notes (Signed)
cc'ed to pcp °

## 2014-11-10 ENCOUNTER — Telehealth: Payer: Self-pay

## 2014-11-10 NOTE — Telephone Encounter (Signed)
Pts mom Mardene Celeste called and wants to know if pt needs to come in. Pt had swallowing test and the results should have been sent to Dr. Moshe Cipro. Mother wants to know why she needs to be seen by our office again.  States she already discussed issues and plan on pt last visit

## 2014-11-11 ENCOUNTER — Encounter: Payer: Self-pay | Admitting: *Deleted

## 2014-11-11 ENCOUNTER — Ambulatory Visit (INDEPENDENT_AMBULATORY_CARE_PROVIDER_SITE_OTHER): Payer: Medicare Other | Admitting: *Deleted

## 2014-11-11 DIAGNOSIS — Z3202 Encounter for pregnancy test, result negative: Secondary | ICD-10-CM

## 2014-11-11 DIAGNOSIS — Z3042 Encounter for surveillance of injectable contraceptive: Secondary | ICD-10-CM

## 2014-11-11 LAB — POCT URINE PREGNANCY: Preg Test, Ur: NEGATIVE

## 2014-11-11 MED ORDER — MEDROXYPROGESTERONE ACETATE 150 MG/ML IM SUSP
150.0000 mg | Freq: Once | INTRAMUSCULAR | Status: AC
  Administered 2014-11-11: 150 mg via INTRAMUSCULAR

## 2014-11-11 NOTE — Progress Notes (Signed)
Pt here for Depo. Pt had light spotting after last shot x 2 weeks. Advised if pt has spotting after this shot, call office. Family voiced understanding. Return in 12 weeks for next shot. [place]

## 2014-11-13 ENCOUNTER — Other Ambulatory Visit: Payer: Self-pay | Admitting: Family Medicine

## 2014-11-13 ENCOUNTER — Ambulatory Visit: Payer: Medicare Other | Admitting: Nurse Practitioner

## 2014-11-13 ENCOUNTER — Telehealth: Payer: Self-pay | Admitting: Nurse Practitioner

## 2014-11-13 NOTE — Telephone Encounter (Signed)
Pt was a no show

## 2014-11-14 NOTE — Telephone Encounter (Signed)
Noted  

## 2014-11-14 NOTE — Telephone Encounter (Signed)
She does not need to come in if she doesn't want to. Dr. Moshe Cipro should have access to diagnostics.

## 2014-11-20 DIAGNOSIS — F0281 Dementia in other diseases classified elsewhere with behavioral disturbance: Secondary | ICD-10-CM | POA: Diagnosis not present

## 2014-11-20 DIAGNOSIS — G10 Huntington's disease: Secondary | ICD-10-CM | POA: Diagnosis not present

## 2015-01-23 DIAGNOSIS — E119 Type 2 diabetes mellitus without complications: Secondary | ICD-10-CM | POA: Diagnosis not present

## 2015-01-23 LAB — HEMOGLOBIN A1C
Hgb A1c MFr Bld: 6.1 % — ABNORMAL HIGH (ref <5.7)
Mean Plasma Glucose: 128 mg/dL — ABNORMAL HIGH (ref <117)

## 2015-01-27 ENCOUNTER — Ambulatory Visit (INDEPENDENT_AMBULATORY_CARE_PROVIDER_SITE_OTHER): Payer: Medicare Other | Admitting: Family Medicine

## 2015-01-27 ENCOUNTER — Encounter: Payer: Self-pay | Admitting: Family Medicine

## 2015-01-27 VITALS — BP 114/80 | HR 78 | Resp 16 | Ht 62.0 in | Wt 153.0 lb

## 2015-01-27 DIAGNOSIS — F0280 Dementia in other diseases classified elsewhere without behavioral disturbance: Secondary | ICD-10-CM

## 2015-01-27 DIAGNOSIS — G10 Huntington's disease: Secondary | ICD-10-CM

## 2015-01-27 DIAGNOSIS — G319 Degenerative disease of nervous system, unspecified: Secondary | ICD-10-CM

## 2015-01-27 DIAGNOSIS — R279 Unspecified lack of coordination: Secondary | ICD-10-CM | POA: Diagnosis not present

## 2015-01-27 DIAGNOSIS — E119 Type 2 diabetes mellitus without complications: Secondary | ICD-10-CM

## 2015-01-27 DIAGNOSIS — E785 Hyperlipidemia, unspecified: Secondary | ICD-10-CM

## 2015-01-27 DIAGNOSIS — W19XXXD Unspecified fall, subsequent encounter: Secondary | ICD-10-CM

## 2015-01-27 NOTE — Progress Notes (Signed)
Subjective:    Patient ID: Victoria [name], female    DOB: 03-29-1971, 44 y.o.   MRN: [id number]  HPI Repeated falls on avg once weekly, poor gait, needs max hrs  Currently 3 hrs per day  Needs light weight wheelchair Choking on food , needs swallow eval  Not chewing and choking , food through the nose  Overall her neurologic illness is worsening and Mother is seeking increased and needed help, andl] also is requesting that she be followed locally, as there is no treatment   Review of Systems  History by Mother's observation, pt incapable, she has expressive aphasia See HPI Denies recent fever or chills. Denies , nasal congestion, Denies chest congestion, productive cough or wheezing. Denies PND, orthopnea and leg swelling Denies abdominal pain, nausea, vomiting,diarrhea or constipation.   Denies joint pain, swelling significant  limitation in mobility.  Denies skin break down or rash.        Objective:   Physical Exam BP 114/80 mmHg  Pulse 78  Resp 16  Ht 5\' 2"  (1.575 m)  Wt 153 lb (69.4 kg)  BMI 27.98 kg/m2  SpO2 100% Patient alert  and in no cardiopulmonary distress. No visible pain. Aphasic.  HEENT: No facial asymmetry, EOMI,   oropharynx pink and moist.  Neck supple no JVD, no mass.  Chest: Clear to auscultation bilaterally.  CVS: S1, S2 no murmurs, no S3.Regular rate.  ABD: Soft non tender.   Ext: No edema  MS: Adequate though reduced  ROM spine, shoulders, hips and knees.  Skin: Intact, no ulcerations or rash noted.  Psych: Good eye contact, flat affect. Unable to assess memory not anxious or depressed appearing.  CNS: CN 2-12 intact, grade 5  power,  Decreased tone, normal sensation      Assessment & Plan:  Lack of coordination Recurrent falls and increased need of personal assitance due to neurologic illness, apply for increased CAP hours, form completed  Falls Home safety reviewed with Mother and another caregiver. 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 Goodridge is reminded of the importance of commitment to daily physical activity for 30 minutes or more, as able and the need to limit carbohydrate intake to 30 to 60 grams per meal to help with blood sugar control.   The need to take medication as prescribed, test blood sugar as directed, and to call between visits if there is a concern that blood sugar is uncontrolled is also discussed.   Ms. Borromeo is reminded of the importance of daily foot exam, annual eye examination, and good blood sugar, blood pressure and cholesterol control.  Diabetic Labs Latest Ref Rng 01/23/2015 08/29/2014 08/25/2014 05/06/2014 11/21/2013  HbA1c <5.7 % 6.1(H) - 5.9(H) 6.1(H) 6.0(H)  Microalbumin <2.0 mg/dL - - - 2.5(H) -  Micro/Creat Ratio 0.0 - 30.0 mg/g - - - 9.5 -  Chol 0 - 200 mg/dL - - 151 171 141  HDL >=46 mg/dL - - 34(L) 37(L) 36(L)  Calc LDL 0 - 99 mg/dL - - 99 113(H) 90  Triglycerides <150 mg/dL - - 90 104 74  Creatinine 0.50 - 1.10 mg/dL - 1.03 1.01 1.06  0.93   BP/Weight 01/27/2015 10/27/2014 10/14/2014 09/02/2014 08/29/2014 08/26/2014 0/76/1518  Systolic BP 343 735 789 99 784 784 128  Diastolic BP 80 78 74 63 79 68 70  Wt. (Lbs) 153 156.4 156 156 156.6 158.08 157  BMI 27.98 28.6 28.53 28.53 28.64 28.91 28.71   Foot/eye exam completion dates Latest Ref Rng 01/27/2015 12/30/2013  Eye Exam No Retinopathy - No Retinopathy  Foot exam Order - - -  Foot Form Completion - Done -         Hyperlipidemia with target LDL less than 100 Hyperlipidemia:Low fat diet discussed and encouraged.   Lipid  Panel  Lab Results  Component Value Date   CHOL 151 08/25/2014   HDL 34* 08/25/2014   LDLCALC 99 08/25/2014   TRIG 90 08/25/2014   CHOLHDL 4.4 08/25/2014   Controlled, no change in medication

## 2015-01-27 NOTE — Patient Instructions (Addendum)
F/u in 2 month, call i9 you need me before  You will get script for light weight wheelchair, also recommend max  CAP hrs  Sign for eye exam and last eval by neuropsych  You are refert to podiatry for toenalil cutting  Excellent blood sugar  Thanks for choosing [facility], we consider it a privelige to serve you.   TRY NOT TO FALL

## 2015-01-29 ENCOUNTER — Other Ambulatory Visit: Payer: Self-pay | Admitting: Family Medicine

## 2015-02-02 ENCOUNTER — Telehealth: Payer: Self-pay | Admitting: *Deleted

## 2015-02-02 ENCOUNTER — Other Ambulatory Visit: Payer: Self-pay | Admitting: *Deleted

## 2015-02-02 MED ORDER — MEDROXYPROGESTERONE ACETATE 150 MG/ML IM SUSP: 150.0000 mg | INTRAMUSCULAR | Status: DC

## 2015-02-02 NOTE — Telephone Encounter (Signed)
Victoria [name] with Victoria [name] states they do have Rx for the Depo Provera. LM for pt to return call.

## 2015-02-03 ENCOUNTER — Ambulatory Visit (INDEPENDENT_AMBULATORY_CARE_PROVIDER_SITE_OTHER): Payer: Medicare Other | Admitting: *Deleted

## 2015-02-03 ENCOUNTER — Encounter: Payer: Self-pay | Admitting: *Deleted

## 2015-02-03 DIAGNOSIS — Z3202 Encounter for pregnancy test, result negative: Secondary | ICD-10-CM | POA: Diagnosis not present

## 2015-02-03 DIAGNOSIS — Z3042 Encounter for surveillance of injectable contraceptive: Secondary | ICD-10-CM

## 2015-02-03 LAB — POCT URINE PREGNANCY: PREG TEST UR: NEGATIVE

## 2015-02-03 MED ORDER — MEDROXYPROGESTERONE ACETATE 150 MG/ML IM SUSP
150.0000 mg | Freq: Once | INTRAMUSCULAR | Status: AC
  Administered 2015-02-03: 150 mg via INTRAMUSCULAR

## 2015-02-03 NOTE — Progress Notes (Signed)
Pt here for Depo. Taking Megace daily. Spotting and passing clots. Advised if the shot today didn't stop bleeding, let us know. Caretaker voiced understanding. Return in 12 weeks for next shot. [place]

## 2015-02-13 ENCOUNTER — Telehealth: Payer: Self-pay | Admitting: *Deleted

## 2015-02-13 NOTE — Telephone Encounter (Signed)
Noted  

## 2015-02-13 NOTE — Telephone Encounter (Signed)
Pt has a appt with [place] [facility] 02/18/15 at 2:00 and pt is aware.

## 2015-02-16 ENCOUNTER — Other Ambulatory Visit: Payer: Self-pay | Admitting: Family Medicine

## 2015-02-18 DIAGNOSIS — E1151 Type 2 diabetes mellitus with diabetic peripheral angiopathy without gangrene: Secondary | ICD-10-CM | POA: Diagnosis not present

## 2015-02-18 DIAGNOSIS — I739 Peripheral vascular disease, unspecified: Secondary | ICD-10-CM | POA: Diagnosis not present

## 2015-02-18 DIAGNOSIS — L603 Nail dystrophy: Secondary | ICD-10-CM | POA: Diagnosis not present

## 2015-02-18 DIAGNOSIS — M79609 Pain in unspecified limb: Secondary | ICD-10-CM | POA: Diagnosis not present

## 2015-03-09 ENCOUNTER — Other Ambulatory Visit: Payer: Self-pay | Admitting: Family Medicine

## 2015-03-22 NOTE — Assessment & Plan Note (Signed)
Controlled, no change in medication Victoria [name] is reminded of the importance of commitment to daily physical activity for 30 minutes or more, as able and the need to limit carbohydrate intake to 30 to 60 grams per meal to help with blood sugar control.   The need to take medication as prescribed, test blood sugar as directed, and to call between visits if there is a concern that blood sugar is uncontrolled is also discussed.   Victoria [name] is reminded of the importance of daily foot exam, annual eye examination, and good blood sugar, blood pressure and cholesterol control.  Diabetic Labs Latest Ref Rng 01/23/2015 08/29/2014 08/25/2014 05/06/2014 11/21/2013  HbA1c <5.7 % 6.1(H) - 5.9(H) 6.1(H) 6.0(H)  Microalbumin <2.0 mg/dL - - - 2.5(H) -  Micro/Creat Ratio 0.0 - 30.0 mg/g - - - 9.5 -  Chol 0 - 200 mg/dL - - 151 171 141  HDL >=46 mg/dL - - 34(L) 37(L) 36(L)  Calc LDL 0 - 99 mg/dL - - 99 113(H) 90  Triglycerides <150 mg/dL - - 90 104 74  Creatinine 0.50 - 1.10 mg/dL - 1.03 1.01 1.06 0.93   BP/Weight 01/27/2015 10/27/2014 10/14/2014 09/02/2014 08/29/2014 08/26/2014 1/61/0960  Systolic BP 454 098 119 99 147 829 562  Diastolic BP 80 78 74 63 79 68 70  Wt. (Lbs) 153 156.4 156 156 156.6 158.08 157  BMI 27.98 28.6 28.53 28.53 28.64 28.91 28.71   Foot/eye exam completion dates Latest Ref Rng 01/27/2015 12/30/2013  Eye Exam No Retinopathy - No Retinopathy  Foot exam Order - - -  Foot Form Completion - Done -

## 2015-03-22 NOTE — Assessment & Plan Note (Signed)
Has expressive aphasia and in incapable of independent living, condiotion  Is obviously progressing and closer care as well as increased assistance a t home needed

## 2015-03-22 NOTE — Assessment & Plan Note (Signed)
Hyperlipidemia:Low fat diet discussed and encouraged.   Lipid Panel  Lab Results  Component Value Date   CHOL 151 08/25/2014   HDL 34* 08/25/2014   LDLCALC 99 08/25/2014   TRIG 90 08/25/2014   CHOLHDL 4.4 08/25/2014   Controlled, no change in medication

## 2015-03-22 NOTE — Assessment & Plan Note (Signed)
Currently being treated at [facility] will try to move care locally

## 2015-03-22 NOTE — Assessment & Plan Note (Signed)
Recurrent falls and increased need of personal assitance due to neurologic illness, apply for increased CAP hours, form completed

## 2015-03-22 NOTE — Assessment & Plan Note (Signed)
Home safety reviewed with Mother and another caregiver. Due to her degenerative neurologiic condition ,needs increased CAP hrs

## 2015-04-06 ENCOUNTER — Other Ambulatory Visit: Payer: Self-pay

## 2015-04-06 MED ORDER — TRAZODONE HCL 50 MG PO TABS: 50.0000 mg | ORAL_TABLET | Freq: Every day | ORAL | Status: DC

## 2015-04-06 MED ORDER — GABAPENTIN 300 MG PO CAPS: 300.0000 mg | ORAL_CAPSULE | Freq: Two times a day (BID) | ORAL | Status: DC

## 2015-04-06 MED ORDER — GALANTAMINE HYDROBROMIDE ER 24 MG PO CP24: 24.0000 mg | ORAL_CAPSULE | Freq: Every day | ORAL | Status: DC

## 2015-04-06 MED ORDER — CITALOPRAM HYDROBROMIDE 20 MG PO TABS: 20.0000 mg | ORAL_TABLET | Freq: Every day | ORAL | Status: DC

## 2015-04-06 MED ORDER — MEMANTINE HCL 10 MG PO TABS: 10.0000 mg | ORAL_TABLET | Freq: Two times a day (BID) | ORAL | Status: DC

## 2015-04-07 DIAGNOSIS — Z029 Encounter for administrative examinations, unspecified: Secondary | ICD-10-CM

## 2015-04-14 ENCOUNTER — Encounter: Payer: Self-pay | Admitting: Family Medicine

## 2015-04-14 ENCOUNTER — Ambulatory Visit (INDEPENDENT_AMBULATORY_CARE_PROVIDER_SITE_OTHER): Payer: Medicare Other | Admitting: Family Medicine

## 2015-04-14 VITALS — BP 106/72 | HR 78 | Resp 18 | Ht 64.0 in | Wt 154.0 lb

## 2015-04-14 DIAGNOSIS — E785 Hyperlipidemia, unspecified: Secondary | ICD-10-CM

## 2015-04-14 DIAGNOSIS — Z23 Encounter for immunization: Secondary | ICD-10-CM | POA: Diagnosis not present

## 2015-04-14 DIAGNOSIS — Z114 Encounter for screening for human immunodeficiency virus [HIV]: Secondary | ICD-10-CM

## 2015-04-14 DIAGNOSIS — E119 Type 2 diabetes mellitus without complications: Secondary | ICD-10-CM | POA: Diagnosis not present

## 2015-04-14 DIAGNOSIS — G10 Huntington's disease: Secondary | ICD-10-CM

## 2015-04-14 DIAGNOSIS — D229 Melanocytic nevi, unspecified: Secondary | ICD-10-CM

## 2015-04-14 DIAGNOSIS — M6281 Muscle weakness (generalized): Secondary | ICD-10-CM

## 2015-04-14 DIAGNOSIS — E663 Overweight: Secondary | ICD-10-CM

## 2015-04-14 MED ORDER — MEMANTINE HCL 10 MG PO TABS: 10.0000 mg | ORAL_TABLET | Freq: Two times a day (BID) | ORAL | Status: DC

## 2015-04-14 MED ORDER — GALANTAMINE HYDROBROMIDE ER 24 MG PO CP24: 24.0000 mg | ORAL_CAPSULE | Freq: Every day | ORAL | Status: DC

## 2015-04-14 MED ORDER — LOVASTATIN 20 MG PO TABS: ORAL_TABLET | ORAL | Status: DC

## 2015-04-14 MED ORDER — METFORMIN HCL 500 MG PO TABS: ORAL_TABLET | ORAL | Status: DC

## 2015-04-14 NOTE — Progress Notes (Signed)
Subjective:    Patient ID: Victoria [name], female    DOB: [birth year], 44 y.o.   MRN: [id number]  HPI   CHARLESTON VIERLING     MRN: [id number]      DOB: [birth year]   HPI History provisded by her mother  As she is incapable [name] is here for follow up and re-evaluation of chronic medical conditions, medication management and review of any available recent lab and radiology data.  Preventive health is updated, specifically  Cancer screening and Immunization.   The PT denies any adverse reactions to current medications since the last visit.  C/o mole on upper back which seems to be getting larger, and seems to itch  ROS Denies recent fever or chills. Denies sinus pressure, nasal congestion, ear pain or sore throat. Denies chest congestion, productive cough or wheezing. Denies chest pains, palpitations and leg swelling Denies abdominal pain, nausea, vomiting,diarrhea or constipation.   Denies dysuria, frequency, hesitancy or incontinence.  Denies headaches, seizures, numbness, or tingling.  PE  BP 106/72 mmHg  Pulse 78  Resp 18  Ht 5\' 4"  (1.626 m)  Wt 154 lb (69.854 kg)  BMI 26.42 kg/m2  SpO2 96%  Patient alert  and in no cardiopulmonary distress.  HEENT: No facial asymmetry, EOMI,   oropharynx pink and moist.  Neck supple no JVD, no mass.  Chest: Clear to auscultation bilaterally.  CVS: S1, S2 no murmurs, no S3.Regular rate.  ABD: Soft non tender.   Ext: No edema  MS: Adequate ROM spine, shoulders, hips and knees.Poor coordination and truncal instability Skin: Intact, pigmented nevus on upper back, irregular border and pigmentation  Psych: Good eye contact, blunted  affect. Memory loss, not anxious or depressed appearing  CNS: CN 2-12 intact, decreased power,  And tone throughout.   Assessment & Plan   HUNTINGTON'S DISEASE Continued deterioration oin level of function, however, getting a lot of love and attention form her mom and another family member who  is now assisting May transfer neuro care locally  Hyperlipidemia with target LDL less than 100 Hyperlipidemia:Low fat diet discussed and encouraged.   Lipid Panel  Lab Results  Component Value Date   CHOL 151 08/25/2014   HDL 34* 08/25/2014   LDLCALC 99 08/25/2014   TRIG 90 08/25/2014   CHOLHDL 4.4 08/25/2014      Updated lab needed at/ before next visit.   Diabetes mellitus without complication Controlled, no change in medication [name] is reminded of the importance of commitment to daily physical activity for 30 minutes or more, as able and the need to limit carbohydrate intake to 30 to 60 grams per meal to help with blood sugar control.   The need to take medication as prescribed, test blood sugar as directed, and to call between visits if there is a concern that blood sugar is uncontrolled is also discussed.   [name] is reminded of the importance of daily foot exam, annual eye examination, and good blood sugar, blood pressure and cholesterol control.  Diabetic Labs Latest Ref Rng 01/23/2015 08/29/2014 08/25/2014 05/06/2014 11/21/2013  HbA1c <5.7 % 6.1(H) - 5.9(H) 6.1(H) 6.0(H)  Microalbumin <2.0 mg/dL - - - 2.5(H) -  Micro/Creat Ratio 0.0 - 30.0 mg/g - - - 9.5 -  Chol 0 - 200 mg/dL - - 151 171 141  HDL >=46 mg/dL - - 34(L) 37(L) 36(L)  Calc LDL 0 - 99 mg/dL - - 99 113(H) 90  Triglycerides <150 mg/dL - - 90 104  74  Creatinine 0.50 - 1.10 mg/dL - 1.03 1.01 1.06 0.93   BP/Weight 04/14/2015 01/27/2015 10/27/2014 10/14/2014 09/02/2014 10/06/3788 07/25/971  Systolic BP 532 992 426 834 99 196 222  Diastolic BP 72 80 78 74 63 79 68  Wt. (Lbs) 154 153 156.4 156 156 156.6 158.08  BMI 26.42 27.98 28.6 28.53 28.53 28.64 28.91   Foot/eye exam completion dates Latest Ref Rng 01/27/2015 12/30/2013  Eye Exam No Retinopathy - No Retinopathy  Foot exam Order - - -  Foot Form Completion - Done -         Pigmented nevus Mole present for months, however concern voiced that seems to be  getting larger and that it itches , refer derm  Overweight (BMI 25.0-29.9) Unchanged. Patient re-educated about  the importance of commitment to a  minimum of 150 minutes of exercise per week.  The importance of healthy food choices with portion control discussed. Encouraged to start a food diary, count calories and to consider  joining a support group. Sample diet sheets offered. Goals set by the patient for the next several months.   Weight /BMI 04/14/2015 01/27/2015 10/27/2014  WEIGHT 154 lb 153 lb 156 lb 6.4 oz  HEIGHT 5\' 4"  5\' 2"  5\' 2"   BMI 26.42 kg/m2 27.98 kg/m2 28.6 kg/m2    Current exercise per week 90 minutes.        Review of Systems     Objective:   Physical Exam        Assessment & Plan:

## 2015-04-14 NOTE — Patient Instructions (Addendum)
F/uy  In 4 months, call if you needme sooner  HAPPY B/DAY  You are referred to [facility], and for eye exam  Please work on good  health habits so that your health will improve. 1. Commitment to daily physical activity for 30 to 60  minutes, if you are able to do this.  2. Commitment to wise food choices. Aim for half of your  food intake to be vegetable and fruit, one quarter starchy foods, and one quarter protein. Try to eat on a regular schedule  3 meals per day, snacking between meals should be limited to vegetables or fruits or small portions of nuts. 64 ounces of water per day is generally recommended, unless you have specific health conditions, like heart failure or kidney failure where you will need to limit fluid intake.  3. Commitment to sufficient and a  good quality of physical and mental rest daily, generally between 6 to 8 hours per day.  WITH PERSISTANCE AND PERSEVERANCE, THE IMPOSSIBLE , BECOMES THE NORM!  Thanks for choosing [facility], we consider it a privelige to serve you.   Fasting labs and microalb Dec 15 or after

## 2015-04-28 ENCOUNTER — Ambulatory Visit (INDEPENDENT_AMBULATORY_CARE_PROVIDER_SITE_OTHER): Payer: Medicare Other | Admitting: *Deleted

## 2015-04-28 ENCOUNTER — Ambulatory Visit: Payer: Medicare Other

## 2015-04-28 ENCOUNTER — Encounter: Payer: Self-pay | Admitting: *Deleted

## 2015-04-28 DIAGNOSIS — Z3042 Encounter for surveillance of injectable contraceptive: Secondary | ICD-10-CM

## 2015-04-28 MED ORDER — MEDROXYPROGESTERONE ACETATE 150 MG/ML IM SUSP
150.0000 mg | Freq: Once | INTRAMUSCULAR | Status: AC
  Administered 2015-04-28: 150 mg via INTRAMUSCULAR

## 2015-04-28 NOTE — Progress Notes (Signed)
Pt here for Depo. Pt is not sexually active so no pregnancy test was done. Family  reports having bleeding but is taking Megace and wants to continue with that for now. I advised if bleeding got heavy and stayed heavy, let us know. Family voiced understanding. Return in 12 weeks for next shot. [place]

## 2015-05-07 DIAGNOSIS — L603 Nail dystrophy: Secondary | ICD-10-CM | POA: Diagnosis not present

## 2015-05-07 DIAGNOSIS — E1151 Type 2 diabetes mellitus with diabetic peripheral angiopathy without gangrene: Secondary | ICD-10-CM | POA: Diagnosis not present

## 2015-05-07 DIAGNOSIS — I739 Peripheral vascular disease, unspecified: Secondary | ICD-10-CM | POA: Diagnosis not present

## 2015-05-09 DIAGNOSIS — D229 Melanocytic nevi, unspecified: Secondary | ICD-10-CM | POA: Insufficient documentation

## 2015-05-09 NOTE — Assessment & Plan Note (Signed)
Mole present for months, however concern voiced that seems to be getting larger and that it itches , refer derm

## 2015-05-09 NOTE — Assessment & Plan Note (Addendum)
Continued deterioration oin level of function, however, getting a lot of love and attention form her mom and another family member who is now assisting May transfer neuro care locally

## 2015-05-09 NOTE — Assessment & Plan Note (Signed)
Unchanged. Patient re-educated about  the importance of commitment to a  minimum of 150 minutes of exercise per week.  The importance of healthy food choices with portion control discussed. Encouraged to start a food diary, count calories and to consider  joining a support group. Sample diet sheets offered. Goals set by the patient for the next several months.   Weight /BMI 04/14/2015 01/27/2015 10/27/2014  WEIGHT 154 lb 153 lb 156 lb 6.4 oz  HEIGHT 5\' 4"  5\' 2"  5\' 2"   BMI 26.42 kg/m2 27.98 kg/m2 28.6 kg/m2    Current exercise per week 90 minutes.

## 2015-05-09 NOTE — Assessment & Plan Note (Signed)
Hyperlipidemia:Low fat diet discussed and encouraged.   Lipid Panel  Lab Results  Component Value Date   CHOL 151 08/25/2014   HDL 34* 08/25/2014   LDLCALC 99 08/25/2014   TRIG 90 08/25/2014   CHOLHDL 4.4 08/25/2014      Updated lab needed at/ before next visit.

## 2015-05-09 NOTE — Assessment & Plan Note (Signed)
Controlled, no change in medication Victoria [name] is reminded of the importance of commitment to daily physical activity for 30 minutes or more, as able and the need to limit carbohydrate intake to 30 to 60 grams per meal to help with blood sugar control.   The need to take medication as prescribed, test blood sugar as directed, and to call between visits if there is a concern that blood sugar is uncontrolled is also discussed.   Victoria [name] is reminded of the importance of daily foot exam, annual eye examination, and good blood sugar, blood pressure and cholesterol control.  Diabetic Labs Latest Ref Rng 01/23/2015 08/29/2014 08/25/2014 05/06/2014 11/21/2013  HbA1c <5.7 % 6.1(H) - 5.9(H) 6.1(H) 6.0(H)  Microalbumin <2.0 mg/dL - - - 2.5(H) -  Micro/Creat Ratio 0.0 - 30.0 mg/g - - - 9.5 -  Chol 0 - 200 mg/dL - - 151 171 141  HDL >=46 mg/dL - - 34(L) 37(L) 36(L)  Calc LDL 0 - 99 mg/dL - - 99 113(H) 90  Triglycerides <150 mg/dL - - 90 104 74  Creatinine 0.50 - 1.10 mg/dL - 1.03 1.01 1.06 0.93   BP/Weight 04/14/2015 01/27/2015 10/27/2014 10/14/2014 09/02/2014 [id number]  Systolic BP A999333 99991111 A999333 [id number] 99 [id number]  Diastolic BP 72 80 78 74 63 79 68  Wt. (Lbs) 154 153 156.4 156 156 156.6 158.08  BMI 26.42 27.98 28.6 28.53 28.53 28.64 28.91   Foot/eye exam completion dates Latest Ref Rng 01/27/2015 12/30/2013  Eye Exam No Retinopathy - No Retinopathy  Foot exam Order - - -  Foot Form Completion - Done -

## 2015-05-27 DIAGNOSIS — L720 Epidermal cyst: Secondary | ICD-10-CM | POA: Diagnosis not present

## 2015-07-06 ENCOUNTER — Other Ambulatory Visit: Payer: Self-pay | Admitting: Family Medicine

## 2015-07-21 ENCOUNTER — Ambulatory Visit (INDEPENDENT_AMBULATORY_CARE_PROVIDER_SITE_OTHER): Payer: Medicare Other | Admitting: *Deleted

## 2015-07-21 ENCOUNTER — Encounter: Payer: Self-pay | Admitting: *Deleted

## 2015-07-21 DIAGNOSIS — Z3202 Encounter for pregnancy test, result negative: Secondary | ICD-10-CM

## 2015-07-21 DIAGNOSIS — Z3042 Encounter for surveillance of injectable contraceptive: Secondary | ICD-10-CM

## 2015-07-21 LAB — POCT URINE PREGNANCY: PREG TEST UR: NEGATIVE

## 2015-07-21 MED ORDER — MEDROXYPROGESTERONE ACETATE 150 MG/ML IM SUSP
150.0000 mg | Freq: Once | INTRAMUSCULAR | Status: AC
  Administered 2015-07-21: 150 mg via INTRAMUSCULAR

## 2015-07-21 NOTE — Progress Notes (Signed)
Pt here for Depo. Pt has bleeding. When she takes Megace, bleeding stops. Advised to continue taking Megace prn. Pt is due for physical. I advised to schedule an appt and discuss bleeding then. [name] caretaker will schedule an appt after 08/12/15 appt with Dr Moshe Cipro. Return in 12 weeks for next shot. Victoria [name]

## 2015-07-28 ENCOUNTER — Other Ambulatory Visit: Payer: Self-pay | Admitting: Family Medicine

## 2015-08-10 ENCOUNTER — Other Ambulatory Visit: Payer: Self-pay

## 2015-08-10 DIAGNOSIS — E785 Hyperlipidemia, unspecified: Secondary | ICD-10-CM | POA: Diagnosis not present

## 2015-08-10 DIAGNOSIS — Z1159 Encounter for screening for other viral diseases: Secondary | ICD-10-CM | POA: Diagnosis not present

## 2015-08-10 DIAGNOSIS — E119 Type 2 diabetes mellitus without complications: Secondary | ICD-10-CM | POA: Diagnosis not present

## 2015-08-11 ENCOUNTER — Ambulatory Visit: Payer: Medicare Other | Admitting: Family Medicine

## 2015-08-11 LAB — COMPLETE METABOLIC PANEL WITH GFR
ALBUMIN: 4.2 g/dL (ref 3.6–5.1)
ALK PHOS: 48 U/L (ref 33–115)
ALT: 8 U/L (ref 6–29)
AST: 12 U/L (ref 10–30)
BUN: 15 mg/dL (ref 7–25)
CALCIUM: 9.9 mg/dL (ref 8.6–10.2)
CO2: 26 mmol/L (ref 20–31)
CREATININE: 1.09 mg/dL (ref 0.50–1.10)
Chloride: 102 mmol/L (ref 98–110)
GFR, Est African American: 71 mL/min (ref >=60)
GFR, Est Non African American: 62 mL/min (ref >=60)
GLUCOSE: 90 mg/dL (ref 65–99)
Potassium: 4.3 mmol/L (ref 3.5–5.3)
SODIUM: 140 mmol/L (ref 135–146)
TOTAL PROTEIN: 7.4 g/dL (ref 6.1–8.1)
Total Bilirubin: 0.5 mg/dL (ref 0.2–1.2)

## 2015-08-11 LAB — TSH: TSH: 1.53 m[IU]/L

## 2015-08-11 LAB — HEMOGLOBIN A1C
Hgb A1c MFr Bld: 5.9 % — ABNORMAL HIGH (ref <5.7)
Mean Plasma Glucose: 123 mg/dL — ABNORMAL HIGH (ref <117)

## 2015-08-11 LAB — LIPID PANEL
CHOL/HDL RATIO: 4.4 ratio (ref <=5.0)
CHOLESTEROL: 164 mg/dL (ref 125–200)
HDL: 37 mg/dL — AB (ref >=46)
LDL Cholesterol: 109 mg/dL (ref <130)
Triglycerides: 88 mg/dL (ref <150)
VLDL: 18 mg/dL (ref <30)

## 2015-08-11 LAB — HIV ANTIBODY (ROUTINE TESTING W REFLEX): HIV: NONREACTIVE

## 2015-08-12 ENCOUNTER — Encounter: Payer: Self-pay | Admitting: Family Medicine

## 2015-08-12 ENCOUNTER — Ambulatory Visit (HOSPITAL_COMMUNITY)
Admission: RE | Admit: 2015-08-12 | Discharge: 2015-08-12 | Disposition: A | Discharge status: Home / Self Care | Payer: Medicare Other | Source: Ambulatory Visit | Attending: Family Medicine | Admitting: Family Medicine

## 2015-08-12 ENCOUNTER — Ambulatory Visit (INDEPENDENT_AMBULATORY_CARE_PROVIDER_SITE_OTHER): Payer: Medicare Other | Admitting: Family Medicine

## 2015-08-12 VITALS — BP 114/78 | HR 96 | Resp 16 | Ht 64.0 in | Wt 146.0 lb

## 2015-08-12 DIAGNOSIS — M25511 Pain in right shoulder: Secondary | ICD-10-CM

## 2015-08-12 DIAGNOSIS — F329 Major depressive disorder, single episode, unspecified: Secondary | ICD-10-CM

## 2015-08-12 DIAGNOSIS — G10 Huntington's disease: Secondary | ICD-10-CM | POA: Diagnosis not present

## 2015-08-12 DIAGNOSIS — F0280 Dementia in other diseases classified elsewhere without behavioral disturbance: Secondary | ICD-10-CM

## 2015-08-12 DIAGNOSIS — F0393 Unspecified dementia, unspecified severity, with mood disturbance: Secondary | ICD-10-CM

## 2015-08-12 DIAGNOSIS — E785 Hyperlipidemia, unspecified: Secondary | ICD-10-CM

## 2015-08-12 DIAGNOSIS — E119 Type 2 diabetes mellitus without complications: Secondary | ICD-10-CM | POA: Diagnosis not present

## 2015-08-12 DIAGNOSIS — N924 Excessive bleeding in the premenopausal period: Secondary | ICD-10-CM

## 2015-08-12 DIAGNOSIS — R32 Unspecified urinary incontinence: Secondary | ICD-10-CM

## 2015-08-12 MED ORDER — GABAPENTIN 300 MG PO CAPS: 300.0000 mg | ORAL_CAPSULE | Freq: Two times a day (BID) | ORAL | Status: DC

## 2015-08-12 MED ORDER — DICLOFENAC SODIUM 1 % TD GEL: 2.0000 g | Freq: Four times a day (QID) | TRANSDERMAL | Status: DC

## 2015-08-12 MED ORDER — CYCLOBENZAPRINE HCL 10 MG PO TABS: ORAL_TABLET | ORAL | Status: DC

## 2015-08-12 MED ORDER — LOVASTATIN 20 MG PO TABS: ORAL_TABLET | ORAL | Status: DC

## 2015-08-12 MED ORDER — CITALOPRAM HYDROBROMIDE 20 MG PO TABS: ORAL_TABLET | ORAL | Status: DC

## 2015-08-12 NOTE — Progress Notes (Signed)
Subjective:    Patient ID: Victoria [name], female    DOB: 05-10-1971, 45 y.o.   MRN: [id number]  HPI  Accompanied by Mom and Aunt who are her care givers and who speak for her The PT is here for follow up and re-evaluation of chronic medical conditions, medication management and review of any available recent lab and radiology data.  Preventive health is updated, specifically  Cancer screening and Immunization.   2 month h/o increased gait instability, noted to be leaning to right side, uncomfortable and grimacing low back pain in early morning when family trying to raise her from bed. Increasing stiffness noted, and request foam overlay for bed . Increasingly difficult with mobility, tires and fall risk increased, request wheelchair, also incontinence supplies. Less verbal, no choking noted, appetite fair, but eating needs to be supervised and food chopped Requests shoes     Review of Systems See HPI  All history provided by family, pt incapable, now aphasic Denies recent fever or chills. Denies sinus pressure, nasal congestion, ear pain or sore throat. Denies chest congestion, productive cough or wheezing. Denies orthopnea, PND and leg swelling Denies abdominal pain, nausea, vomiting,diarrhea or constipation.   Denies dysuria, frequency,  C/o incontinence   Occasional headaches, denies seizures, .  Denies skin break down or rash.        Objective:   Physical Exam  BP 114/78 mmHg  Pulse 96  Resp 16  Ht 5\' 4"  (1.626 m)  Wt 146 lb (66.225 kg)  BMI 25.05 kg/m2  SpO2 99%  Patient alert , flat affect and in no cardiopulmonary distress.  HEENT: No facial asymmetry, EOMI,   oropharynx pink and moist.  Neck decreased ROM with right trapezius spasm,  no JVD, no mass.  Chest: Clear to auscultation bilaterally.  CVS: S1, S2 no murmurs, no S3.Regular rate.  ABD: Soft non tender.   Ext: No edema  MS: decreased  ROM spine, and right shoulder, normal in  hips and  knees.  Skin: Intact, no ulcerations or rash noted.  Psych: Good eye contact, flatl affect. Memory unable to assess not anxious or depressed appearing.  CNS: CN 2-12 intact, increased tone and spacticity noted, powe  Grossly normal       Assessment & Plan:  Shoulder pain, right 2 month history , no known trauma , limited mobility, x ray rules out fracture. Needs Ot and muscle relaxant, significant right trapezius spasm  Huntington's disease Progressive disease with disability. Phasic, reduced safe mobility, increased muscle tone with spasticity, high fall risk Will benefit from OT, needs wheelchair also Mattress overlay needed due to prolonged immobility in bed increasing risk of ulcer and also also causing back pain  Hyperlipidemia with target LDL less than 100 Hyperlipidemia:Low fat diet discussed and encouraged.   Lipid Panel  Lab Results  Component Value Date   CHOL 164 08/10/2015   HDL 37* 08/10/2015   LDLCALC 109 08/10/2015   TRIG 88 08/10/2015   CHOLHDL 4.4 08/10/2015    Controlled, no change in medication     Diabetes mellitus without complication (HCC) Improved, d/c metformin Ms. Tuckerman is reminded of the importance of commitment to daily physical activity for 30 minutes or more, as able and the need to limit carbohydrate intake to 30 to 60 grams per meal to help with blood sugar control.   The need to take medication as prescribed, test blood sugar as directed, and to call between visits if there is a concern that blood  sugar is uncontrolled is also discussed.   Ms. Cree is reminded of the importance of daily foot exam, annual eye examination, and good blood sugar, blood pressure and cholesterol control.  Diabetic Labs Latest Ref Rng 08/10/2015 01/23/2015 08/29/2014 08/25/2014 05/06/2014  HbA1c <5.7 % 5.9(H) 6.1(H) - 5.9(H) 6.1(H)  Microalbumin <2.0 mg/dL - - - - 2.5(H)  Micro/Creat Ratio 0.0 - 30.0 mg/g - - - - 9.5  Chol 125 - 200 mg/dL 164 - - 151 171    HDL >=46 mg/dL 37(L) - - 34(L) 37(L)  Calc LDL <130 mg/dL 109 - - 99 113(H)  Triglycerides <150 mg/dL 88 - - 90 104  Creatinine 0.50 - 1.10 mg/dL 1.09 - 1.03 1.01 1.06   BP/Weight 08/12/2015 04/14/2015 01/27/2015 10/27/2014 10/14/2014 [id number] 0000000  Systolic BP 99991111 A999333 99991111 A999333 123456 [id number]  Diastolic BP 78 72 80 78 74 79 68  Wt. 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

## 2015-08-12 NOTE — Assessment & Plan Note (Addendum)
2 month history , no known trauma , limited mobility, x ray rules out fracture. Needs Ot and muscle relaxant, significant right trapezius spasm

## 2015-08-12 NOTE — Patient Instructions (Signed)
Annual physical exam in 4 month, call if you need me sooner  Stop metformin, continue diabetic diet/  May collect diabetic shoes at [employer] is on file  Scripts sent for wheelchair, pull ups/ underpads, shoes and foam cushion  Pls get xray of shoulder today  Take tylenol 1 daily and new is voltaren gel and flexeril at night. You are referred to OT

## 2015-08-13 ENCOUNTER — Telehealth: Payer: Self-pay | Admitting: Family Medicine

## 2015-08-13 DIAGNOSIS — F0393 Unspecified dementia, unspecified severity, with mood disturbance: Secondary | ICD-10-CM | POA: Insufficient documentation

## 2015-08-13 DIAGNOSIS — F0280 Dementia in other diseases classified elsewhere without behavioral disturbance: Secondary | ICD-10-CM | POA: Insufficient documentation

## 2015-08-13 DIAGNOSIS — F329 Major depressive disorder, single episode, unspecified: Secondary | ICD-10-CM

## 2015-08-13 DIAGNOSIS — R32 Unspecified urinary incontinence: Secondary | ICD-10-CM | POA: Insufficient documentation

## 2015-08-13 NOTE — Assessment & Plan Note (Signed)
Controlled, no change in medication  

## 2015-08-13 NOTE — Telephone Encounter (Signed)
Family aware.  Appointment already given and they wish to keep it.  Aware of medication change.

## 2015-08-13 NOTE — Assessment & Plan Note (Signed)
Hyperlipidemia:Low fat diet discussed and encouraged.   Lipid Panel  Lab Results  Component Value Date   CHOL 164 08/10/2015   HDL 37* 08/10/2015   LDLCALC 109 08/10/2015   TRIG 88 08/10/2015   CHOLHDL 4.4 08/10/2015    Controlled, no change in medication

## 2015-08-13 NOTE — Assessment & Plan Note (Signed)
Controlled with megace and depo provera

## 2015-08-13 NOTE — Assessment & Plan Note (Signed)
Progressive deterioration in cognition and ability to safely ambulate, incontinence supplies needed

## 2015-08-13 NOTE — Telephone Encounter (Signed)
Pls notify pharmacy and family, stop test strips and lancets, she is now diet controlled. I have removed from med list Stop ramipril , no longer needs this , normal bP and diet controlled diabetic Tell Mom, OK to reconsider eye exam, may delay this as Erlene unlikely o tolerate the full exam, and her vision is grossly normal, Ok to cancel

## 2015-08-13 NOTE — Assessment & Plan Note (Signed)
Continue current meds 

## 2015-08-13 NOTE — Assessment & Plan Note (Addendum)
Progressive disease with disability. Phasic, reduced safe mobility, increased muscle tone with spasticity, high fall risk Will benefit from OT, needs wheelchair also Mattress overlay needed due to prolonged immobility in bed increasing risk of ulcer and also also causing back pain

## 2015-08-13 NOTE — Assessment & Plan Note (Signed)
Improved, d/c metformin Victoria [name] is reminded of the importance of commitment to daily physical activity for 30 minutes or more, as able and the need to limit carbohydrate intake to 30 to 60 grams per meal to help with blood sugar control.   The need to take medication as prescribed, test blood sugar as directed, and to call between visits if there is a concern that blood sugar is uncontrolled is also discussed.   Victoria [name] is reminded of the importance of daily foot exam, annual eye examination, and good blood sugar, blood pressure and cholesterol control.  Diabetic Labs Latest Ref Rng 08/10/2015 01/23/2015 08/29/2014 08/25/2014 05/06/2014  HbA1c <5.7 % 5.9(H) 6.1(H) - 5.9(H) 6.1(H)  Microalbumin <2.0 mg/dL - - - - 2.5(H)  Micro/Creat Ratio 0.0 - 30.0 mg/g - - - - 9.5  Chol 125 - 200 mg/dL 164 - - 151 171  HDL >=46 mg/dL 37(L) - - 34(L) 37(L)  Calc LDL <130 mg/dL 109 - - 99 113(H)  Triglycerides <150 mg/dL 88 - - 90 104  Creatinine 0.50 - 1.10 mg/dL 1.09 - 1.03 1.01 1.06   BP/Weight 08/12/2015 04/14/2015 01/27/2015 10/27/2014 10/14/2014 [id number] 0000000  Systolic BP 99991111 A999333 99991111 A999333 123456 [id number]  Diastolic BP 78 72 80 78 74 79 68  Wt. (Lbs) 146 154 153 156.4 156 156.6 158.08  BMI 25.05 26.42 27.98 28.6 28.53 28.64 28.91   Foot/eye exam completion dates Latest Ref Rng 01/27/2015 12/30/2013  Eye Exam No Retinopathy - No Retinopathy  Foot exam Order - - -  Foot Form Completion - Done -

## 2015-08-19 ENCOUNTER — Encounter (HOSPITAL_COMMUNITY): Payer: Self-pay | Admitting: Specialist

## 2015-08-19 ENCOUNTER — Ambulatory Visit (HOSPITAL_COMMUNITY): Payer: Medicare Other | Attending: Family Medicine | Admitting: Specialist

## 2015-08-19 DIAGNOSIS — M25511 Pain in right shoulder: Secondary | ICD-10-CM | POA: Insufficient documentation

## 2015-08-19 DIAGNOSIS — M25611 Stiffness of right shoulder, not elsewhere classified: Secondary | ICD-10-CM | POA: Diagnosis not present

## 2015-08-19 DIAGNOSIS — Z8669 Personal history of other diseases of the nervous system and sense organs: Secondary | ICD-10-CM | POA: Insufficient documentation

## 2015-08-19 NOTE — Therapy (Signed)
[facility] [place], [place], [address] Phone: [phone]   Fax:  [phone]  Occupational Therapy Evaluation  Patient Details  Name: Victoria [name] MRN: [id number] Date of Birth: [birth year] Referring Provider: Dr. Tula Nakayama  Encounter Date: 08/19/2015      OT End of Session - 08/19/15 1731    Visit Number 1   Number of Visits 12   Date for OT Re-Evaluation 09/30/15  mini reassess on 3/22   Authorization Type medicare/medicaid   Authorization Time Period before 10th visit   Authorization - Visit Number 1   Authorization - Number of Visits 10   OT Start Time [id number]   OT Stop Time 1515   OT Time Calculation (min) 41 min   Activity Tolerance Patient tolerated treatment well   Behavior During Therapy [facility] for tasks assessed/performed      Past Medical History  Diagnosis Date  . Hypertension   . Obesity   . Dyslipidemia   . Mild cognitive impairment   . Perennial allergic rhinitis   . Diabetes mellitus   . Anxiety   . Huntington's disease ([place])   . Menorrhagia with regular cycle 06/02/2014  . Fibroids 06/10/2014  . Sleep apnea     cannot tolerate CPAP.    No past surgical history on file.  There were no vitals filed for this visit.  Visit Diagnosis:  Right shoulder pain - Plan: Ot plan of care cert/re-cert  Stiffness of joint, shoulder region, right - Plan: Ot plan of care cert/re-cert  H/O Huntington's disease - Plan: Ot plan of care cert/re-cert      Subjective Assessment - 08/19/15 1722    Subjective  S:  Ow (pointing to shoulder)   Patient is accompained by: Family member   Pertinent History Patient is a 45 year old female with Huntington's diseased in advanced stage.  Approximately one month ago, patient began complaining of right shoulder pain.  No known injury, however, caregiver reports that she does fall frequently.  Patient was examined by Dr. Moshe Cipro and referred to occupational therapy  for evaluation and treatment.    Limitations cognitive and physical deficits associated with Huntingtons disease.   Patient Stated Goals decrease pain   Currently in Pain? Yes   Pain Score 7    Pain Location Shoulder   Pain Orientation Right   Pain Descriptors / Indicators Aching   Pain Type Acute pain   Pain Onset 1 to 4 weeks ago   Pain Frequency Intermittent   Aggravating Factors  movement above waist height   Pain Relieving Factors heat   Effect of Pain on Daily Activities decrease use of right arm with functional activities            [facility] OT Assessment - 08/19/15 0001    Assessment   Diagnosis Right Shoulder Pain   Referring Provider Dr. Tula Nakayama   Onset Date --  4 weeks ago   Prior Therapy n/a   Precautions   Precautions Other (comment)   Precaution Comments patient has Huntington's disease and may have issues with her balance and impulsive movements    Restrictions   Weight Bearing Restrictions No   Balance Screen   Has the patient fallen in the past 6 months Yes   How many times? --  several   Has the patient had a decrease in activity level because of a fear of falling?  No   Is the patient reluctant to leave their  home because of a fear of falling?  No   Home  Environment   Family/patient expects to be discharged to: Private residence   Living Arrangements Parent   Available Help at Discharge Family   Type of [place] With Family   Prior Function   Level of Independence Needs assistance with ADLs;Needs assistance with homemaking;Needs assistance with transfers   Vocation Unemployed   Leisure enjoys watching tv, going out, and walking   Comments lives with her mother, has full time personal care attendant "auntie"   ADL   ADL comments [name] requires max pa with dressing, bathing, grooming, toileting.  she requires mod pa to max pa with feeding.  she requires sba-min pa with transfers and ambulates with supervision   Written Expression    Dominant Hand Right   Vision - History   Baseline Vision No visual deficits   Cognition   Overall Cognitive Status History of cognitive impairments - at baseline   Observation/Other Assessments   Focus on Therapeutic Outcomes (FOTO)  46/100   Sensation   Light Touch Appears Intact   AROM   Overall AROM Comments assessed in seated, External rotation and internal rotation with shoulder adducted    AROM Assessment Site Shoulder   Right/Left Shoulder Right   Right Shoulder Flexion 110 Degrees   Right Shoulder ABduction 110 Degrees   Right Shoulder Internal Rotation 50 Degrees   Right Shoulder External Rotation 50 Degrees   PROM   Overall PROM Comments WFL however painful in right shoulder   Strength   Overall Strength Comments difficult to assess due to cognitive issues                  OT Treatments/Exercises (OP) - 08/19/15 0001    Manual Therapy   Manual Therapy Myofascial release   Manual therapy comments manual therapy completed seperately from all other treatment this date   Myofascial Release myofascial release and manual stretching to right upper arm and scapular region with passive stretching to improve pain free mobility in right shoulder region in order to have improved functional use of right arm               OT Education - 08/19/15 1731    Education provided Yes   Education Details table slides   Person(s) Educated Caregiver(s)   Methods Explanation;Demonstration;Handout   Comprehension Verbalized understanding          OT Short Term Goals - 08/19/15 1736    OT SHORT TERM GOAL #1   Title Patient will be educated on HEP for improved right shoulder mobility needed for improved use of right arm with functional activities.    Time 3   Period Weeks   Status New   OT SHORT TERM GOAL #2   Title Patient will improve right shoulder P/ROM to [facility] with decreased pain in order to improve comfort when donning and doffing shirts.   Time 3   Period Weeks    Status New   OT SHORT TERM GOAL #3   Title Patient will decrease pain in her right shoulder to 4/10 for improved ability to eat 25% of her meals with her right arm.    Time 3   Period Weeks   Status New           OT Long Term Goals - 08/19/15 1738    OT LONG TERM GOAL #1   Title Patient will use right arm as dominant with functional  activities and transfers.   Time 6   Period Weeks   Status New   OT LONG TERM GOAL #2   Title Patient will have WFL A/ROM in her right shoulder in order to transfer safely from sit to stand and self feed with her right arm.    Time 6   Period Weeks   Status New   OT LONG TERM GOAL #3   Title Patient will have 2/10 pain or less in her right shoulder when participating in B/ADLs or transfers.   Time 6   Period Weeks   Status New   OT LONG TERM GOAL #4   Title Patient and caregiver will demonstrate safe transfer techniques that alleviate pressure and strain/pulling on right shoulder.   Time 6   Period Weeks   Status New   OT LONG TERM GOAL #5   Title Patient will be educated on adaptive equipment that will improve safety and independence with feeding and grooming tasks.    Time 6   Period Weeks   Status New               Plan - 2015-09-01 1732    Clinical Impression Statement A:  Patient is a 45 year old female with past medical history significant for advanced Huntington's disease with associated deficits with mobility, cognition, and independence with daily tasks.  Patient has a full time caregiver and lives at home with her mother.  She has max assist with most BADL's however, she does like to participate as much as possible.  Recent shoulder pain in her right shoulder has caused a decrease in independence with BADLs such as feeding and grooming.     Pt will benefit from skilled therapeutic intervention in order to improve on the following deficits (Retired) Decreased range of motion;Decreased safety awareness;Increased fascial  restricitons;Increased muscle spasms;Pain   Rehab Potential Good   OT Frequency 2x / week   OT Duration 6 weeks   OT Treatment/Interventions Self-care/ADL training;Cryotherapy;Moist Heat;Ultrasound;Therapeutic exercise;Neuromuscular education;DME and/or AE instruction;Passive range of motion;Manual Therapy;Therapist, nutritional;Therapeutic exercises;Therapeutic activities;Patient/family education;Electrical Stimulation   Plan P:  Patient will benefit from skilled OT intervention to decrease pain in her right shoulder in order to improve use of right arm with feeding, grooming, and transfers. Next session:  manual therapy, P/ROM progressing to AA/ROM, progress as tolerated.    Consulted and Agree with Plan of Care Patient and caregiver "Jola Baptist"          G-Codes - 09/01/15 1742    Functional Assessment Tool Used FOTO 46% independent, 54% limitied    Functional Limitation Self care   Self Care Current Status [id number]) At least 40 percent but less than 60 percent impaired, limited or restricted   Self Care Goal Status [id number]) At least 20 percent but less than 40 percent impaired, limited or restricted      Problem List Patient Active Problem List   Diagnosis Date Noted  . Urinary incontinence in female 08/13/2015  . 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 Shoulder pain, right 08/12/2015  . Pigmented nevus 05/09/2015  . Huntington's disease ([place]) 10/27/2014  . 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 Dysphagia 10/14/2014  . Dandruff 09/07/2014  . Intramural leiomyoma of uterus 06/30/2014  . Heavy menses 12/03/2013  . Falls 08/03/2011  . Lack of coordination 08/03/2011  . Muscle weakness (generalized) 08/03/2011  . HUNTINGTON'S DISEASE 03/15/2009  . Diabetes mellitus without complication ([place]) [id number]  . Unspecified visual loss 02/19/2008  .  Hyperlipidemia with target LDL less than 100 07/19/2007  . Overweight (BMI 25.0-29.9) 07/19/2007  . Neurodegenerative  cognitive impairment 07/19/2007  . ALLERGIC RHINITIS, SEASONAL 07/19/2007    Vangie Bicker, OTR/L [phone]  08/19/2015, 5:46 PM  [facility] [address] [place], [place], [address] Phone: [phone]   Fax:  [phone]  Name: Victoria [name] MRN: [id number] Date of Birth: May 10, 1971

## 2015-08-19 NOTE — Patient Instructions (Signed)
SHOULDER: Flexion On Table   Place hands on table, elbows straight. Move arms across the table. Hold _10__ seconds. _5__ reps per set, __3_ sets per day, _7__ days per week  Abduction (Passive)   With arm out to side, resting on table with palm down on table, stretch arm across the table __10__ seconds. Repeat _5___ times. Do ___3_ sessions per day.  Copyright  VHI. All rights reserved.     Internal Rotation (Assistive)   Seated with elbow bent at right angle and held against side, slide arm on table surface in an inward arc. Repeat __10__ times. Do __3__ sessions per day. Activity: Use this motion to brush crumbs off the table.  Copyright  VHI. All rights reserved.

## 2015-08-21 ENCOUNTER — Ambulatory Visit (HOSPITAL_COMMUNITY): Payer: Medicare Other | Admitting: Specialist

## 2015-08-21 DIAGNOSIS — M25611 Stiffness of right shoulder, not elsewhere classified: Secondary | ICD-10-CM | POA: Diagnosis not present

## 2015-08-21 DIAGNOSIS — M25511 Pain in right shoulder: Secondary | ICD-10-CM | POA: Diagnosis not present

## 2015-08-21 DIAGNOSIS — Z8669 Personal history of other diseases of the nervous system and sense organs: Secondary | ICD-10-CM

## 2015-08-21 NOTE — Therapy (Signed)
[place], [place], [address] Phone: [phone]   Fax:  [phone]  Occupational Therapy Treatment  Patient Details  Name: Victoria [name] MRN: [id number] Date of Birth: 1970/07/22 Referring Provider: Dr. Tula Nakayama  Encounter Date: 08/21/2015      OT End of Session - 08/21/15 1138    Visit Number 2   Number of Visits 12   Date for OT Re-Evaluation 09/30/15  mini reassess on 08/30/15   Authorization Type medicare/medicaid   Authorization Time Period before 10th visit   Authorization - Visit Number 2   Authorization - Number of Visits 10   OT Start Time 1025   OT Stop Time 1110   OT Time Calculation (min) 45 min   Activity Tolerance Patient tolerated treatment well   Behavior During Therapy [facility] for tasks assessed/performed      Past Medical History  Diagnosis Date  . Hypertension   . Obesity   . Dyslipidemia   . Mild cognitive impairment   . Perennial allergic rhinitis   . Diabetes mellitus   . Anxiety   . Huntington's disease ([place])   . Menorrhagia with regular cycle 06/02/2014  . Fibroids 06/10/2014  . Sleep apnea     cannot tolerate CPAP.    No past surgical history on file.  There were no vitals filed for this visit.  Visit Diagnosis:  Right shoulder pain  Stiffness of joint, shoulder region, right  H/O Huntington's disease      Subjective Assessment - 08/21/15 1026    Subjective  S:  We tried the exercises one time.     Patient is accompained by: Family member   Currently in Pain? Yes   Pain Score 7    Pain Location Shoulder   Pain Orientation Right   Pain Descriptors / Indicators Aching   Pain Type Acute pain            [facility] OT Assessment - 08/21/15 0001    Assessment   Diagnosis Right Shoulder Pain   Precautions   Precautions Other (comment)   Precaution Comments patient has Huntington's disease and may have issues with her balance and impulsive movements                    OT Treatments/Exercises (OP) - 08/21/15 0001    Transfers   Transfers Sit to Stand;Stand to Sit   Sit to Stand 5: Supervision   Sit to Stand Details (indicate cue type and reason) requires verbal guidance and occassional contact guard to stand still prior to beginning walking in order to avoid loss of balance    Stand to Sit 4: Min guard   Comments patient tends to sit before she is fully in front of bed/chair, requirng therapist to physically assist patient into chair.  max verbal guidance to reach back prior to sitting and to insure body is fully in front of chair before sitting .   Exercises   Exercises Shoulder   Shoulder Exercises: Supine   Protraction PROM;AAROM;10 reps   Horizontal ABduction PROM;AAROM;10 reps   External Rotation PROM;AAROM;10 reps   Internal Rotation PROM;AAROM;10 reps   Flexion PROM;AAROM;10 reps   ABduction PROM;AAROM;10 reps   Shoulder Exercises: Seated   Protraction AROM;10 reps   Protraction Limitations following therapist visual cues, gave therapist high 5 in front of body 10 times    Flexion AROM;10 reps   Flexion Limitations following therapist visual cues -  Other Seated Exercises PNF patterns reaching with right arm to give therapist high five into abduction, external rotation, and then back to her leg also reaching across midline at shoulder height and then back to knee 10 times with max vg and tactile cues    Shoulder Exercises: ROM/Strengthening   Other ROM/Strengthening Exercises box and block 11 minutes and 12" with 2 rest breaks and mod verbal guidance to keep on task   Manual Therapy   Manual Therapy Myofascial release   Manual therapy comments manual therapy completed seperately from all other treatment this date   Myofascial Release myofascial release and manual stretching to right upper arm and scapular region with passive stretching to improve pain free mobility in right shoulder region in order to have improved  functional use of right arm                  OT Short Term Goals - 08/21/15 1507    OT SHORT TERM GOAL #1   Title Patient will be educated on HEP for improved right shoulder mobility needed for improved use of right arm with functional activities.    Time 3   Period Weeks   Status On-going   OT SHORT TERM GOAL #2   Title Patient will improve right shoulder P/ROM to [facility] with decreased pain in order to improve comfort when donning and doffing shirts.   Time 3   Period Weeks   Status On-going   OT SHORT TERM GOAL #3   Title Patient will decrease pain in her right shoulder to 4/10 for improved ability to eat 25% of her meals with her right arm.    Time 3   Period Weeks   Status On-going           OT Long Term Goals - 08/21/15 1508    OT LONG TERM GOAL #1   Title Patient will use right arm as dominant with functional activities and transfers.   Time 6   Period Weeks   Status On-going   OT LONG TERM GOAL #2   Title Patient will have WFL A/ROM in her right shoulder in order to transfer safely from sit to stand and self feed with her right arm.    Time 6   Period Weeks   Status On-going   OT LONG TERM GOAL #3   Title Patient will have 2/10 pain or less in her right shoulder when participating in B/ADLs or transfers.   Time 6   Period Weeks   Status On-going   OT LONG TERM GOAL #4   Title Patient and caregiver will demonstrate safe transfer techniques that alleviate pressure and strain/pulling on right shoulder.   Time 6   Period Weeks   Status On-going   OT LONG TERM GOAL #5   Title Patient will be educated on adaptive equipment that will improve safety and independence with feeding and grooming tasks.    Time 6   Period Weeks   Status On-going               Plan - 08/21/15 1505    Clinical Impression Statement A:  Patient requires max verbal and tactile cues or "follow the leader" type cuing with exercises.  Does better with functional activities that  have a definitive beginning and end (box and block, pinch tree, putty) versus repetitious movement.  Unsafe transfer techniques demonstrated this date.    Plan P:  Patient will use her right arm to reach back  prior to sitting down with moderate cuing.  Complete flattening and rolling of yellow theraputty to improve functional right shoulder movement with less pain.         Problem List Patient Active Problem List   Diagnosis Date Noted  . Urinary incontinence in female 08/13/2015  . 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 Shoulder pain, right 08/12/2015  . Pigmented nevus 05/09/2015  . Huntington's disease ([place]) 10/27/2014  . 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 Dysphagia 10/14/2014  . Dandruff 09/07/2014  . Intramural leiomyoma of uterus 06/30/2014  . Heavy menses 12/03/2013  . Falls 08/03/2011  . Lack of coordination 08/03/2011  . Muscle weakness (generalized) 08/03/2011  . HUNTINGTON'S DISEASE 03/15/2009  . Diabetes mellitus without complication ([place]) [id number]  . Unspecified visual loss 02/19/2008  . Hyperlipidemia with target LDL less than 100 07/19/2007  . Overweight (BMI 25.0-29.9) 07/19/2007  . Neurodegenerative cognitive impairment 07/19/2007  . ALLERGIC RHINITIS, SEASONAL 07/19/2007    Vangie Bicker, OTR/L [phone]  08/21/2015, 3:09 PM  [facility] [address] [place], [place], [address] Phone: [phone]   Fax:  [phone]  Name: Victoria [name] MRN: [id number] Date of Birth: [birth year]

## 2015-08-25 ENCOUNTER — Encounter (HOSPITAL_COMMUNITY): Payer: Medicare Other | Admitting: Occupational Therapy

## 2015-08-26 ENCOUNTER — Ambulatory Visit (HOSPITAL_COMMUNITY): Payer: Medicare Other | Admitting: Specialist

## 2015-08-26 DIAGNOSIS — M25611 Stiffness of right shoulder, not elsewhere classified: Secondary | ICD-10-CM | POA: Diagnosis not present

## 2015-08-26 DIAGNOSIS — Z8669 Personal history of other diseases of the nervous system and sense organs: Secondary | ICD-10-CM | POA: Diagnosis not present

## 2015-08-26 DIAGNOSIS — M25511 Pain in right shoulder: Secondary | ICD-10-CM | POA: Diagnosis not present

## 2015-08-26 NOTE — Therapy (Signed)
[place], [place], [address] Phone: [phone]   Fax:  [phone]  Occupational Therapy Treatment  Patient Details  Name: Victoria [name] MRN: [id number] Date of Birth: Oct 30, 1970 Referring Provider: Dr. Tula Nakayama  Encounter Date: 08/26/2015      OT End of Session - 08/26/15 1421    Visit Number 3   Number of Visits 12   Date for OT Re-Evaluation 09/30/15  mini reassess on 3/12   Authorization Type medicare/medicaid   Authorization Time Period before 10th visit   Authorization - Visit Number 3   Authorization - Number of Visits 10   OT Start Time 1020   OT Stop Time 1100   OT Time Calculation (min) 40 min   Activity Tolerance Patient tolerated treatment well   Behavior During Therapy [facility] for tasks assessed/performed      Past Medical History  Diagnosis Date  . Hypertension   . Obesity   . Dyslipidemia   . Mild cognitive impairment   . Perennial allergic rhinitis   . Diabetes mellitus   . Anxiety   . Huntington's disease ([place])   . Menorrhagia with regular cycle 06/02/2014  . Fibroids 06/10/2014  . Sleep apnea     cannot tolerate CPAP.    No past surgical history on file.  There were no vitals filed for this visit.  Visit Diagnosis:  Right shoulder pain  Stiffness of joint, shoulder region, right  H/O Huntington's disease      Subjective Assessment - 08/26/15 1407    Subjective  S:  Grandmama   Currently in Pain? Yes   Pain Score 7    Pain Location Shoulder   Pain Orientation Right   Pain Descriptors / Indicators Aching   Pain Type Acute pain   Pain Onset 1 to 4 weeks ago   Pain Frequency Intermittent   Aggravating Factors  movement above waist height   Pain Relieving Factors heat   Effect of Pain on Daily Activities decreased use of right arm with functional activities             [facility] OT Assessment - 08/26/15 0001    Assessment   Diagnosis Right Shoulder Pain    Precautions   Precautions Other (comment)   Precaution Comments patient has Huntington's disease and may have issues with her balance and impulsive movements                   OT Treatments/Exercises (OP) - 08/26/15 0001    Bed Mobility   Supine to Sit 4: Min assist;4: Min guard   Supine to Sit Details (indicate cue type and reason) tactile cues to use arms to assist with mobility   Sit to Supine 4: Min assist   Sit to Supine - Details (indicate cue type and reason) max vg and min tactile cues to bring legs onto bed and lay trunk towards head of bed instead of straight back   Transfers   Transfers Sit to Stand;Stand to Sit   Sit to Stand 5: Supervision   Sit to Stand Details (indicate cue type and reason) requires verbal guidance and occassional contact guard to stand still prior to beginning walking in order to avoid loss of balance    Stand to Sit 4: Min guard   Comments patient tends to sit before she is fully in front of bed/chair, requirng therapist to physically assist patient into chair. max verbal guidance to reach back prior  to sitting and to insure body is fully in front of chair before sitting .   Exercises   Exercises Shoulder   Shoulder Exercises: Supine   Protraction PROM;10 reps   Horizontal ABduction PROM;10 reps   External Rotation PROM;10 reps   Internal Rotation PROM;10 reps   Flexion PROM;10 reps   ABduction PROM;10 reps   Shoulder Exercises: ROM/Strengthening   Other ROM/Strengthening Exercises attempted pinch tree, however patient could not understand concept of pinching pin in order to place pin on dowel.  Transitioned to [facility] ring tree, placing and removing 4 rings on red yellow and green horizontal bars with min facilitation for sequencing and max verbal guidance.     Other ROM/Strengthening Exercises rolled pink theraputty for 25 repetitions on table to encourage functional reaching of right shoulder with max verbal guidance and min tactile cues.      Manual Therapy   Manual Therapy Myofascial release   Manual therapy comments manual therapy completed seperately from all other treatment this date   Myofascial Release myofascial release and manual stretching to right upper arm and scapular region with passive stretching to improve pain free mobility in right shoulder region in order to have improved functional use of right arm                  OT Short Term Goals - 08/21/15 1507    OT SHORT TERM GOAL #1   Title Patient will be educated on HEP for improved right shoulder mobility needed for improved use of right arm with functional activities.    Time 3   Period Weeks   Status On-going   OT SHORT TERM GOAL #2   Title Patient will improve right shoulder P/ROM to [facility] with decreased pain in order to improve comfort when donning and doffing shirts.   Time 3   Period Weeks   Status On-going   OT SHORT TERM GOAL #3   Title Patient will decrease pain in her right shoulder to 4/10 for improved ability to eat 25% of her meals with her right arm.    Time 3   Period Weeks   Status On-going           OT Long Term Goals - 08/21/15 1508    OT LONG TERM GOAL #1   Title Patient will use right arm as dominant with functional activities and transfers.   Time 6   Period Weeks   Status On-going   OT LONG TERM GOAL #2   Title Patient will have WFL A/ROM in her right shoulder in order to transfer safely from sit to stand and self feed with her right arm.    Time 6   Period Weeks   Status On-going   OT LONG TERM GOAL #3   Title Patient will have 2/10 pain or less in her right shoulder when participating in B/ADLs or transfers.   Time 6   Period Weeks   Status On-going   OT LONG TERM GOAL #4   Title Patient and caregiver will demonstrate safe transfer techniques that alleviate pressure and strain/pulling on right shoulder.   Time 6   Period Weeks   Status On-going   OT LONG TERM GOAL #5   Title Patient will be educated on  adaptive equipment that will improve safety and independence with feeding and grooming tasks.    Time 6   Period Weeks   Status On-going  Plan - 08/26/15 1422    Clinical Impression Statement A:  Patient requires max verbal guidance with exercises in order to follow through.  improved P/ROM this date.   Plan P:  Patient will reach into ADL cabinet to retrieve items with her right arm with minimal pain and minimal verbal guidance, add UBE.        Problem List Patient Active Problem List   Diagnosis Date Noted  . Urinary incontinence in female 08/13/2015  . 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 Shoulder pain, right 08/12/2015  . Pigmented nevus 05/09/2015  . Huntington's disease ([place]) 10/27/2014  . 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 Dysphagia 10/14/2014  . Dandruff 09/07/2014  . Intramural leiomyoma of uterus 06/30/2014  . Heavy menses 12/03/2013  . Falls 08/03/2011  . Lack of coordination 08/03/2011  . Muscle weakness (generalized) 08/03/2011  . HUNTINGTON'S DISEASE 03/15/2009  . Diabetes mellitus without complication ([place]) [id number]  . Unspecified visual loss 02/19/2008  . Hyperlipidemia with target LDL less than 100 07/19/2007  . Overweight (BMI 25.0-29.9) 07/19/2007  . Neurodegenerative cognitive impairment 07/19/2007  . ALLERGIC RHINITIS, SEASONAL 07/19/2007    Vangie Bicker, OTR/L [phone]  08/26/2015, 2:40 PM  [facility] [place], [place], [address] Phone: [phone]   Fax:  [phone]  Name: Victoria [name] MRN: [id number] Date of Birth: [birth year]

## 2015-08-31 ENCOUNTER — Ambulatory Visit (HOSPITAL_COMMUNITY): Payer: Medicare Other | Admitting: Specialist

## 2015-08-31 DIAGNOSIS — Z8669 Personal history of other diseases of the nervous system and sense organs: Secondary | ICD-10-CM

## 2015-08-31 DIAGNOSIS — M25611 Stiffness of right shoulder, not elsewhere classified: Secondary | ICD-10-CM | POA: Diagnosis not present

## 2015-08-31 DIAGNOSIS — M25511 Pain in right shoulder: Secondary | ICD-10-CM

## 2015-08-31 NOTE — Therapy (Signed)
[place], [place], [address] Phone: [phone]   Fax:  [phone]  Occupational Therapy Treatment  Patient Details  Name: Victoria [name] MRN: [id number] Date of Birth: Jun 02, 1971 Referring Provider: Dr. Tula Nakayama  Encounter Date: 08/31/2015      OT End of Session - 08/31/15 1439    Visit Number 4   Number of Visits 12   Date for OT Re-Evaluation 09/30/15  mini reassess on 08/30/15   Authorization Type medicare/medicaid   Authorization Time Period before 10th visit   Authorization - Visit Number 4   Authorization - Number of Visits 10   OT Start Time [phone]   OT Stop Time 1016   OT Time Calculation (min) 42 min   Activity Tolerance Patient tolerated treatment well   Behavior During Therapy [facility] for tasks assessed/performed      Past Medical History  Diagnosis Date  . Hypertension   . Obesity   . Dyslipidemia   . Mild cognitive impairment   . Perennial allergic rhinitis   . Diabetes mellitus   . Anxiety   . Huntington's disease ([place])   . Menorrhagia with regular cycle 06/02/2014  . Fibroids 06/10/2014  . Sleep apnea     cannot tolerate CPAP.    No past surgical history on file.  There were no vitals filed for this visit.  Visit Diagnosis:  Right shoulder pain  Stiffness of joint, shoulder region, right  H/O Huntington's disease      Subjective Assessment - 08/31/15 1431    Subjective  S:  It hurt   Currently in Pain? Yes   Pain Score 7    Pain Location Shoulder   Pain Orientation Right   Pain Descriptors / Indicators Aching   Pain Type Acute pain            [facility] OT Assessment - 08/31/15 0001    Assessment   Diagnosis Right Shoulder Pain   Precautions   Precautions Other (comment)   Precaution Comments patient has Huntington's disease and may have issues with her balance and impulsive movements                   OT Treatments/Exercises (OP) - 08/31/15  0001    Exercises   Exercises Shoulder   Shoulder Exercises: Supine   Protraction PROM;AROM;10 reps   Horizontal ABduction PROM;AROM;10 reps   External Rotation PROM;AROM;10 reps   Internal Rotation PROM;AROM;10 reps   Flexion PROM;AROM;10 reps   ABduction PROM;AROM;10 reps   Shoulder Exercises: Seated   Protraction AROM;10 reps   Protraction Limitations following therapist visual cues, gave therapist high 5 in front of body 10 times    External Rotation AROM;10 reps   External Rotation Limitations following therapist visual cues    Flexion AROM;10 reps   Flexion Limitations following therapist visual cues -    Abduction AROM;10 reps   ABduction Limitations following therapist visual cues    Shoulder Exercises: ROM/Strengthening   Other ROM/Strengthening Exercises completed 20 repetitions of placing large pegs in pegboard with mod vg and min tactile cues for technique, encouraged placement of pegs at top of pegboard to encourage reaching    Manual Therapy   Manual Therapy Myofascial release   Manual therapy comments manual therapy completed seperately from all other treatment this date   Myofascial Release myofascial release and manual stretching to right upper arm and scapular region with passive stretching to improve pain free mobility in right  shoulder region in order to have improved functional use of right arm                  OT Short Term Goals - 08/21/15 1507    OT SHORT TERM GOAL #1   Title Patient will be educated on HEP for improved right shoulder mobility needed for improved use of right arm with functional activities.    Time 3   Period Weeks   Status On-going   OT SHORT TERM GOAL #2   Title Patient will improve right shoulder P/ROM to [facility] with decreased pain in order to improve comfort when donning and doffing shirts.   Time 3   Period Weeks   Status On-going   OT SHORT TERM GOAL #3   Title Patient will decrease pain in her right shoulder to 4/10 for  improved ability to eat 25% of her meals with her right arm.    Time 3   Period Weeks   Status On-going           OT Long Term Goals - 08/21/15 1508    OT LONG TERM GOAL #1   Title Patient will use right arm as dominant with functional activities and transfers.   Time 6   Period Weeks   Status On-going   OT LONG TERM GOAL #2   Title Patient will have WFL A/ROM in her right shoulder in order to transfer safely from sit to stand and self feed with her right arm.    Time 6   Period Weeks   Status On-going   OT LONG TERM GOAL #3   Title Patient will have 2/10 pain or less in her right shoulder when participating in B/ADLs or transfers.   Time 6   Period Weeks   Status On-going   OT LONG TERM GOAL #4   Title Patient and caregiver will demonstrate safe transfer techniques that alleviate pressure and strain/pulling on right shoulder.   Time 6   Period Weeks   Status On-going   OT LONG TERM GOAL #5   Title Patient will be educated on adaptive equipment that will improve safety and independence with feeding and grooming tasks.    Time 6   Period Weeks   Status On-going               Plan - 08/31/15 1441    Clinical Impression Statement A:  Patient able to touch the back of her head with her right hand this date, demonstrating improved A/ROM of external rotation, patietn was not able to complte this activity approximately 2 weeks ago.     Plan P:  Patient will reach overhead into cabinet with her right arm with minimal pain and verbal guidance to retrieve household items, begin UBE.        Problem List Patient Active Problem List   Diagnosis Date Noted  . Urinary incontinence in female 08/13/2015  . 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 Shoulder pain, right 08/12/2015  . Pigmented nevus 05/09/2015  . Huntington's disease ([place]) 10/27/2014  . 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 Dysphagia 10/14/2014  . Dandruff 09/07/2014  . Intramural leiomyoma  of uterus 06/30/2014  . Heavy menses 12/03/2013  . Falls 08/03/2011  . Lack of coordination 08/03/2011  . Muscle weakness (generalized) 08/03/2011  . HUNTINGTON'S DISEASE 03/15/2009  . Diabetes mellitus without complication ([place]) [id number]  . Unspecified visual loss 02/19/2008  . Hyperlipidemia with target LDL less than 100 07/19/2007  .  Overweight (BMI 25.0-29.9) 07/19/2007  . Neurodegenerative cognitive impairment 07/19/2007  . ALLERGIC RHINITIS, SEASONAL 07/19/2007    Vangie Bicker, OTR/L [phone]  08/31/2015, 2:44 PM  [facility] [address] [place], [place], [address] Phone: [phone]   Fax:  [phone]  Name: DALAYAH STALLING MRN: [id number] Date of Birth: [birth year]

## 2015-09-01 ENCOUNTER — Other Ambulatory Visit: Payer: Self-pay | Admitting: Family Medicine

## 2015-09-03 ENCOUNTER — Ambulatory Visit (HOSPITAL_COMMUNITY): Payer: Medicare Other

## 2015-09-03 ENCOUNTER — Encounter (HOSPITAL_COMMUNITY): Payer: Self-pay

## 2015-09-03 DIAGNOSIS — M25611 Stiffness of right shoulder, not elsewhere classified: Secondary | ICD-10-CM | POA: Diagnosis not present

## 2015-09-03 DIAGNOSIS — M25511 Pain in right shoulder: Secondary | ICD-10-CM | POA: Diagnosis not present

## 2015-09-03 DIAGNOSIS — Z8669 Personal history of other diseases of the nervous system and sense organs: Secondary | ICD-10-CM | POA: Diagnosis not present

## 2015-09-03 NOTE — Therapy (Signed)
[place], [place], [address] Phone: [phone]   Fax:  [phone]  Occupational Therapy Treatment  Patient Details  Name: Victoria [name] MRN: [id number] Date of Birth: August 16, 1970 Referring Provider: Dr. Tula Nakayama  Encounter Date: 09/03/2015      OT End of Session - 09/03/15 1240    Visit Number 5   Number of Visits 12   Date for OT Re-Evaluation 09/30/15  mini reassess on 08/30/15   Authorization Type medicare/medicaid   Authorization Time Period before 10th visit   Authorization - Visit Number 5   Authorization - Number of Visits 10   OT Start Time 1020   OT Stop Time 1100   OT Time Calculation (min) 40 min   Activity Tolerance Patient tolerated treatment well   Behavior During Therapy [facility] for tasks assessed/performed      Past Medical History  Diagnosis Date  . Hypertension   . Obesity   . Dyslipidemia   . Mild cognitive impairment   . Perennial allergic rhinitis   . Diabetes mellitus   . Anxiety   . Huntington's disease ([place])   . Menorrhagia with regular cycle 06/02/2014  . Fibroids 06/10/2014  . Sleep apnea     cannot tolerate CPAP.    No past surgical history on file.  There were no vitals filed for this visit.  Visit Diagnosis:  Right shoulder pain  Stiffness of joint, shoulder region, right      Subjective Assessment - 09/03/15 1238    Subjective  S: I can't do it.    Patient is accompained by: Family member   Currently in Pain? Yes   Pain Score --  Unable to provide a number   Pain Location Shoulder   Pain Orientation Right   Pain Descriptors / Indicators Aching   Pain Type Acute pain   Pain Radiating Towards N/A            [facility] OT Assessment - 09/03/15 1038    Assessment   Diagnosis Right Shoulder Pain   Precautions   Precautions Other (comment)   Precaution Comments patient has Huntington's disease and may have issues with her balance and impulsive  movements                   OT Treatments/Exercises (OP) - 09/03/15 1038    Exercises   Exercises Shoulder   Shoulder Exercises: Supine   Protraction PROM;AROM;10 reps   Horizontal ABduction PROM;AROM;10 reps   External Rotation PROM;AROM;10 reps   Internal Rotation PROM;AROM;10 reps   Flexion PROM;AROM;10 reps   ABduction PROM;AROM;10 reps   Shoulder Exercises: Seated   Protraction AROM;10 reps   Protraction Limitations following therapist visual cues   External Rotation AROM;10 reps   External Rotation Limitations following therapist visual cues    Flexion AROM;10 reps   Flexion Limitations following therapist visual cues -    Abduction AROM;10 reps   ABduction Limitations following therapist visual cues    Functional Reaching Activities   High Level Pt completed functional reaching task in ADL kitchen in which she used her RUE to lower items from first shelf and 2nd shelf before replacing them. Patient has max difficulty with 2nd shelf items and verbalized pain when reaching to it. VC for task completion and encouragement.    Manual Therapy   Manual Therapy Myofascial release   Manual therapy comments manual therapy completed seperately from all other treatment this date   Myofascial  Release myofascial release and manual stretching to right upper arm and scapular region with passive stretching to improve pain free mobility in right shoulder region in order to have improved functional use of right arm                  OT Short Term Goals - 08/21/15 1507    OT SHORT TERM GOAL #1   Title Patient will be educated on HEP for improved right shoulder mobility needed for improved use of right arm with functional activities.    Time 3   Period Weeks   Status On-going   OT SHORT TERM GOAL #2   Title Patient will improve right shoulder P/ROM to [facility] with decreased pain in order to improve comfort when donning and doffing shirts.   Time 3   Period Weeks   Status  On-going   OT SHORT TERM GOAL #3   Title Patient will decrease pain in her right shoulder to 4/10 for improved ability to eat 25% of her meals with her right arm.    Time 3   Period Weeks   Status On-going           OT Long Term Goals - 08/21/15 1508    OT LONG TERM GOAL #1   Title Patient will use right arm as dominant with functional activities and transfers.   Time 6   Period Weeks   Status On-going   OT LONG TERM GOAL #2   Title Patient will have WFL A/ROM in her right shoulder in order to transfer safely from sit to stand and self feed with her right arm.    Time 6   Period Weeks   Status On-going   OT LONG TERM GOAL #3   Title Patient will have 2/10 pain or less in her right shoulder when participating in B/ADLs or transfers.   Time 6   Period Weeks   Status On-going   OT LONG TERM GOAL #4   Title Patient and caregiver will demonstrate safe transfer techniques that alleviate pressure and strain/pulling on right shoulder.   Time 6   Period Weeks   Status On-going   OT LONG TERM GOAL #5   Title Patient will be educated on adaptive equipment that will improve safety and independence with feeding and grooming tasks.    Time 6   Period Weeks   Status On-going               Plan - 09/03/15 1240    Clinical Impression Statement A: Patient did well with all exercises requiring verbal, visual, and tactile cues to complete. Pt has increased difficulty when reaching to 2nd shelf in kitchen cabinet. Did not add UBE due to time constraint.    Plan P: Mini reassessment and update G code. Add UBE.        Problem List Patient Active Problem List   Diagnosis Date Noted  . Urinary incontinence in female 08/13/2015  . 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 Shoulder pain, right 08/12/2015  . Pigmented nevus 05/09/2015  . Huntington's disease ([place]) 10/27/2014  . 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 Dysphagia 10/14/2014  . Dandruff 09/07/2014  .  Intramural leiomyoma of uterus 06/30/2014  . Heavy menses 12/03/2013  . Falls 08/03/2011  . Lack of coordination 08/03/2011  . Muscle weakness (generalized) 08/03/2011  . HUNTINGTON'S DISEASE 03/15/2009  . Diabetes mellitus without complication ([place]) [id number]  . Unspecified visual loss 02/19/2008  . Hyperlipidemia  with target LDL less than 100 07/19/2007  . Overweight (BMI 25.0-29.9) 07/19/2007  . Neurodegenerative cognitive impairment 07/19/2007  . ALLERGIC RHINITIS, SEASONAL 07/19/2007    Ailene Ravel, OTR/L,CBIS  [phone]  09/03/2015, 12:43 PM  [place] [address] [place], [place], [address] Phone: [phone]   Fax:  [phone]  Name: Victoria [name] MRN: [id number] Date of Birth: [birth year]

## 2015-09-07 ENCOUNTER — Encounter (HOSPITAL_COMMUNITY): Payer: Self-pay

## 2015-09-07 ENCOUNTER — Ambulatory Visit (HOSPITAL_COMMUNITY): Payer: Medicare Other

## 2015-09-07 DIAGNOSIS — Z8669 Personal history of other diseases of the nervous system and sense organs: Secondary | ICD-10-CM | POA: Diagnosis not present

## 2015-09-07 DIAGNOSIS — M25611 Stiffness of right shoulder, not elsewhere classified: Secondary | ICD-10-CM | POA: Diagnosis not present

## 2015-09-07 DIAGNOSIS — M25511 Pain in right shoulder: Secondary | ICD-10-CM | POA: Diagnosis not present

## 2015-09-07 NOTE — Therapy (Signed)
[place], [place], [address] Phone: [phone]   Fax:  [phone]  Occupational Therapy Treatment And mini reassessment. Patient Details  Name: Victoria [name] MRN: [id number] Date of Birth: [birth year] Referring Provider: Dr. Tula Nakayama  Encounter Date: 09/07/2015      OT End of Session - 09/07/15 1622    Visit Number 6   Number of Visits 12   Date for OT Re-Evaluation 09/30/15   Authorization Type medicare/medicaid   Authorization Time Period before 16th visit   Authorization - Visit Number 6   Authorization - Number of Visits 16   OT Start Time 1025  Patient arrived late for appointment. Mini reassessment.    OT Stop Time 1100   OT Time Calculation (min) 35 min   Activity Tolerance Patient tolerated treatment well   Behavior During Therapy [facility] for tasks assessed/performed      Past Medical History  Diagnosis Date  . Hypertension   . Obesity   . Dyslipidemia   . Mild cognitive impairment   . Perennial allergic rhinitis   . Diabetes mellitus   . Anxiety   . Huntington's disease ([place])   . Menorrhagia with regular cycle 06/02/2014  . Fibroids 06/10/2014  . Sleep apnea     cannot tolerate CPAP.    No past surgical history on file.  There were no vitals filed for this visit.  Visit Diagnosis:  Right shoulder pain  Stiffness of joint, shoulder region, right      Subjective Assessment - 09/07/15 1620    Subjective  S: I"ve noticed that when we bathe her she doesn't complain of pain as often.   Special Tests FOTO score: 52/100   Currently in Pain? Yes   Pain Score --  Unable to give a number.            [facility] OT Assessment - 09/07/15 1023    Assessment   Diagnosis Right Shoulder Pain   Precautions   Precautions Other (comment)   Precaution Comments patient has Huntington's disease and may have issues with her balance and impulsive movements    ROM / Strength   AROM / PROM  / Strength AROM;Strength;PROM   AROM   Overall AROM Comments assessed in seated, External rotation and internal rotation with shoulder adducted    AROM Assessment Site Shoulder   Right/Left Shoulder Right   Right Shoulder Flexion 146 Degrees  previous: 146   Right Shoulder ABduction 146 Degrees  previous: 110   Right Shoulder Internal Rotation 70 Degrees  previous: 50   Right Shoulder External Rotation 68 Degrees  previous: 50   PROM   Overall PROM Comments WFL    Strength   Overall Strength Comments difficult to assess due to cognitive issues. Strength was not assessed at evaluation.   Strength Assessment Site Shoulder   Right/Left Shoulder Right   Right Shoulder Flexion 4-/5   Right Shoulder ABduction 4-/5   Right Shoulder Internal Rotation 4-/5   Right Shoulder External Rotation 4-/5                  OT Treatments/Exercises (OP) - 09/07/15 1055    Exercises   Exercises Shoulder   Shoulder Exercises: Supine   Protraction PROM;AROM;10 reps   Horizontal ABduction PROM;AROM;10 reps   External Rotation PROM;AROM;10 reps   Internal Rotation PROM;AROM;10 reps   Flexion PROM;AROM;10 reps   ABduction PROM;AROM;10 reps   Shoulder Exercises: ROM/Strengthening   UBE (Upper  Arm Bike) level 1, forward, 1 min backwards  Consistent verbal cues, therapist provided mod assist   Manual Therapy   Manual Therapy Myofascial release   Manual therapy comments manual therapy completed seperately from all other treatment this date   Myofascial Release myofascial release and manual stretching to right upper arm and scapular region with passive stretching to improve pain free mobility in right shoulder region in order to have improved functional use of right arm                  OT Short Term Goals - 09-29-2015 1048    OT SHORT TERM GOAL #1   Title Patient will be educated on HEP for improved right shoulder mobility needed for improved use of right arm with functional  activities.    Time 3   Period Weeks   Status Achieved   OT SHORT TERM GOAL #2   Title Patient will improve right shoulder P/ROM to [facility] with decreased pain in order to improve comfort when donning and doffing shirts.   Time 3   Period Weeks   Status Achieved   OT SHORT TERM GOAL #3   Title Patient will decrease pain in her right shoulder to 4/10 for improved ability to eat 25% of her meals with her right arm.    Time 3   Period Weeks   Status Achieved           OT Long Term Goals - 09/29/2015 1050    OT LONG TERM GOAL #1   Title Patient will use right arm as dominant with functional activities and transfers.   Time 6   Period Weeks   Status On-going   OT LONG TERM GOAL #2   Title Patient will have WFL A/ROM in her right shoulder in order to transfer safely from sit to stand and self feed with her right arm.    Time 6   Period Weeks   Status On-going   OT LONG TERM GOAL #3   Title Patient will have 2/10 pain or less in her right shoulder when participating in B/ADLs or transfers.   Time 6   Period Weeks   Status On-going   OT LONG TERM GOAL #4   Title Patient and caregiver will demonstrate safe transfer techniques that alleviate pressure and strain/pulling on right shoulder.   Time 6   Period Weeks   Status On-going   OT LONG TERM GOAL #5   Title Patient will be educated on adaptive equipment that will improve safety and independence with feeding and grooming tasks.    Time 6   Period Weeks   Status On-going               Plan - 09/29/15 1624    Clinical Impression Statement A: Mini reassessment completed this date. patient has met all short term goals and is currently progressing towards long term goals. Caregiver has reported that she notices a decrease in complaints of pain when moving patient's shoulder. ROM and strength has significantly increased.    Plan P: Attempt wall wash.           G-Codes - September 29, 2015 1626    Functional Assessment Tool Used FOTO  score: 52/100 (48% impaired)   Functional Limitation Self care   Self Care Current Status (D6469) At least 40 percent but less than 60 percent impaired, limited or restricted   Self Care Goal Status (K0607) At least 20 percent but less than 40  percent impaired, limited or restricted      Problem List Patient Active Problem List   Diagnosis Date Noted  . Urinary incontinence in female 08/13/2015  . 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 Shoulder pain, right 08/12/2015  . Pigmented nevus 05/09/2015  . Huntington's disease ([place]) 10/27/2014  . 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 Dysphagia 10/14/2014  . Dandruff 09/07/2014  . Intramural leiomyoma of uterus 06/30/2014  . Heavy menses 12/03/2013  . Falls 08/03/2011  . Lack of coordination 08/03/2011  . Muscle weakness (generalized) 08/03/2011  . HUNTINGTON'S DISEASE 03/15/2009  . Diabetes mellitus without complication ([place]) 91/36/8599  . Unspecified visual loss 02/19/2008  . Hyperlipidemia with target LDL less than 100 07/19/2007  . Overweight (BMI 25.0-29.9) 07/19/2007  . Neurodegenerative cognitive impairment 07/19/2007  . ALLERGIC RHINITIS, SEASONAL 07/19/2007    Ailene Ravel, OTR/L,CBIS  [phone]  09/07/2015, 4:28 PM  [place] [address] [place], [place], [address] Phone: [phone]   Fax:  [phone]  Name: Victoria [name] MRN: [id number] Date of Birth: 02-04-1971

## 2015-09-09 ENCOUNTER — Ambulatory Visit (HOSPITAL_COMMUNITY): Payer: Medicare Other | Admitting: Occupational Therapy

## 2015-09-09 ENCOUNTER — Encounter (HOSPITAL_COMMUNITY): Payer: Self-pay | Admitting: Occupational Therapy

## 2015-09-09 DIAGNOSIS — M25511 Pain in right shoulder: Secondary | ICD-10-CM | POA: Diagnosis not present

## 2015-09-09 DIAGNOSIS — M25611 Stiffness of right shoulder, not elsewhere classified: Secondary | ICD-10-CM | POA: Diagnosis not present

## 2015-09-09 DIAGNOSIS — Z8669 Personal history of other diseases of the nervous system and sense organs: Secondary | ICD-10-CM | POA: Diagnosis not present

## 2015-09-09 NOTE — Therapy (Signed)
[place], [place], [address] Phone: [phone]   Fax:  [phone]  Occupational Therapy Treatment  Patient Details  Name: Victoria [name] MRN: [id number] Date of Birth: 10-03-1970 Referring Provider: Dr. Tula Nakayama  Encounter Date: 09/09/2015      OT End of Session - 09/09/15 1023    Visit Number 7   Number of Visits 12   Date for OT Re-Evaluation 09/30/15   Authorization Type medicare/medicaid   Authorization Time Period before 16th visit   Authorization - Visit Number 7   Authorization - Number of Visits 16   OT Start Time 0930   OT Stop Time 1013   OT Time Calculation (min) 43 min   Activity Tolerance Patient tolerated treatment well   Behavior During Therapy [facility] for tasks assessed/performed      Past Medical History  Diagnosis Date  . Hypertension   . Obesity   . Dyslipidemia   . Mild cognitive impairment   . Perennial allergic rhinitis   . Diabetes mellitus   . Anxiety   . Huntington's disease ([place])   . Menorrhagia with regular cycle 06/02/2014  . Fibroids 06/10/2014  . Sleep apnea     cannot tolerate CPAP.    No past surgical history on file.  There were no vitals filed for this visit.  Visit Diagnosis:  Right shoulder pain  Stiffness of joint, shoulder region, right      Subjective Assessment - 09/09/15 0922    Subjective  S: Hurt. (pointing to anterior shoulder region)   Currently in Pain? Yes   Pain Score --  unable to give number value   Pain Location Shoulder   Pain Orientation Right   Pain Descriptors / Indicators Aching   Pain Type Acute pain   Pain Radiating Towards n/a   Pain Onset 1 to 4 weeks ago   Pain Frequency Intermittent   Aggravating Factors  movement/stretching above shoulder level   Pain Relieving Factors heat   Effect of Pain on Daily Activities limited use of RUE during functional activities.    Multiple Pain Sites No            [facility] OT  Assessment - 09/09/15 [id number]    Assessment   Diagnosis Right Shoulder Pain   Precautions   Precautions Other (comment)   Precaution Comments patient has Huntington's disease and may have issues with her balance and impulsive movements                   OT Treatments/Exercises (OP) - 09/09/15 0934    Bed Mobility   Supine to Sit 4: Min assist;4: Min guard   Sit to Supine 3: Mod assist   Sit to Supine - Details (indicate cue type and reason) mod assist to bring legs to bed and adjust shoulders, verbal cuing to lay head & shoulders towards head of mat versus laying straight back   Transfers   Transfers Sit to Stand;Stand to Sit   Sit to Stand 5: Supervision   Sit to Stand Details (indicate cue type and reason) verbal cuing to stand still and gain balance prior to walking   Stand to Sit 4: Min guard   Comments patient tends to sit before she is fully in front of bed/chair, requirng therapist to physically assist patient into chair. max verbal guidance to reach back prior to sitting and to insure body is fully in front of chair before sitting .  Exercises   Exercises Shoulder   Shoulder Exercises: Supine   Protraction PROM;AROM;10 reps   Horizontal ABduction PROM;AROM;10 reps   External Rotation PROM;AROM;10 reps   Internal Rotation PROM;AROM;10 reps   Flexion PROM;AROM;10 reps   ABduction PROM;AROM;10 reps   Shoulder Exercises: Seated   Protraction AROM;10 reps   Protraction Limitations following therapist visual cues   External Rotation AROM;10 reps   External Rotation Limitations following therapist visual cues    Flexion AROM;10 reps   Flexion Limitations following therapist visual cues -    Abduction AROM;10 reps   ABduction Limitations following therapist visual cues    Shoulder Exercises: ROM/Strengthening   UBE (Upper Arm Bike) level 1, 1'30"  forward, 1'30"  backwards  consistent verbal cuing, mod assist from OT   Wall Wash 1'    Manual Therapy   Manual Therapy  Myofascial release   Manual therapy comments manual therapy completed seperately from all other treatment this date   Myofascial Release myofascial release and manual stretching to right upper arm and scapular region with passive stretching to improve pain free mobility in right shoulder region in order to have improved functional use of right arm                  OT Short Term Goals - 09/07/15 1048    OT SHORT TERM GOAL #1   Title Patient will be educated on HEP for improved right shoulder mobility needed for improved use of right arm with functional activities.    Time 3   Period Weeks   Status Achieved   OT SHORT TERM GOAL #2   Title Patient will improve right shoulder P/ROM to [facility] with decreased pain in order to improve comfort when donning and doffing shirts.   Time 3   Period Weeks   Status Achieved   OT SHORT TERM GOAL #3   Title Patient will decrease pain in her right shoulder to 4/10 for improved ability to eat 25% of her meals with her right arm.    Time 3   Period Weeks   Status Achieved           OT Long Term Goals - 09/07/15 1050    OT LONG TERM GOAL #1   Title Patient will use right arm as dominant with functional activities and transfers.   Time 6   Period Weeks   Status On-going   OT LONG TERM GOAL #2   Title Patient will have WFL A/ROM in her right shoulder in order to transfer safely from sit to stand and self feed with her right arm.    Time 6   Period Weeks   Status On-going   OT LONG TERM GOAL #3   Title Patient will have 2/10 pain or less in her right shoulder when participating in B/ADLs or transfers.   Time 6   Period Weeks   Status On-going   OT LONG TERM GOAL #4   Title Patient and caregiver will demonstrate safe transfer techniques that alleviate pressure and strain/pulling on right shoulder.   Time 6   Period Weeks   Status On-going   OT LONG TERM GOAL #5   Title Patient will be educated on adaptive equipment that will improve  safety and independence with feeding and grooming tasks.    Time 6   Period Weeks   Status On-going               Plan - 09/09/15 1023  Clinical Impression Statement A: Added wall wash, pt able to complete with min difficulty, reaching to top of doorframe. Increased UBE to 1'30", mod assist from OT. Caregiver reports pt sleeps on her back & complains of back pain in the mornings, however does not remain on her side when placed/propped up by caregiver. Pt has hospital bed, discussed adjusting head and feel to provide support for her back, trying for short periods and determining if pt experiences any relief.     Plan P: continue with functional reaching at table height and shoulder height. Pt caregiver reports she does not reach for anything above table height at home at baseline.         Problem List Patient Active Problem List   Diagnosis Date Noted  . Urinary incontinence in female 08/13/2015  . 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 Shoulder pain, right 08/12/2015  . Pigmented nevus 05/09/2015  . Huntington's disease ([place]) 10/27/2014  . 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 Dysphagia 10/14/2014  . Dandruff 09/07/2014  . Intramural leiomyoma of uterus 06/30/2014  . Heavy menses 12/03/2013  . Falls 08/03/2011  . Lack of coordination 08/03/2011  . Muscle weakness (generalized) 08/03/2011  . HUNTINGTON'S DISEASE 03/15/2009  . Diabetes mellitus without complication ([place]) [id number]  . Unspecified visual loss 02/19/2008  . Hyperlipidemia with target LDL less than 100 07/19/2007  . Overweight (BMI 25.0-29.9) 07/19/2007  . Neurodegenerative cognitive impairment 07/19/2007  . ALLERGIC RHINITIS, SEASONAL 07/19/2007    Guadelupe Sabin, OTR/L  [phone]  09/09/2015, 10:30 AM  [place], [place], [address] Phone: [phone]   Fax:  [phone]  Name: Victoria [name] MRN:  [id number] Date of Birth: 05/02/1971

## 2015-09-14 ENCOUNTER — Ambulatory Visit (HOSPITAL_COMMUNITY): Payer: Medicare Other | Admitting: Specialist

## 2015-09-14 DIAGNOSIS — Z8669 Personal history of other diseases of the nervous system and sense organs: Secondary | ICD-10-CM

## 2015-09-14 DIAGNOSIS — M25511 Pain in right shoulder: Secondary | ICD-10-CM

## 2015-09-14 DIAGNOSIS — M25611 Stiffness of right shoulder, not elsewhere classified: Secondary | ICD-10-CM

## 2015-09-14 NOTE — Therapy (Signed)
[place], [place], [address] Phone: [phone]   Fax:  [phone]  Occupational Therapy Treatment  Patient Details  Name: Victoria [name] MRN: [id number] Date of Birth: [birth year] Referring Provider: Dr. Tula Nakayama  Encounter Date: 09/14/2015      OT End of Session - 09/14/15 1315    Visit Number 8   Number of Visits 12   Date for OT Re-Evaluation 09/30/15   Authorization Type medicare/medicaid   Authorization Time Period before 16th visit   Authorization - Visit Number 8   Authorization - Number of Visits 16   OT Start Time [phone]   OT Stop Time 1020   OT Time Calculation (min) 39 min   Activity Tolerance Patient tolerated treatment well   Behavior During Therapy [facility] for tasks assessed/performed      Past Medical History  Diagnosis Date  . Hypertension   . Obesity   . Dyslipidemia   . Mild cognitive impairment   . Perennial allergic rhinitis   . Diabetes mellitus   . Anxiety   . Huntington's disease ([place])   . Menorrhagia with regular cycle 06/02/2014  . Fibroids 06/10/2014  . Sleep apnea     cannot tolerate CPAP.    No past surgical history on file.  There were no vitals filed for this visit.  Visit Diagnosis:  Right shoulder pain  Stiffness of joint, shoulder region, right  H/O Huntington's disease      Subjective Assessment - 09/14/15 1311    Subjective  S:  Water   Currently in Pain? Yes   Pain Score --  no value   Pain Location Shoulder   Pain Orientation Right   Pain Descriptors / Indicators Aching   Pain Type Acute pain   Pain Onset 1 to 4 weeks ago   Pain Frequency Intermittent   Aggravating Factors  movement/stretching above shoulder level   Pain Relieving Factors heat   Effect of Pain on Daily Activities limited use of RUE during functional activities             [facility] OT Assessment - 09/14/15 0001    Assessment   Diagnosis Right Shoulder Pain                   OT Treatments/Exercises (OP) - 09/14/15 0001    Exercises   Exercises Shoulder;Hand;Wrist   Shoulder Exercises: Supine   Protraction PROM;AROM;10 reps   Horizontal ABduction PROM;AROM;10 reps   External Rotation PROM;AROM;10 reps   Internal Rotation PROM;AROM;10 reps   Flexion PROM;AROM;10 reps   ABduction PROM;AROM;10 reps   Shoulder Exercises: ROM/Strengthening   UBE (Upper Arm Bike) level 1, 1'30"  forward, 1'30"  backwards max pa from therapist to continue to propel the bike and to remain on task    [place] 2'   Proximal Shoulder Strengthening, Supine 10 times with min pa from OTR/L for technique and to continue exercise   Other ROM/Strengthening Exercises completed box and block activity this date.  Patient completed in 11'12" this session without rest breaks and without verbal guidance to remain on task.  Previously, on 08/21/15 patient completed in same exact amount of time, however, we stopped the timer 2 times for a rest break and patient required constant vg to stay on task.    Manual Therapy   Manual Therapy Myofascial release   Manual therapy comments manual therapy completed seperately from all other treatment this date   Myofascial Release  myofascial release and manual stretching to right upper arm and scapular region with passive stretching to improve pain free mobility in right shoulder region in order to have improved functional use of right arm                  OT Short Term Goals - 09/07/15 1048    OT SHORT TERM GOAL #1   Title Patient will be educated on HEP for improved right shoulder mobility needed for improved use of right arm with functional activities.    Time 3   Period Weeks   Status Achieved   OT SHORT TERM GOAL #2   Title Patient will improve right shoulder P/ROM to [facility] with decreased pain in order to improve comfort when donning and doffing shirts.   Time 3   Period Weeks   Status Achieved   OT SHORT TERM GOAL #3    Title Patient will decrease pain in her right shoulder to 4/10 for improved ability to eat 25% of her meals with her right arm.    Time 3   Period Weeks   Status Achieved           OT Long Term Goals - 09/07/15 1050    OT LONG TERM GOAL #1   Title Patient will use right arm as dominant with functional activities and transfers.   Time 6   Period Weeks   Status On-going   OT LONG TERM GOAL #2   Title Patient will have WFL A/ROM in her right shoulder in order to transfer safely from sit to stand and self feed with her right arm.    Time 6   Period Weeks   Status On-going   OT LONG TERM GOAL #3   Title Patient will have 2/10 pain or less in her right shoulder when participating in B/ADLs or transfers.   Time 6   Period Weeks   Status On-going   OT LONG TERM GOAL #4   Title Patient and caregiver will demonstrate safe transfer techniques that alleviate pressure and strain/pulling on right shoulder.   Time 6   Period Weeks   Status On-going   OT LONG TERM GOAL #5   Title Patient will be educated on adaptive equipment that will improve safety and independence with feeding and grooming tasks.    Time 6   Period Weeks   Status On-going               Plan - 09/14/15 1316    Clinical Impression Statement A:  Patient able to complete box and block test in same amount of time however did not require rest breaks or verbal guidance to remain on task, signifying improved right shoulder functional use.  Improved P/ROM this date with less complaints of pain in right shoulder region.     Plan P:  Functional reaching from waist to mid shoulder height in order to improve safey and independence with reaching to don and doff shirts at home.         Problem List Patient Active Problem List   Diagnosis Date Noted  . Urinary incontinence in female 08/13/2015  . 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 Shoulder pain, right 08/12/2015  . Pigmented nevus 05/09/2015  . Huntington's  disease ([place]) 10/27/2014  . 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 Dysphagia 10/14/2014  . Dandruff 09/07/2014  . Intramural leiomyoma of uterus 06/30/2014  . Heavy menses 12/03/2013  . Falls 08/03/2011  . Lack  of coordination 08/03/2011  . Muscle weakness (generalized) 08/03/2011  . HUNTINGTON'S DISEASE 03/15/2009  . Diabetes mellitus without complication ([place]) [id number]  . Unspecified visual loss 02/19/2008  . Hyperlipidemia with target LDL less than 100 07/19/2007  . Overweight (BMI 25.0-29.9) 07/19/2007  . Neurodegenerative cognitive impairment 07/19/2007  . ALLERGIC RHINITIS, SEASONAL 07/19/2007    Vangie Bicker, OTR/L [phone]  09/14/2015, 1:18 PM  [place], [place], [address] Phone: [phone]   Fax:  [phone]  Name: Victoria [name] MRN: [id number] Date of Birth: 1971/05/31

## 2015-09-16 ENCOUNTER — Ambulatory Visit (HOSPITAL_COMMUNITY): Payer: Medicare Other

## 2015-09-16 DIAGNOSIS — M25511 Pain in right shoulder: Secondary | ICD-10-CM | POA: Diagnosis not present

## 2015-09-16 DIAGNOSIS — Z8669 Personal history of other diseases of the nervous system and sense organs: Secondary | ICD-10-CM | POA: Diagnosis not present

## 2015-09-16 DIAGNOSIS — M25611 Stiffness of right shoulder, not elsewhere classified: Secondary | ICD-10-CM

## 2015-09-16 NOTE — Therapy (Signed)
[place], [place], [address] Phone: [phone]   Fax:  [phone]  Occupational Therapy Treatment  Patient Details  Name: Victoria [name] MRN: [id number] Date of Birth: November 25, 1970 Referring Provider: Dr. Tula Nakayama  Encounter Date: 09/16/2015      OT End of Session - 09/16/15 1118    Visit Number 9   Number of Visits 12   Date for OT Re-Evaluation 09/30/15   Authorization Type medicare/medicaid   Authorization Time Period before 16th visit   Authorization - Visit Number 9   Authorization - Number of Visits 16   OT Start Time 1020   OT Stop Time 1100   OT Time Calculation (min) 40 min   Activity Tolerance Patient tolerated treatment well   Behavior During Therapy [facility] for tasks assessed/performed      Past Medical History  Diagnosis Date  . Hypertension   . Obesity   . Dyslipidemia   . Mild cognitive impairment   . Perennial allergic rhinitis   . Diabetes mellitus   . Anxiety   . Huntington's disease ([place])   . Menorrhagia with regular cycle 06/02/2014  . Fibroids 06/10/2014  . Sleep apnea     cannot tolerate CPAP.    No past surgical history on file.  There were no vitals filed for this visit.  Visit Diagnosis:  Right shoulder pain  Stiffness of joint, shoulder region, right                    OT Treatments/Exercises (OP) - 09/16/15 1027    Exercises   Exercises Shoulder;Hand;Wrist   Shoulder Exercises: Supine   Protraction PROM;AROM;10 reps   Horizontal ABduction PROM;AROM;10 reps   External Rotation PROM;AROM;10 reps   Internal Rotation PROM;AROM;10 reps   Flexion PROM;AROM;10 reps   ABduction PROM;AROM;10 reps   Shoulder Exercises: ROM/Strengthening   UBE (Upper Arm Bike) level 1, 1'30"  forward, 1'30"  backwards max physical asssistance from therapist to continue to propel the bike and to remain on task    Proximal Shoulder Strengthening, Supine 10 times  with max physical assistance from OTR/L for technique and to continue exercise   Other ROM/Strengthening Exercises Completed range of motion arc activity without difficulty. Therapist added tubing to increase height and this was also completed without difficulty. Therapist provided mod verbal cues for using right hand only. Patient demonstrated moderate difficulty returning rings from right to left, therapist provided tactile and verbal cues to turn her hand at the midpoint of the arc to return the rings to the bottom.    Functional Reaching Activities   Mid Level Patient completed function reaching activity standing at table and moving small containers to a shelf at shoulder height and back down to the table. Completed with Mod verbal cues and therapist holding left hand.   Manual Therapy   Manual Therapy Passive ROM   Manual therapy comments manual therapy completed seperately from all other treatment this date                  OT Short Term Goals - 09/16/15 1412    OT SHORT TERM GOAL #1   Title Patient will be educated on HEP for improved right shoulder mobility needed for improved use of right arm with functional activities.    Time 3   Period Weeks   OT SHORT TERM GOAL #2   Title Patient will improve right shoulder P/ROM to [facility] with decreased pain  in order to improve comfort when donning and doffing shirts.   Time 3   Period Weeks   OT SHORT TERM GOAL #3   Title Patient will decrease pain in her right shoulder to 4/10 for improved ability to eat 25% of her meals with her right arm.    Time 3   Period Weeks           OT Long Term Goals - 09/07/15 1050    OT LONG TERM GOAL #1   Title Patient will use right arm as dominant with functional activities and transfers.   Time 6   Period Weeks   Status On-going   OT LONG TERM GOAL #2   Title Patient will have WFL A/ROM in her right shoulder in order to transfer safely from sit to stand and self feed with her right arm.     Time 6   Period Weeks   Status On-going   OT LONG TERM GOAL #3   Title Patient will have 2/10 pain or less in her right shoulder when participating in B/ADLs or transfers.   Time 6   Period Weeks   Status On-going   OT LONG TERM GOAL #4   Title Patient and caregiver will demonstrate safe transfer techniques that alleviate pressure and strain/pulling on right shoulder.   Time 6   Period Weeks   Status On-going   OT LONG TERM GOAL #5   Title Patient will be educated on adaptive equipment that will improve safety and independence with feeding and grooming tasks.    Time 6   Period Weeks   Status On-going               Plan - 09/16/15 1118    Clinical Impression Statement A: Patient able to complete functional reaching activity at shoulder level without difficulty or complaint of pain. No complaints of pain during passive stretching or A/ROM exercises. Patient's performance in A/ROM exercises is limited by decreaed comprehension and gross motor coordination due to Huntington's Disease.    Plan P: Functional reaching activity from waist to overhead level to reduce pain during participation in dressing and baithing.         Problem List Patient Active Problem List   Diagnosis Date Noted  . Urinary incontinence in female 08/13/2015  . 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 Shoulder pain, right 08/12/2015  . Pigmented nevus 05/09/2015  . Huntington's disease ([place]) 10/27/2014  . 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 Dysphagia 10/14/2014  . Dandruff 09/07/2014  . Intramural leiomyoma of uterus 06/30/2014  . Heavy menses 12/03/2013  . Falls 08/03/2011  . Lack of coordination 08/03/2011  . Muscle weakness (generalized) 08/03/2011  . HUNTINGTON'S DISEASE 03/15/2009  . Diabetes mellitus without complication ([place]) [id number]  . Unspecified visual loss 02/19/2008  . Hyperlipidemia with target LDL less than 100 07/19/2007  . Overweight (BMI 25.0-29.9)  07/19/2007  . Neurodegenerative cognitive impairment 07/19/2007  . ALLERGIC RHINITIS, SEASONAL 07/19/2007    Marijo Conception, OTA student [phone]   09/16/2015, 2:13 PM  [place] [address] [place], [place], [address] Phone: [phone]   Fax:  [phone]  Name: JOSLYNN JOSHUA MRN: [id number] Date of Birth: 11/09/1970  Ailene Ravel, OTR/L,CBIS  [phone]  This entire session was guided, instructed, and directly supervised by Ailene Ravel, OTR/L, CBIS.

## 2015-09-21 ENCOUNTER — Ambulatory Visit (HOSPITAL_COMMUNITY): Payer: Medicare Other | Attending: Family Medicine

## 2015-09-21 ENCOUNTER — Encounter (HOSPITAL_COMMUNITY): Payer: Self-pay

## 2015-09-21 DIAGNOSIS — R29898 Other symptoms and signs involving the musculoskeletal system: Secondary | ICD-10-CM | POA: Insufficient documentation

## 2015-09-21 DIAGNOSIS — M25611 Stiffness of right shoulder, not elsewhere classified: Secondary | ICD-10-CM | POA: Diagnosis not present

## 2015-09-21 DIAGNOSIS — M25511 Pain in right shoulder: Secondary | ICD-10-CM | POA: Insufficient documentation

## 2015-09-21 DIAGNOSIS — Z8669 Personal history of other diseases of the nervous system and sense organs: Secondary | ICD-10-CM | POA: Insufficient documentation

## 2015-09-21 NOTE — Therapy (Signed)
[place], [place], [address] Phone: [phone]   Fax:  [phone]  Occupational Therapy Treatment  Patient Details  Name: Victoria [name] MRN: [id number] Date of Birth: December 28, 1970 Referring Provider: Dr. Tula Nakayama  Encounter Date: 09/21/2015      OT End of Session - 09/21/15 1105    Visit Number 10   Number of Visits 12   Date for OT Re-Evaluation 09/30/15   Authorization Type medicare/medicaid   Authorization Time Period before 16th visit   Authorization - Visit Number 10   Authorization - Number of Visits 16   OT Start Time 1025  Patient arrived late   OT Stop Time 1100   OT Time Calculation (min) 35 min   Activity Tolerance Patient tolerated treatment well   Behavior During Therapy [facility] for tasks assessed/performed      Past Medical History  Diagnosis Date  . Hypertension   . Obesity   . Dyslipidemia   . Mild cognitive impairment   . Perennial allergic rhinitis   . Diabetes mellitus   . Anxiety   . Huntington's disease ([place])   . Menorrhagia with regular cycle 06/02/2014  . Fibroids 06/10/2014  . Sleep apnea     cannot tolerate CPAP.    No past surgical history on file.  There were no vitals filed for this visit.  Visit Diagnosis:  Right shoulder pain  Stiffness of joint, shoulder region, right  H/O Huntington's disease      Subjective Assessment - 09/21/15 1026    Subjective  S: Good.   Currently in Pain? No/denies            [facility] OT Assessment - 09/21/15 1027    Assessment   Diagnosis Right Shoulder Pain   Precautions   Precautions Other (comment)   Precaution Comments patient has Huntington's disease and may have issues with her balance and impulsive movements                   OT Treatments/Exercises (OP) - 09/21/15 1027    Exercises   Exercises Shoulder   Shoulder Exercises: Supine   Protraction PROM;AROM;10 reps   Horizontal ABduction  PROM;AROM;10 reps   External Rotation PROM;AROM;10 reps   Internal Rotation PROM;AROM;10 reps   Flexion PROM;AROM;10 reps   ABduction PROM;AROM;10 reps   Shoulder Exercises: ROM/Strengthening   Proximal Shoulder Strengthening, Supine 10 times with max physical assistance from therapist for technique and to continue exercise   Functional Reaching Activities   Mid Level Patient completed functional reaching activity, placing large pegs in peg board seated at table. Therapist provided mod visual cues for completion of task.   High Level Patient completed functional reaching task in treatment room placing items on shelf overhead and bringing them back down to waist level.  Therapist provided mod visual cues for task completion.   Manual Therapy   Manual Therapy Myofascial release   Manual therapy comments manual therapy completed seperately from all other treatment this date   Myofascial Release myofascial release and manual stretching to right upper arm and scapular region with passive stretching to improve pain free mobility in right shoulder region in order to have improved functional use of right arm                  OT Short Term Goals - 09/16/15 1412    OT SHORT TERM GOAL #1   Title Patient will be educated on HEP for improved  right shoulder mobility needed for improved use of right arm with functional activities.    Time 3   Period Weeks   OT SHORT TERM GOAL #2   Title Patient will improve right shoulder P/ROM to [facility] with decreased pain in order to improve comfort when donning and doffing shirts.   Time 3   Period Weeks   OT SHORT TERM GOAL #3   Title Patient will decrease pain in her right shoulder to 4/10 for improved ability to eat 25% of her meals with her right arm.    Time 3   Period Weeks           OT Long Term Goals - 09/07/15 1050    OT LONG TERM GOAL #1   Title Patient will use right arm as dominant with functional activities and transfers.   Time 6    Period Weeks   Status On-going   OT LONG TERM GOAL #2   Title Patient will have WFL A/ROM in her right shoulder in order to transfer safely from sit to stand and self feed with her right arm.    Time 6   Period Weeks   Status On-going   OT LONG TERM GOAL #3   Title Patient will have 2/10 pain or less in her right shoulder when participating in B/ADLs or transfers.   Time 6   Period Weeks   Status On-going   OT LONG TERM GOAL #4   Title Patient and caregiver will demonstrate safe transfer techniques that alleviate pressure and strain/pulling on right shoulder.   Time 6   Period Weeks   Status On-going   OT LONG TERM GOAL #5   Title Patient will be educated on adaptive equipment that will improve safety and independence with feeding and grooming tasks.    Time 6   Period Weeks   Status On-going               Plan - 09/21/15 1105    Clinical Impression Statement A: Patient completes functional reaching activity from waist level to shelf overhead with min difficulty. When bringing items from shelf back down to waist level, therapist notes tremors and fatigue of the LUE. No complaints of pain during passive stretching or A/ROM exercises. Patient's performance in A/ROM exercises is limited by decreased comprehension and gross motor coordination due to Huntington's Disease/   Plan P: Complete manual therapy PRN. Continue with functional reaching task at waist level and overhead to reduce pain during participation in dressing, bathing, and other daily tasks.        Problem List Patient Active Problem List   Diagnosis Date Noted  . Urinary incontinence in female 08/13/2015  . 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 Shoulder pain, right 08/12/2015  . Pigmented nevus 05/09/2015  . Huntington's disease ([place]) 10/27/2014  . 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 Dysphagia 10/14/2014  . Dandruff 09/07/2014  . Intramural leiomyoma of uterus 06/30/2014  . Heavy  menses 12/03/2013  . Falls 08/03/2011  . Lack of coordination 08/03/2011  . Muscle weakness (generalized) 08/03/2011  . HUNTINGTON'S DISEASE 03/15/2009  . Diabetes mellitus without complication ([place]) [id number]  . Unspecified visual loss 02/19/2008  . Hyperlipidemia with target LDL less than 100 07/19/2007  . Overweight (BMI 25.0-29.9) 07/19/2007  . Neurodegenerative cognitive impairment 07/19/2007  . ALLERGIC RHINITIS, SEASONAL 07/19/2007    Marijo Conception, OTA student [phone]  09/21/2015, 11:11 AM  [place] [address]  [place], [place], [address] Phone: [phone]   Fax:  [phone]  Name: Victoria [name] MRN: [id number] Date of Birth: 1971-06-15  Ailene Ravel, OTR/L,CBIS  [phone]  This entire session was guided, instructed, and directly supervised by Ailene Ravel, OTR/L, CBIS.

## 2015-09-23 ENCOUNTER — Ambulatory Visit (HOSPITAL_COMMUNITY): Payer: Medicare Other | Admitting: Specialist

## 2015-09-23 DIAGNOSIS — Z8669 Personal history of other diseases of the nervous system and sense organs: Secondary | ICD-10-CM

## 2015-09-23 DIAGNOSIS — M25511 Pain in right shoulder: Secondary | ICD-10-CM | POA: Diagnosis not present

## 2015-09-23 DIAGNOSIS — R29898 Other symptoms and signs involving the musculoskeletal system: Secondary | ICD-10-CM | POA: Diagnosis not present

## 2015-09-23 DIAGNOSIS — M25611 Stiffness of right shoulder, not elsewhere classified: Secondary | ICD-10-CM

## 2015-09-23 NOTE — Therapy (Signed)
[place] [place], [place], [address] Phone: [phone]   Fax:  [phone]  Occupational Therapy Treatment  Patient Details  Name: Victoria [name] MRN: [id number] Date of Birth: Apr 19, 1971 Referring Provider: Dr. Tula Nakayama  Encounter Date: 09/23/2015      OT End of Session - 09/23/15 1633    Visit Number 11   Number of Visits 12   Date for OT Re-Evaluation 09/30/15   Authorization Type medicare/medicaid   Authorization Time Period before 16th visit   Authorization - Visit Number 11   Authorization - Number of Visits 16   OT Start Time 1022   OT Stop Time 1101   OT Time Calculation (min) 39 min   Activity Tolerance Patient tolerated treatment well   Behavior During Therapy [facility] for tasks assessed/performed      Past Medical History  Diagnosis Date  . Hypertension   . Obesity   . Dyslipidemia   . Mild cognitive impairment   . Perennial allergic rhinitis   . Diabetes mellitus   . Anxiety   . Huntington's disease ([place])   . Menorrhagia with regular cycle 06/02/2014  . Fibroids 06/10/2014  . Sleep apnea     cannot tolerate CPAP.    No past surgical history on file.  There were no vitals filed for this visit.  Visit Diagnosis:  Right shoulder pain  Stiffness of joint, shoulder region, right  H/O Huntington's disease      Subjective Assessment - 09/23/15 1624    Subjective  S:  Like   Currently in Pain? No/denies            [facility] OT Assessment - 09/23/15 0001    Assessment   Diagnosis Right Shoulder Pain   Precautions   Precautions Other (comment)   Precaution Comments patient has Huntington's disease and may have issues with her balance and impulsive movements    Functional Reaching Activities   High Level Pt completed functional reaching task in ADL kitchen in which she used her RUE to lower items from first shelf and 2nd shelf before replacing them. Patient has max difficulty with  2nd shelf items and verbalized pain when reaching to it. VC for task completion and encouragement X 10 minutes                   OT Treatments/Exercises (OP) - 09/23/15 0001    ADLs   ADL Comments patient folded 6 washclothes, 6 pillow cases, and 6 towels with max verbal guidance and mod pa    Shoulder Exercises: ROM/Strengthening   UBE (Upper Arm Bike) level 1 3' forward and 3' reverse wtih mod pa and max verbal guidance to complete activity                  OT Short Term Goals - 09/16/15 1412    OT SHORT TERM GOAL #1   Title Patient will be educated on HEP for improved right shoulder mobility needed for improved use of right arm with functional activities.    Time 3   Period Weeks   OT SHORT TERM GOAL #2   Title Patient will improve right shoulder P/ROM to [facility] with decreased pain in order to improve comfort when donning and doffing shirts.   Time 3   Period Weeks   OT SHORT TERM GOAL #3   Title Patient will decrease pain in her right shoulder to 4/10 for improved ability to eat 25% of her  meals with her right arm.    Time 3   Period Weeks           OT Long Term Goals - 09/07/15 1050    OT LONG TERM GOAL #1   Title Patient will use right arm as dominant with functional activities and transfers.   Time 6   Period Weeks   Status On-going   OT LONG TERM GOAL #2   Title Patient will have WFL A/ROM in her right shoulder in order to transfer safely from sit to stand and self feed with her right arm.    Time 6   Period Weeks   Status On-going   OT LONG TERM GOAL #3   Title Patient will have 2/10 pain or less in her right shoulder when participating in B/ADLs or transfers.   Time 6   Period Weeks   Status On-going   OT LONG TERM GOAL #4   Title Patient and caregiver will demonstrate safe transfer techniques that alleviate pressure and strain/pulling on right shoulder.   Time 6   Period Weeks   Status On-going   OT LONG TERM GOAL #5   Title Patient  will be educated on adaptive equipment that will improve safety and independence with feeding and grooming tasks.    Time 6   Period Weeks   Status On-going               Plan - 09/23/15 1634    Clinical Impression Statement A:  patient completed overhead reaching activity with min difficulty.  patient became fatigued with UBE activity as noted with tremors and rest breaks.  Requires max vg for completing activities.    Plan P:  Discuss ADL completion with caregiver and focus on improving A/ROM for these tasks.         Problem List Patient Active Problem List   Diagnosis Date Noted  . Urinary incontinence in female 08/13/2015  . 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 Shoulder pain, right 08/12/2015  . Pigmented nevus 05/09/2015  . Huntington's disease ([place]) 10/27/2014  . 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 Dysphagia 10/14/2014  . Dandruff 09/07/2014  . Intramural leiomyoma of uterus 06/30/2014  . Heavy menses 12/03/2013  . Falls 08/03/2011  . Lack of coordination 08/03/2011  . Muscle weakness (generalized) 08/03/2011  . HUNTINGTON'S DISEASE 03/15/2009  . Diabetes mellitus without complication ([place]) [id number]  . Unspecified visual loss 02/19/2008  . Hyperlipidemia with target LDL less than 100 07/19/2007  . Overweight (BMI 25.0-29.9) 07/19/2007  . Neurodegenerative cognitive impairment 07/19/2007  . ALLERGIC RHINITIS, SEASONAL 07/19/2007    Vangie Bicker, OTR/L [phone]  09/23/2015, 4:36 PM  [facility] [address] [place], [place], [address] Phone: [phone]   Fax:  [phone]  Name: Victoria [name] MRN: [id number] Date of Birth: 22-Nov-1970

## 2015-09-28 ENCOUNTER — Ambulatory Visit (HOSPITAL_COMMUNITY): Payer: Medicare Other | Admitting: Specialist

## 2015-09-28 DIAGNOSIS — M25611 Stiffness of right shoulder, not elsewhere classified: Secondary | ICD-10-CM

## 2015-09-28 DIAGNOSIS — Z8669 Personal history of other diseases of the nervous system and sense organs: Secondary | ICD-10-CM | POA: Diagnosis not present

## 2015-09-28 DIAGNOSIS — M25511 Pain in right shoulder: Secondary | ICD-10-CM

## 2015-09-28 DIAGNOSIS — R29898 Other symptoms and signs involving the musculoskeletal system: Secondary | ICD-10-CM | POA: Diagnosis not present

## 2015-09-28 NOTE — Therapy (Signed)
[place], [place], [address] Phone: [phone]   Fax:  [phone]  Occupational Therapy Treatment  Patient Details  Name: Victoria [name] MRN: [id number] Date of Birth: March 21, 1971 Referring Provider: Dr. Tula Nakayama  Encounter Date: 09/28/2015      OT End of Session - 09/28/15 1106    Visit Number 12   Number of Visits 12   Date for OT Re-Evaluation 09/30/15   Authorization Type medicare/medicaid   Authorization Time Period before 16th visit   Authorization - Visit Number 12   Authorization - Number of Visits 16   OT Start Time 1040   OT Stop Time 1115  arrived late for scheduled appointment   OT Time Calculation (min) 35 min   Activity Tolerance Patient tolerated treatment well   Behavior During Therapy [facility] for tasks assessed/performed      Past Medical History  Diagnosis Date  . Hypertension   . Obesity   . Dyslipidemia   . Mild cognitive impairment   . Perennial allergic rhinitis   . Diabetes mellitus   . Anxiety   . Huntington's disease ([place])   . Menorrhagia with regular cycle 06/02/2014  . Fibroids 06/10/2014  . Sleep apnea     cannot tolerate CPAP.    No past surgical history on file.  There were no vitals filed for this visit.      Subjective Assessment - 09/28/15 1052    Subjective  S;  Head hurt ( attempted pinch tree, patient having max difficulty with technique stated that head hurt - perhaps as a diversion to the task (does this frequently at home per caregiver.  Caregiver states that Victoria [name] is able to reach her hands overhead when getting dressed with much more ease and less complaints of pain.    Currently in Pain? No/denies   Pain Score 0-No pain            [facility] OT Assessment - 09/28/15 0001    Assessment   Diagnosis Right Shoulder Pain   Precautions   Precautions Other (comment)   Precaution Comments patient has Huntington's disease and may have issues with  her balance and impulsive movements                   OT Treatments/Exercises (OP) - 09/28/15 0001    Exercises   Exercises Shoulder   Shoulder Exercises: Supine   Protraction PROM;AROM;10 reps   Shoulder Exercises: ROM/Strengthening   Sustained Retraction with Theraband strengthening of bilateral upper extremities by flattening yellow theraputty, requires max verbal guidance and tactile cues to stay on task    Other ROM/Strengthening Exercises completed box and block activity this date. Patient completed in 11'12" this session without rest breaks and without verbal guidance to remain on task. Previously, on 09/16/15 patient completed in same exact amount of time.   Other ROM/Strengthening Exercises attempted graduated pinch tree with yellow easy resistance clothespins.  Patient requires max verbal guidance and physical assistance to operate clothes pin.  completed 5 repetiitons and then stated her head hurt.                    OT Short Term Goals - 09/16/15 1412    OT SHORT TERM GOAL #1   Title Patient will be educated on HEP for improved right shoulder mobility needed for improved use of right arm with functional activities.    Time 3   Period Weeks   OT  SHORT TERM GOAL #2   Title Patient will improve right shoulder P/ROM to [facility] with decreased pain in order to improve comfort when donning and doffing shirts.   Time 3   Period Weeks   OT SHORT TERM GOAL #3   Title Patient will decrease pain in her right shoulder to 4/10 for improved ability to eat 25% of her meals with her right arm.    Time 3   Period Weeks           OT Long Term Goals - 09/07/15 1050    OT LONG TERM GOAL #1   Title Patient will use right arm as dominant with functional activities and transfers.   Time 6   Period Weeks   Status On-going   OT LONG TERM GOAL #2   Title Patient will have WFL A/ROM in her right shoulder in order to transfer safely from sit to stand and self feed with her  right arm.    Time 6   Period Weeks   Status On-going   OT LONG TERM GOAL #3   Title Patient will have 2/10 pain or less in her right shoulder when participating in B/ADLs or transfers.   Time 6   Period Weeks   Status On-going   OT LONG TERM GOAL #4   Title Patient and caregiver will demonstrate safe transfer techniques that alleviate pressure and strain/pulling on right shoulder.   Time 6   Period Weeks   Status On-going   OT LONG TERM GOAL #5   Title Patient will be educated on adaptive equipment that will improve safety and independence with feeding and grooming tasks.    Time 6   Period Weeks   Status On-going               Plan - 09/28/15 1116    Clinical Impression Statement A: continues to take exactly 11'12" to complete box and block test. required increased verbal guidance to stay on task with novel tasks this date.   OT Frequency 2x / week   OT Duration 2 weeks   OT Treatment/Interventions Self-care/ADL training;Cryotherapy;Moist Heat;Ultrasound;Therapeutic exercise;Neuromuscular education;DME and/or AE instruction;Passive range of motion;Manual Therapy;Therapist, nutritional;Therapeutic exercises;Therapeutic activities;Patient/family education;Electrical Stimulation   Plan P: Reassess/recertification for possible discharge from skilled OT interventions.       Patient will benefit from skilled therapeutic intervention in order to improve the following deficits and impairments:     Visit Diagnosis: Pain in right shoulder - Plan: Ot plan of care cert/re-cert  Stiffness of right shoulder, not elsewhere classified - Plan: Ot plan of care cert/re-cert  Other symptoms and signs involving the musculoskeletal system - Plan: Ot plan of care cert/re-cert    Problem List Patient Active Problem List   Diagnosis Date Noted  . Urinary incontinence in female 08/13/2015  . 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 Shoulder pain, right 08/12/2015  . Pigmented  nevus 05/09/2015  . Huntington's disease ([place]) 10/27/2014  . 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 Dysphagia 10/14/2014  . Dandruff 09/07/2014  . Intramural leiomyoma of uterus 06/30/2014  . Heavy menses 12/03/2013  . Falls 08/03/2011  . Lack of coordination 08/03/2011  . Muscle weakness (generalized) 08/03/2011  . HUNTINGTON'S DISEASE 03/15/2009  . Diabetes mellitus without complication ([place]) [id number]  . Unspecified visual loss 02/19/2008  . Hyperlipidemia with target LDL less than 100 07/19/2007  . Overweight (BMI 25.0-29.9) 07/19/2007  . Neurodegenerative cognitive impairment 07/19/2007  . ALLERGIC RHINITIS, SEASONAL 07/19/2007  Vangie Bicker, OTR/L [phone]  09/28/2015, 11:18 AM  [facility] [place], [place], [address] Phone: [phone]   Fax:  [phone]  Name: Victoria [name] MRN: [id number] Date of Birth: 20-Aug-1970

## 2015-09-30 ENCOUNTER — Ambulatory Visit (HOSPITAL_COMMUNITY): Payer: Medicare Other | Admitting: Specialist

## 2015-09-30 DIAGNOSIS — M25611 Stiffness of right shoulder, not elsewhere classified: Secondary | ICD-10-CM | POA: Diagnosis not present

## 2015-09-30 DIAGNOSIS — M25511 Pain in right shoulder: Secondary | ICD-10-CM | POA: Diagnosis not present

## 2015-09-30 DIAGNOSIS — Z8669 Personal history of other diseases of the nervous system and sense organs: Secondary | ICD-10-CM | POA: Diagnosis not present

## 2015-09-30 DIAGNOSIS — R29898 Other symptoms and signs involving the musculoskeletal system: Secondary | ICD-10-CM | POA: Diagnosis not present

## 2015-09-30 NOTE — Therapy (Signed)
[place], [place], [address] Phone: [phone]   Fax:  [phone]  Occupational Therapy Treatment  Patient Details  Name: Victoria [name] MRN: [id number] Date of Birth: [birth year] Referring Provider: Dr. Tula Nakayama  Encounter Date: 09/30/2015      OT End of Session - 09/30/15 1108    Visit Number 13   Number of Visits 13   Authorization Type medicare/medicaid   Authorization Time Period before 16th visit   Authorization - Visit Number 16   Authorization - Number of Visits 16   OT Start Time 2500  dc visit   OT Stop Time 1105   OT Time Calculation (min) 20 min   Activity Tolerance Patient tolerated treatment well   Behavior During Therapy [facility] for tasks assessed/performed      Past Medical History  Diagnosis Date  . Hypertension   . Obesity   . Dyslipidemia   . Mild cognitive impairment   . Perennial allergic rhinitis   . Diabetes mellitus   . Anxiety   . Huntington's disease ([place])   . Menorrhagia with regular cycle 06/02/2014  . Fibroids 06/10/2014  . Sleep apnea     cannot tolerate CPAP.    No past surgical history on file.  There were no vitals filed for this visit.      Subjective Assessment - 09/30/15 1105    Subjective  S:  Good water   Currently in Pain? No/denies            [facility] OT Assessment - 09/30/15 0001    Observation/Other Assessments   Focus on Therapeutic Outcomes (FOTO)  59/100   AROM   Overall AROM Comments assessed in seated, External rotation and internal rotation with shoulder adducted  (09/07/15)   Right Shoulder Flexion 160 Degrees  146   Right Shoulder ABduction 160 Degrees  146   Right Shoulder Internal Rotation 70 Degrees  70   Right Shoulder External Rotation 75 Degrees  68   Strength   Overall Strength Comments (09/07/15)   Right Shoulder Flexion 4/5  4-/5   Right Shoulder ABduction 4/5  4-/5   Right Shoulder Internal Rotation 4/5  4-/5    Right Shoulder External Rotation 4/5  4-/5           OT Education - 09/30/15 1108    Education provided Yes   Education Details updated HEP for swinging arms while walking, rolling pin on table for shoudler flexion, shoudler extension, and elbow flexion/extension   Person(s) Educated Theatre stage manager)   Methods Explanation;Demonstration;Handout   Comprehension Verbalized understanding          OT Short Term Goals - 09/30/15 1053    OT SHORT TERM GOAL #1   Title Patient will be educated on HEP for improved right shoulder mobility needed for improved use of right arm with functional activities.    Time 3   Period Weeks   Status Achieved   OT SHORT TERM GOAL #2   Title Patient will improve right shoulder P/ROM to [facility] with decreased pain in order to improve comfort when donning and doffing shirts.   Time 3   Period Weeks   Status Achieved   OT SHORT TERM GOAL #3   Title Patient will decrease pain in her right shoulder to 4/10 for improved ability to eat 25% of her meals with her right arm.    Time 3   Period Weeks   Status Achieved  OT Long Term Goals - 10/28/15 1054    OT LONG TERM GOAL #1   Title Patient will use right arm as dominant with functional activities and transfers.   Time 6   Period Weeks   Status Achieved   OT LONG TERM GOAL #2   Title Patient will have WFL A/ROM in her right shoulder in order to transfer safely from sit to stand and self feed with her right arm.    Time 6   Period Weeks   Status Achieved   OT LONG TERM GOAL #3   Title Patient will have 2/10 pain or less in her right shoulder when participating in B/ADLs or transfers.   Time 6   Period Weeks   Status Achieved   OT LONG TERM GOAL #4   Title Patient and caregiver will demonstrate safe transfer techniques that alleviate pressure and strain/pulling on right shoulder.   Time 6   Period Weeks   Status Achieved   OT LONG TERM GOAL #5   Title Patient will be educated on adaptive  equipment that will improve safety and independence with feeding and grooming tasks.    Time 6   Period Weeks   Status Achieved               Plan - 10-28-2015 1109    Clinical Impression Statement A:  Victoria [name] has made significant improvements in her right shoulder mobility and strength.  She no longer complains of pain in her  right shoulder and will complete ADL's such as dressing and grooming without prompting.  Prior to therapy she would request assistance due to pain.  Patient has met all OT goals.     OT Treatment/Interventions Self-care/ADL training;Cryotherapy;Moist Heat;Ultrasound;Therapeutic exercise;Neuromuscular education;DME and/or AE instruction;Passive range of motion;Manual Therapy;Therapist, nutritional;Therapeutic exercises;Therapeutic activities;Patient/family education;Electrical Stimulation   Plan P:  DC from skilled OT intervention this date.    Consulted and Agree with Plan of Care Patient;Family member/caregiver      Patient will benefit from skilled therapeutic intervention in order to improve the following deficits and impairments:  Decreased range of motion, Decreased safety awareness, Increased fascial restricitons, Increased muscle spasms, Pain  Visit Diagnosis: Pain in right shoulder  Stiffness of right shoulder, not elsewhere classified  Other symptoms and signs involving the musculoskeletal system      G-Codes - Oct 28, 2015 1110    Functional Assessment Tool Used FOTO 59/100 (41% impaired)   Functional Limitation Self care   Self Care Goal Status (R4270) At least 20 percent but less than 40 percent impaired, limited or restricted   Self Care Discharge Status [phone]) At least 40 percent but less than 60 percent impaired, limited or restricted      Problem List Patient Active Problem List   Diagnosis Date Noted  . Urinary incontinence in female 08/13/2015  . 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 Shoulder pain, right 08/12/2015  . Pigmented  nevus 05/09/2015  . Huntington's disease ([place]) 10/27/2014  . 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 Dysphagia 10/14/2014  . Dandruff 09/07/2014  . Intramural leiomyoma of uterus 06/30/2014  . Heavy menses 12/03/2013  . Falls 08/03/2011  . Lack of coordination 08/03/2011  . Muscle weakness (generalized) 08/03/2011  . HUNTINGTON'S DISEASE 03/15/2009  . Diabetes mellitus without complication ([place]) 28/31/5176  . Unspecified visual loss 02/19/2008  . Hyperlipidemia with target LDL less than 100 07/19/2007  . Overweight (BMI 25.0-29.9) 07/19/2007  . Neurodegenerative cognitive impairment 07/19/2007  . ALLERGIC RHINITIS, SEASONAL  07/19/2007    Vangie Bicker, OTR/L [phone]  09/30/2015, 11:12 AM  OCCUPATIONAL THERAPY DISCHARGE SUMMARY  Visits from Start of Care: 13  Current functional level related to goals / functional outcomes: Improved independence with ADL's with less pain   Remaining deficits: n/a   Education / Equipment: Shoulder and elbow A/ROM, walking tips  Plan: Patient agrees to discharge.  Patient goals were met. Patient is being discharged due to meeting the stated rehab goals.  ?????         Vangie Bicker, OTR/L [facility] [address] [place], [place], [address] Phone: [phone]   Fax:  [phone]  Name: Victoria [name] MRN: [id number] Date of Birth: May 28, 1971  .

## 2015-09-30 NOTE — Patient Instructions (Signed)
SELF ASSISTED WITH OBJECT: Shoulder Flexion / Elbow Extension (Rolling Pin)    Place hands on rolling pin, straighten arms. __10_ reps per set, __4_ sets per day, __7_ days per week   Copyright  VHI. All rights reserved.  Extension (Active)    Bring arm back as far as possible. For a greater challenge, do this while lying down on stomach. Repeat __10__ times. Do __2__ sessions per day.  Copyright  VHI. All rights reserved.  Elevation (Active)    Shrug shoulders up, breathing in. Relax, breathing out. Repeat ___10_ times. Do ___2_ sessions per day.  Copyright  VHI. All rights reserved.  Walking: Phase 1    Swing arms when walking Copyright  VHI. All rights reserved.  AROM: Elbow Flexion / Extension    With left hand palm up, gently bend elbow as far as possible. Then straighten arm as far as possible. Repeat ___10_ times per set. Do ___1_ sets per session. Do _4__ sessions per day.  Copyright  VHI. All rights reserved.

## 2015-10-05 ENCOUNTER — Other Ambulatory Visit: Payer: Self-pay | Admitting: Family Medicine

## 2015-10-13 ENCOUNTER — Encounter: Payer: Self-pay | Admitting: *Deleted

## 2015-10-13 ENCOUNTER — Ambulatory Visit (INDEPENDENT_AMBULATORY_CARE_PROVIDER_SITE_OTHER): Payer: Medicare Other | Admitting: *Deleted

## 2015-10-13 DIAGNOSIS — Z3042 Encounter for surveillance of injectable contraceptive: Secondary | ICD-10-CM | POA: Diagnosis not present

## 2015-10-13 MED ORDER — MEDROXYPROGESTERONE ACETATE 150 MG/ML IM SUSP
150.0000 mg | Freq: Once | INTRAMUSCULAR | Status: AC
  Administered 2015-10-13: 150 mg via INTRAMUSCULAR

## 2015-10-13 NOTE — Progress Notes (Signed)
Pt here for Depo. Pt is not sexually active so no pregnancy test was done. Pt tolerated shot well. Return in 12 weeks for next shot. JSY 

## 2015-10-20 ENCOUNTER — Other Ambulatory Visit: Payer: Self-pay | Admitting: Family Medicine

## 2015-10-22 ENCOUNTER — Other Ambulatory Visit: Payer: Self-pay | Admitting: Family Medicine

## 2015-12-10 ENCOUNTER — Ambulatory Visit (INDEPENDENT_AMBULATORY_CARE_PROVIDER_SITE_OTHER): Payer: Medicare Other | Admitting: Family Medicine

## 2015-12-10 ENCOUNTER — Encounter: Payer: Self-pay | Admitting: Family Medicine

## 2015-12-10 ENCOUNTER — Encounter (INDEPENDENT_AMBULATORY_CARE_PROVIDER_SITE_OTHER): Payer: Self-pay

## 2015-12-10 VITALS — BP 120/80 | HR 80 | Resp 16 | Ht 64.0 in | Wt 144.0 lb

## 2015-12-10 DIAGNOSIS — Z0000 Encounter for general adult medical examination without abnormal findings: Secondary | ICD-10-CM

## 2015-12-10 DIAGNOSIS — E559 Vitamin D deficiency, unspecified: Secondary | ICD-10-CM

## 2015-12-10 DIAGNOSIS — Z09 Encounter for follow-up examination after completed treatment for conditions other than malignant neoplasm: Secondary | ICD-10-CM | POA: Insufficient documentation

## 2015-12-10 DIAGNOSIS — E785 Hyperlipidemia, unspecified: Secondary | ICD-10-CM

## 2015-12-10 DIAGNOSIS — E119 Type 2 diabetes mellitus without complications: Secondary | ICD-10-CM

## 2015-12-10 NOTE — Patient Instructions (Signed)
F/u early November with rectal, call if you need me sooner  Fasting labs first week in August , we will mail order form to you  Keep doing what you are doing!  Thank you  for choosing [facility]. We consider it a privelige to serve you.  Delivering excellent health care in a caring and  compassionate way is our goal.  Partnering with you,  so that together we can achieve this goal is our strategy.

## 2015-12-10 NOTE — Assessment & Plan Note (Addendum)
Annual exam as documented.  Regular seat belt use and home safety, is also discussed. Changes in health habits are decided on by the patient with goals and time frames  set for achieving them. Immunization and cancer screening needs are specifically addressed at this visit.  

## 2015-12-10 NOTE — Progress Notes (Signed)
Subjective:    Patient ID: Victoria [name], female    DOB: 1970/10/10, 45 y.o.   MRN: [id number]  HPI Preventive Screening-Counseling & Management   Patient present here today for a Medicare annual wellness visit.   Current Problems (verified)   Medications Prior to Visit Allergies (verified)   PAST HISTORY  Family History (verified)  Social History Disabled due to Huntington's disease, 1 son, no nicotine or drug use     Risk Factors  Current exercise habits:  None currently but given a list of chair exercises  Dietary issues discussed: relies on family for meals, encouraged to limit fried fatty foods and carbs     Cardiac risk factors: type 2 dm   Depression Screen . No crying spells noted (Note: if answer to either of the following is "Yes", a more complete depression screening is indicated)   Over the past two weeks, have you felt down, depressed or hopeless? No  Over the past two weeks, have you felt little interest or pleasure in doing things? No  Have you lost interest or pleasure in daily life? No  Do you often feel hopeless? No  Do you cry easily over simple problems? No   Activities of Daily Living  In your present state of health, do you have any difficulty performing the following activities?  Driving?: incapable Managing money?: yes Feeding yourself?:yes Getting from bed to chair?:Needs assistance Climbing a flight of stairs?: yes due to balance problems Preparing food and eating?: relies on family to prepare meals  Bathing or showering?:yes Getting dressed?:yes Getting to the toilet?:Needs assistance Using the toilet?:yes Moving around from place to place?: needs assistance due to balance issues  Fall Risk Assessment In the past year have you fallen or had a near fall?:mutiple near falls Are you currently taking any medications that make you dizzy?:No   Hearing Difficulties:  Do you often ask people to speak up or repeat themselves?:Not  applicable, but no gross loss of hearing noted Do you experience ringing or noises in your ears?:No Do you have difficulty understanding soft or whispered voices?:No  Cognitive Testing : unable to assess, 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 Yes Normal Appearance?no blunted expression    Advanced Directives have been discussed with the patient?Yes, brochure given at last AWV   List the Names of Other Physician/Practitioners you currently use: (updated)none   Indicate any recent Medical Services you may have received from other than [facility] providers in the past year (date may be approximate).   Assessment:    Annual Wellness Exam   Plan:    Patient Instructions (the written plan) was given to the patient.  Medicare Attestation  I have personally reviewed:  The patient's medical and social history  Their use of alcohol, tobacco or illicit drugs  Their current medications and supplements  The patient's functional ability including ADLs,fall risks, home safety risks, cognitive, and hearing and visual impairment  Diet and physical activities  Evidence for depression or mood disorders  The patient's weight, height, BMI, and visual acuity have been recorded in the chart. I have made referrals, counseling, and provided education to the patient based on review of the above and I have provided the patient with a written personalized care plan for preventive services.      Review of Systems     Objective:   Physical Exam BP 120/80 mmHg  Pulse 80  Resp 16  Ht 5\' 4"  (1.626 m)  Wt 144 lb (65.318  kg)  BMI 24.71 kg/m2  SpO2 100%        Assessment & Plan:  Medicare annual wellness visit, subsequent Annual exam as documented.  Regular seat belt use and home safety, is also discussed. Changes in health habits are decided on by the patient with goals and time frames  set for achieving them. Immunization and cancer screening needs are specifically addressed at this  visit.

## 2015-12-23 ENCOUNTER — Other Ambulatory Visit: Payer: Self-pay | Admitting: Family Medicine

## 2015-12-31 ENCOUNTER — Emergency Department (HOSPITAL_COMMUNITY)
Admission: EM | Admit: 2015-12-31 | Discharge: 2015-12-31 | Disposition: A | Discharge status: Home / Self Care | Payer: Medicare Other | Attending: Emergency Medicine | Admitting: Emergency Medicine

## 2015-12-31 ENCOUNTER — Encounter (HOSPITAL_COMMUNITY): Payer: Self-pay | Admitting: *Deleted

## 2015-12-31 DIAGNOSIS — S0083XA Contusion of other part of head, initial encounter: Secondary | ICD-10-CM | POA: Diagnosis not present

## 2015-12-31 DIAGNOSIS — E119 Type 2 diabetes mellitus without complications: Secondary | ICD-10-CM | POA: Insufficient documentation

## 2015-12-31 DIAGNOSIS — Z6824 Body mass index (BMI) 24.0-24.9, adult: Secondary | ICD-10-CM | POA: Diagnosis not present

## 2015-12-31 DIAGNOSIS — S0993XA Unspecified injury of face, initial encounter: Secondary | ICD-10-CM | POA: Diagnosis present

## 2015-12-31 DIAGNOSIS — E669 Obesity, unspecified: Secondary | ICD-10-CM | POA: Insufficient documentation

## 2015-12-31 DIAGNOSIS — Y999 Unspecified external cause status: Secondary | ICD-10-CM | POA: Insufficient documentation

## 2015-12-31 DIAGNOSIS — S01511A Laceration without foreign body of lip, initial encounter: Secondary | ICD-10-CM | POA: Diagnosis not present

## 2015-12-31 DIAGNOSIS — I10 Essential (primary) hypertension: Secondary | ICD-10-CM | POA: Diagnosis not present

## 2015-12-31 DIAGNOSIS — E785 Hyperlipidemia, unspecified: Secondary | ICD-10-CM | POA: Insufficient documentation

## 2015-12-31 DIAGNOSIS — Y92009 Unspecified place in unspecified non-institutional (private) residence as the place of occurrence of the external cause: Secondary | ICD-10-CM | POA: Diagnosis not present

## 2015-12-31 DIAGNOSIS — W1839XA Other fall on same level, initial encounter: Secondary | ICD-10-CM | POA: Diagnosis not present

## 2015-12-31 DIAGNOSIS — S00511A Abrasion of lip, initial encounter: Secondary | ICD-10-CM

## 2015-12-31 DIAGNOSIS — Y939 Activity, unspecified: Secondary | ICD-10-CM | POA: Diagnosis not present

## 2015-12-31 MED ORDER — ACETAMINOPHEN 325 MG PO TABS
650.0000 mg | ORAL_TABLET | Freq: Once | ORAL | Status: AC
  Administered 2015-12-31: 650 mg via ORAL
  Filled 2015-12-31: qty 2

## 2015-12-31 NOTE — ED Notes (Signed)
Pt grunts when moving right elbow and points to area when asked about pain. Bruising noted to left hand, cut to lip is not actively bleeding.

## 2015-12-31 NOTE — ED Notes (Signed)
Pt fell today when she lost her balance, family reports that pt has Huntington disease and will lose her balance, pt has bruising noted to left cheekbone and laceration to bottom lip, bleeding controled at triage, denies any LOC,

## 2015-12-31 NOTE — Discharge Instructions (Signed)
Abrasion An abrasion is a cut or scrape on the surface of your skin. An abrasion does not go through all of the layers of your skin. It is important to take good care of your abrasion to prevent infection. HOME CARE Medicines  Take or apply medicines only as told by your doctor.  If you were prescribed an antibiotic ointment, finish all of it even if you start to feel better. Wound Care  Clean the wound with mild soap and water 2-3 times per day or as told by your doctor. Pat your wound dry with a clean towel. Do not rub it.  There are many ways to close and cover a wound. Follow instructions from your doctor about:  How to take care of your wound.  When and how you should change your bandage (dressing).  When and how you should take off your dressing.  Check your wound every day for signs of infection. Watch for:  Redness, swelling, or pain.  Fluid, blood, or pus. General Instructions  Keep the dressing dry as told by your doctor. Do not take baths, swim, use a hot tub, or do anything that would put your wound underwater until your doctor says it is okay.  If there is swelling, raise (elevate) the injured area above the level of your heart while you are sitting or lying down.  Keep all follow-up visits as told by your doctor. This is important. GET HELP IF:  You were given a tetanus shot and you have any of these where the needle went in:  Swelling.  Very bad pain.  Redness.  Bleeding.  Medicine does not help your pain.  You have any of these at the site of the wound:  More redness.  More swelling.  More pain. GET HELP RIGHT AWAY IF:  You have a red streak going away from your wound.  You have a fever.  You have fluid, blood, or pus coming from your wound.  There is a bad smell coming from your wound.   This information is not intended to replace advice given to you by your health care provider. Make sure you discuss any questions you have with your  health care provider.   Document Released: 11/23/2007 Document Revised: 10/21/2014 Document Reviewed: 06/04/2014 Elsevier Interactive Patient Education 2016 [place] A contusion is a deep bruise. Contusions happen when an injury causes bleeding under the skin. Symptoms of bruising include pain, swelling, and discolored skin. The skin may turn blue, purple, or yellow. HOME CARE   Rest the injured area.  If told, put ice on the injured area.  Put ice in a plastic bag.  Place a towel between your skin and the bag.  Leave the ice on for 20 minutes, 2-3 times per day.  If told, put light pressure (compression) on the injured area using an elastic bandage. Make sure the bandage is not too tight. Remove it and put it back on as told by your doctor.  If possible, raise (elevate) the injured area above the level of your heart while you are sitting or lying down.  Take over-the-counter and prescription medicines only as told by your doctor. GET HELP IF:  Your symptoms do not get better after several days of treatment.  Your symptoms get worse.  You have trouble moving the injured area. GET HELP RIGHT AWAY IF:   You have very bad pain.  You have a loss of feeling (numbness) in a hand or foot.  Your hand or foot turns pale or cold.   This information is not intended to replace advice given to you by your health care provider. Make sure you discuss any questions you have with your health care provider.   Document Released: 11/23/2007 Document Revised: 02/25/2015 Document Reviewed: 10/22/2014 Elsevier Interactive Patient Education [employer].

## 2016-01-01 NOTE — ED Provider Notes (Signed)
CSN: [id number]     Arrival date & time 12/31/15  1923 History   First MD Initiated Contact with Patient 12/31/15 1945     Chief Complaint  Patient presents with  . Fall     (Consider location/radiation/quality/duration/timing/severity/associated sxs/prior Treatment) HPI  Victoria [name] is a 45 y.o. female with hx of Huntington's disease, HTN and mild cognitive impairment, and non-communicative per family, presents to the Emergency Department after a fall in her home.  Patient's aunt states that she falls frequently due to her Huntington's dz, and fell earlier onto the wooden floor, landing on her left side, left face and lacerated her lip.  Mother of the patient denies LOC, family denies vomiting, decreased activity.     Past Medical History  Diagnosis Date  . Hypertension   . Obesity   . Dyslipidemia   . Mild cognitive impairment   . Perennial allergic rhinitis   . Diabetes mellitus   . Anxiety   . Huntington's disease ([place])   . Menorrhagia with regular cycle 06/02/2014  . Fibroids 06/10/2014  . Sleep apnea     cannot tolerate CPAP.   History reviewed. No pertinent past surgical history. Family History  Problem Relation Age of Onset  . Huntington's disease Father   . Cancer      family history   . Diabetes      family history   . Heart defect      family history   . Diabetes Mother   . Hypertension Mother   . Hyperlipidemia Mother   . Huntington's disease Brother   . Alzheimer's disease Maternal Grandmother    Social History  Substance Use Topics  . Smoking status: Never Smoker   . Smokeless tobacco: Never Used  . Alcohol Use: No   OB History    Gravida Para Term Preterm AB TAB SAB Ectopic Multiple Living   1 1 1       1      Review of Systems  Constitutional: Negative for fever and chills.  HENT:       Laceration lower lip  Gastrointestinal: Negative for vomiting.  Musculoskeletal: Positive for myalgias. Negative for back pain, joint swelling,  arthralgias and neck pain.  Skin: Negative for color change and wound.  Neurological: Negative for syncope, weakness and headaches.  Psychiatric/Behavioral: Negative for confusion.  All other systems reviewed and are negative.     Allergies  Diphenhydramine hcl  Home Medications   Prior to Admission medications   Medication Sig Start Date End Date Taking? Authorizing Provider  acetaminophen (TYLENOL) 500 MG tablet Take 500 mg by mouth daily as needed for mild pain. As needed    Historical Provider, MD  citalopram (CELEXA) 20 MG tablet TAKE ONE (1) TABLET BY MOUTH EVERY DAY 08/12/15   Fayrene Helper, MD  Clobetasol Propionate (CLOBEX) 0.05 % shampoo Apply topically twice daily Patient taking differently: Apply topically twice daily prn 03/19/13   Fayrene Helper, MD  ferrous sulfate 324 (65 FE) MG TBEC Take 1 tablet by mouth daily.     Historical Provider, MD  gabapentin (NEURONTIN) 300 MG capsule Take 1 capsule (300 mg total) by mouth every 12 (twelve) hours. 08/12/15   Fayrene Helper, MD  galantamine (RAZADYNE ER) 24 MG 24 hr capsule TAKE ONE CAPSULE BY MOUTH DAILY 07/07/15   Fayrene Helper, MD  galantamine (RAZADYNE ER) 24 MG 24 hr capsule TAKE ONE CAPSULE BY MOUTH DAILY 12/23/15   Fayrene Helper, MD  lovastatin (MEVACOR) 20 MG tablet TAKE ONE (1) TABLET AT BEDTIME 08/12/15   Fayrene Helper, MD  medroxyPROGESTERone (DEPO-PROVERA) 150 MG/ML injection Inject 1 mL (150 mg total) into the muscle every 3 (three) months. 02/02/15   Christin Fudge, CNM  memantine (NAMENDA) 10 MG tablet TAKE ONE TABLET BY MOUTH TWICE A DAY 10/22/15   Fayrene Helper, MD  Multiple Vitamin (MULTIVITAMIN PO) Take by mouth daily.      Historical Provider, MD  Omega-3 Fatty Acids (FISH OIL) 1000 MG CAPS Take by mouth 2 (two) times daily.      Historical Provider, MD  traZODone (DESYREL) 50 MG tablet TAKE ONE TABLET BY MOUTH EVERY NIGHT AT BEDTIME 10/06/15   Fayrene Helper, MD   BP  136/85 mmHg  Pulse 90  Temp(Src) 98 F (36.7 C) (Oral)  Resp 18  Ht 5\' 4"  (1.626 m)  Wt 65.318 kg  BMI 24.71 kg/m2  SpO2 100% Physical Exam  Constitutional: She is oriented to person, place, and time. She appears well-nourished. No distress.  HENT:  Head: Atraumatic.  Right Ear: Tympanic membrane and ear canal normal.  Left Ear: Tympanic membrane and ear canal normal.  Nose: No nasal deformity. No epistaxis.  Mouth/Throat: Uvula is midline, oropharynx is clear and moist and mucous membranes are normal.    Abrasion to the left lower lip.  Mild edema.  No dental injuries  Eyes: Conjunctivae and EOM are normal. Pupils are equal, round, and reactive to light.  Neck: Normal range of motion and full passive range of motion without pain.  Cardiovascular: Normal rate, regular rhythm and intact distal pulses.   Pulmonary/Chest: Effort normal and breath sounds normal. No respiratory distress.  Musculoskeletal: Normal range of motion. She exhibits no edema.  Full ROM of the bilateral upper and lower extremities, no edema or bony deformities  Neurological: She is alert and oriented to person, place, and time. Coordination normal.  Skin: Skin is warm.  Psychiatric: She has a normal mood and affect.  Nursing note and vitals reviewed.   ED Course  Procedures (including critical care time) Labs Review Labs Reviewed - No data to display  Imaging Review No results found. I have personally reviewed and evaluated these images and lab results as part of my medical decision-making.   EKG Interpretation None      MDM   Final diagnoses:  Abrasion of lip, initial encounter  Facial contusion, initial encounter    Pt at activity and cognitive baseline per family members.  Moving all extremities well.  NV intact.  Small abrasion to left lower lip, no indication for sutures.  Doubt fx's.  Family reassured.  Given return precautions.  Stable for d/c    Kem Parkinson, PA-C 01/02/16  1224  Fredia Sorrow, MD 01/03/16 1554

## 2016-01-05 ENCOUNTER — Other Ambulatory Visit: Payer: Self-pay | Admitting: *Deleted

## 2016-01-05 ENCOUNTER — Ambulatory Visit (INDEPENDENT_AMBULATORY_CARE_PROVIDER_SITE_OTHER): Payer: Medicare Other | Admitting: *Deleted

## 2016-01-05 ENCOUNTER — Encounter: Payer: Self-pay | Admitting: *Deleted

## 2016-01-05 DIAGNOSIS — Z3042 Encounter for surveillance of injectable contraceptive: Secondary | ICD-10-CM

## 2016-01-05 DIAGNOSIS — N920 Excessive and frequent menstruation with regular cycle: Secondary | ICD-10-CM

## 2016-01-05 MED ORDER — MEDROXYPROGESTERONE ACETATE 150 MG/ML IM SUSP
150.0000 mg | Freq: Once | INTRAMUSCULAR | Status: AC
  Administered 2016-01-05: 150 mg via INTRAMUSCULAR

## 2016-01-05 MED ORDER — MEDROXYPROGESTERONE ACETATE 150 MG/ML IM SUSP: 150.0000 mg | INTRAMUSCULAR | Status: DC

## 2016-01-05 NOTE — Progress Notes (Signed)
Patient ID: Victoria [name], female   DOB: Feb 26, 1971, 45 y.o.   MRN: [id number] Depo Provera 150 mg IM given right deltoid with no complications. Pt is not sexually active, no pregnancy test done. Pt to return in 12 weeks for next injection.

## 2016-02-23 ENCOUNTER — Other Ambulatory Visit: Payer: Self-pay | Admitting: Family Medicine

## 2016-03-16 DIAGNOSIS — E119 Type 2 diabetes mellitus without complications: Secondary | ICD-10-CM | POA: Diagnosis not present

## 2016-03-16 DIAGNOSIS — E559 Vitamin D deficiency, unspecified: Secondary | ICD-10-CM | POA: Diagnosis not present

## 2016-03-16 LAB — CBC
HCT: 41.3 % (ref 35.0–45.0)
HEMOGLOBIN: 13.3 g/dL (ref 11.7–15.5)
MCH: 28.3 pg (ref 27.0–33.0)
MCHC: 32.2 g/dL (ref 32.0–36.0)
MCV: 87.9 fL (ref 80.0–100.0)
MPV: 11 fL (ref 7.5–12.5)
Platelets: 262 10*3/uL (ref 140–400)
RBC: 4.7 MIL/uL (ref 3.80–5.10)
RDW: 14 % (ref 11.0–15.0)
WBC: 5.3 10*3/uL (ref 3.8–10.8)

## 2016-03-17 ENCOUNTER — Encounter: Payer: Self-pay | Admitting: Family Medicine

## 2016-03-17 LAB — COMPLETE METABOLIC PANEL WITH GFR
ALBUMIN: 4 g/dL (ref 3.6–5.1)
ALK PHOS: 46 U/L (ref 33–115)
ALT: 9 U/L (ref 6–29)
AST: 11 U/L (ref 10–30)
BILIRUBIN TOTAL: 0.5 mg/dL (ref 0.2–1.2)
BUN: 9 mg/dL (ref 7–25)
CALCIUM: 10 mg/dL (ref 8.6–10.2)
CO2: 27 mmol/L (ref 20–31)
Chloride: 108 mmol/L (ref 98–110)
Creat: 1.13 mg/dL — ABNORMAL HIGH (ref 0.50–1.10)
GFR, EST AFRICAN AMERICAN: 68 mL/min (ref >=60)
GFR, EST NON AFRICAN AMERICAN: 59 mL/min — AB (ref >=60)
Glucose, Bld: 91 mg/dL (ref 65–99)
Potassium: 4.4 mmol/L (ref 3.5–5.3)
Sodium: 143 mmol/L (ref 135–146)
TOTAL PROTEIN: 6.8 g/dL (ref 6.1–8.1)

## 2016-03-17 LAB — HEMOGLOBIN A1C
HEMOGLOBIN A1C: 5.5 % (ref <5.7)
Mean Plasma Glucose: 111 mg/dL

## 2016-03-17 LAB — VITAMIN D 25 HYDROXY (VIT D DEFICIENCY, FRACTURES): Vit D, 25-Hydroxy: 36 ng/mL (ref 30–100)

## 2016-03-29 ENCOUNTER — Encounter: Payer: Self-pay | Admitting: *Deleted

## 2016-03-29 ENCOUNTER — Ambulatory Visit (INDEPENDENT_AMBULATORY_CARE_PROVIDER_SITE_OTHER): Payer: Medicare Other | Admitting: *Deleted

## 2016-03-29 DIAGNOSIS — Z3042 Encounter for surveillance of injectable contraceptive: Secondary | ICD-10-CM

## 2016-03-29 DIAGNOSIS — Z308 Encounter for other contraceptive management: Secondary | ICD-10-CM | POA: Diagnosis not present

## 2016-03-29 DIAGNOSIS — Z3202 Encounter for pregnancy test, result negative: Secondary | ICD-10-CM | POA: Diagnosis not present

## 2016-03-29 LAB — POCT URINE PREGNANCY: Preg Test, Ur: NEGATIVE

## 2016-03-29 MED ORDER — MEDROXYPROGESTERONE ACETATE 150 MG/ML IM SUSP
150.0000 mg | Freq: Once | INTRAMUSCULAR | Status: AC
  Administered 2016-03-29: 150 mg via INTRAMUSCULAR

## 2016-03-29 NOTE — Progress Notes (Signed)
Depo Provera 150 mg IM given left deltoid with no complications, negative pregnancy test. Pt to return in 12 weeks for next injection.

## 2016-04-19 ENCOUNTER — Other Ambulatory Visit: Payer: Self-pay | Admitting: Family Medicine

## 2016-05-04 ENCOUNTER — Ambulatory Visit (INDEPENDENT_AMBULATORY_CARE_PROVIDER_SITE_OTHER): Payer: Medicare Other | Admitting: Family Medicine

## 2016-05-04 ENCOUNTER — Encounter: Payer: Self-pay | Admitting: Family Medicine

## 2016-05-04 VITALS — BP 120/82 | HR 82 | Resp 16 | Ht 64.0 in | Wt 142.0 lb

## 2016-05-04 DIAGNOSIS — Z23 Encounter for immunization: Secondary | ICD-10-CM

## 2016-05-04 DIAGNOSIS — Z1211 Encounter for screening for malignant neoplasm of colon: Secondary | ICD-10-CM | POA: Diagnosis not present

## 2016-05-04 DIAGNOSIS — F0280 Dementia in other diseases classified elsewhere without behavioral disturbance: Secondary | ICD-10-CM

## 2016-05-04 DIAGNOSIS — E785 Hyperlipidemia, unspecified: Secondary | ICD-10-CM

## 2016-05-04 DIAGNOSIS — G10 Huntington's disease: Secondary | ICD-10-CM | POA: Diagnosis not present

## 2016-05-04 DIAGNOSIS — F329 Major depressive disorder, single episode, unspecified: Secondary | ICD-10-CM

## 2016-05-04 DIAGNOSIS — R1319 Other dysphagia: Secondary | ICD-10-CM

## 2016-05-04 DIAGNOSIS — F0393 Unspecified dementia, unspecified severity, with mood disturbance: Secondary | ICD-10-CM

## 2016-05-04 LAB — POC HEMOCCULT BLD/STL (OFFICE/1-CARD/DIAGNOSTIC): Fecal Occult Blood, POC: NEGATIVE

## 2016-05-04 NOTE — Patient Instructions (Signed)
F/u in 4 month, call if you need me before  Rectal and flu vaccine today  STOP iron, lovastatin, fish oil and gabapentin  Fasting , lipid, cmp and EGFR, hBA1C , TSH and vit D in 4 month  All the best for 2018! Thank you  for choosing [facility]. We consider it a privelige to serve you.  Delivering excellent health care in a caring and  compassionate way is our goal.  Partnering with you,  so that together we can achieve this goal is our strategy.

## 2016-05-08 NOTE — Assessment & Plan Note (Signed)
Controlled, no change in medication  

## 2016-05-08 NOTE — Assessment & Plan Note (Signed)
Progressive deterioration in overall health, with poor mobility, reduced communication and increased social withdrawal. 5 mins spent with Mother discussing quality of life goals and her expectations as far as need for cancer screening etc. She is currently conflicted and chooses to continue  screening but wants to reduce medications if possible due to extreme difficulty swallowing

## 2016-05-08 NOTE — Assessment & Plan Note (Signed)
Hyperlipidemia:Low fat diet discussed and encouraged.   Lipid Panel  Lab Results  Component Value Date   CHOL 164 08/10/2015   HDL 37 (L) 08/10/2015   LDLCALC 109 08/10/2015   TRIG 88 08/10/2015   CHOLHDL 4.4 08/10/2015   D/c the statin and review in 4 months

## 2016-05-08 NOTE — Assessment & Plan Note (Signed)
Continue current medication.
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
## 2016-05-08 NOTE — Assessment & Plan Note (Signed)
Progressively worsening, however reportedly able to eatt pureed foods

## 2016-05-24 ENCOUNTER — Other Ambulatory Visit: Payer: Self-pay | Admitting: Family Medicine

## 2016-05-27 ENCOUNTER — Other Ambulatory Visit: Payer: Self-pay | Admitting: Family Medicine

## 2016-06-06 ENCOUNTER — Telehealth: Payer: Self-pay

## 2016-06-06 DIAGNOSIS — G10 Huntington's disease: Secondary | ICD-10-CM

## 2016-06-06 NOTE — Telephone Encounter (Signed)
Will need to initially ask the pharmacist , if an issue, will need to ert neurology to advise, I know she has not seeen neurology for a long time, but a consultation for med management may be needed

## 2016-06-16 ENCOUNTER — Other Ambulatory Visit: Payer: Self-pay | Admitting: Family Medicine

## 2016-06-16 NOTE — Telephone Encounter (Signed)
Namenda falls into the same class of medication.  Do you want to change to this and still have patient to be evaluated by neurology after start of new year?

## 2016-06-16 NOTE — Addendum Note (Signed)
Addended by: Denman George B on: 06/16/2016 05:03 PM   Modules accepted: Orders

## 2016-06-16 NOTE — Telephone Encounter (Signed)
Spoke with mother at [facility].  She is aware that patient is on 2 medications in the same class.  She is also aware that it is best to let the specialist determine the need of the medications.  Referral entered.   Mother will proceed with signing up for insurance benefits for 2018 and will request formulary from insurance.

## 2016-06-16 NOTE — Telephone Encounter (Signed)
Pt has been on namenda since October, needs to see neurologist for medication management, please refer her to neurologist of choice, may decide on local mD, discuss with Mother , I will sign

## 2016-06-22 ENCOUNTER — Encounter: Payer: Self-pay | Admitting: *Deleted

## 2016-06-22 ENCOUNTER — Ambulatory Visit (INDEPENDENT_AMBULATORY_CARE_PROVIDER_SITE_OTHER): Payer: Medicare Other | Admitting: *Deleted

## 2016-06-22 DIAGNOSIS — Z3042 Encounter for surveillance of injectable contraceptive: Secondary | ICD-10-CM | POA: Diagnosis not present

## 2016-06-22 MED ORDER — MEDROXYPROGESTERONE ACETATE 150 MG/ML IM SUSP
150.0000 mg | Freq: Once | INTRAMUSCULAR | Status: AC
  Administered 2016-06-22: 150 mg via INTRAMUSCULAR

## 2016-06-22 NOTE — Progress Notes (Signed)
Pt here for Depo. Pt is not sexually active so no pregnancy test done. Pt is on Depo for period management. Pt tolerated shot well. Return in 12 weeks for next shot. [place]

## 2016-07-20 DIAGNOSIS — R1310 Dysphagia, unspecified: Secondary | ICD-10-CM | POA: Diagnosis not present

## 2016-07-20 DIAGNOSIS — R471 Dysarthria and anarthria: Secondary | ICD-10-CM | POA: Diagnosis not present

## 2016-07-20 DIAGNOSIS — R269 Unspecified abnormalities of gait and mobility: Secondary | ICD-10-CM | POA: Diagnosis not present

## 2016-07-20 DIAGNOSIS — R251 Tremor, unspecified: Secondary | ICD-10-CM | POA: Diagnosis not present

## 2016-07-20 DIAGNOSIS — G10 Huntington's disease: Secondary | ICD-10-CM | POA: Diagnosis not present

## 2016-08-08 ENCOUNTER — Other Ambulatory Visit: Payer: Self-pay | Admitting: Family Medicine

## 2016-08-10 ENCOUNTER — Other Ambulatory Visit: Payer: Self-pay

## 2016-08-10 MED ORDER — UNABLE TO FIND: 11 refills | Status: DC

## 2016-08-10 MED ORDER — UNDERPADS MISC: 11 refills | Status: AC

## 2016-08-31 ENCOUNTER — Ambulatory Visit (INDEPENDENT_AMBULATORY_CARE_PROVIDER_SITE_OTHER): Payer: Medicare Other | Admitting: Family Medicine

## 2016-08-31 ENCOUNTER — Encounter: Payer: Self-pay | Admitting: Family Medicine

## 2016-08-31 VITALS — BP 118/78 | HR 98 | Resp 16 | Ht 64.0 in | Wt 140.0 lb

## 2016-08-31 DIAGNOSIS — R7301 Impaired fasting glucose: Secondary | ICD-10-CM | POA: Diagnosis not present

## 2016-08-31 DIAGNOSIS — F329 Major depressive disorder, single episode, unspecified: Secondary | ICD-10-CM

## 2016-08-31 DIAGNOSIS — G10 Huntington's disease: Secondary | ICD-10-CM

## 2016-08-31 DIAGNOSIS — E785 Hyperlipidemia, unspecified: Secondary | ICD-10-CM

## 2016-08-31 DIAGNOSIS — F0393 Unspecified dementia, unspecified severity, with mood disturbance: Secondary | ICD-10-CM

## 2016-08-31 DIAGNOSIS — E559 Vitamin D deficiency, unspecified: Secondary | ICD-10-CM | POA: Diagnosis not present

## 2016-08-31 DIAGNOSIS — N920 Excessive and frequent menstruation with regular cycle: Secondary | ICD-10-CM | POA: Diagnosis not present

## 2016-08-31 DIAGNOSIS — L210 Seborrhea capitis: Secondary | ICD-10-CM

## 2016-08-31 DIAGNOSIS — F0280 Dementia in other diseases classified elsewhere without behavioral disturbance: Secondary | ICD-10-CM

## 2016-08-31 NOTE — Patient Instructions (Addendum)
Wellness visit with Albina Billet July  call if you need me before   Lipid,. cmp and EGFR, HBA1C, tsh and vit D today  Careful not to fall  No med changes  Thank you  for choosing  Primary Care. We consider it a privelige to serve you.  Delivering excellent health care in a caring and  compassionate way is our goal.  Partnering with you,  so that together we can achieve this goal is our strategy.

## 2016-09-01 LAB — COMPLETE METABOLIC PANEL WITH GFR
ALBUMIN: 4.2 g/dL (ref 3.6–5.1)
ALT: 9 U/L (ref 6–29)
AST: 11 U/L (ref 10–35)
Alkaline Phosphatase: 61 U/L (ref 33–115)
BUN: 12 mg/dL (ref 7–25)
CHLORIDE: 107 mmol/L (ref 98–110)
CO2: 30 mmol/L (ref 20–31)
Calcium: 9.6 mg/dL (ref 8.6–10.2)
Creat: 0.95 mg/dL (ref 0.50–1.10)
GFR, EST AFRICAN AMERICAN: 84 mL/min (ref >=60)
GFR, EST NON AFRICAN AMERICAN: 73 mL/min (ref >=60)
Glucose, Bld: 81 mg/dL (ref 65–99)
POTASSIUM: 3.9 mmol/L (ref 3.5–5.3)
Sodium: 143 mmol/L (ref 135–146)
Total Bilirubin: 0.5 mg/dL (ref 0.2–1.2)
Total Protein: 7 g/dL (ref 6.1–8.1)

## 2016-09-01 LAB — LIPID PANEL
CHOL/HDL RATIO: 4.9 ratio (ref <5.0)
Cholesterol: 167 mg/dL (ref <200)
HDL: 34 mg/dL — AB (ref >50)
LDL Cholesterol: 114 mg/dL — ABNORMAL HIGH (ref <100)
TRIGLYCERIDES: 96 mg/dL (ref <150)
VLDL: 19 mg/dL (ref <30)

## 2016-09-01 LAB — HEMOGLOBIN A1C
HEMOGLOBIN A1C: 5.5 % (ref <5.7)
MEAN PLASMA GLUCOSE: 111 mg/dL

## 2016-09-01 LAB — VITAMIN D 25 HYDROXY (VIT D DEFICIENCY, FRACTURES): Vit D, 25-Hydroxy: 36 ng/mL (ref 30–100)

## 2016-09-01 LAB — TSH: TSH: 1.49 m[IU]/L

## 2016-09-02 NOTE — Assessment & Plan Note (Signed)
Hyperlipidemia:Low fat diet discussed and encouraged.   Lipid Panel  Lab Results  Component Value Date   CHOL 167 08/31/2016   HDL 34 (L) 08/31/2016   LDLCALC 114 (H) 08/31/2016   TRIG 96 08/31/2016   CHOLHDL 4.9 08/31/2016     Needs to reduce fat intake no med change

## 2016-09-02 NOTE — Progress Notes (Signed)
   Victoria [name]     MRN: [id number]      DOB: Apr 21, 1971   HPI Victoria [name] is here for follow up and re-evaluation of chronic medical conditions, medication management and review of any available recent lab and radiology data.  Preventive health is updated, specifically  Cancer screening and Immunization.   Questions or concerns regarding consultations or procedures which the PT has had in the interim are  addressed.Aunt is here with her and is happy with local neuro consult for medication management, no follow up planned unless needed The PT denies any adverse reactions to current medications since the last visit.  There are no new concerns.  There are no specific complaints  She has has several falls which occur when unsupervised and have resulted in no significant trauma No choking witnessed, has to have supervised eating  ROS History is from Aunt as pt is incapable due to her neurologic illness Denies recent fever or chills. Denies sinus pressure, nasal congestion, ear pain or sore throat. Denies chest congestion, productive cough or wheezing. Denies chest pains, palpitations and leg swelling Denies abdominal pain, nausea, vomiting,diarrhea or constipation.   Denies dysuria, frequency, hesitancy or incontinence.  Denies headaches, seizures, numbness, or tingling. Denies depression, anxiety or insomnia. Denies skin break down or rash.   PE  BP 118/78   Pulse 98   Resp 16   Ht 5\' 4"  (1.626 m)   Wt 140 lb (63.5 kg)   SpO2 99%   BMI 24.03 kg/m   Patient alert  and in no cardiopulmonary distress.  HEENT: No facial asymmetry, EOMI,   oropharynx pink and moist.  Neck decreased ROM no JVD, no mass.  Chest: Clear to auscultation bilaterally.  CVS: S1, S2 no murmurs, no S3.Regular rate.  ABD: Soft non tender.   Ext: No edema  MS: decreased  ROM spine, shoulders, hips and knees.  Skin: Intact, no ulcerations or rash noted.  Psych: Flat affect.mildly anxious not   depressed appearing.  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   Lipid Panel  Lab Results  Component Value Date   CHOL 167 08/31/2016   HDL 34 (L) 08/31/2016   LDLCALC 114 (H) 08/31/2016   TRIG 96 08/31/2016   CHOLHDL 4.9 08/31/2016     Needs to reduce fat intake no med change  Heavy menses Controlled wit depo

## 2016-09-02 NOTE — Assessment & Plan Note (Signed)
main tin current medication , unfortunately progressive illness, appears unchanged since last visit

## 2016-09-02 NOTE — Assessment & Plan Note (Signed)
Controlled wit depo

## 2016-09-02 NOTE — Assessment & Plan Note (Signed)
Home safety reviewed with caregiver

## 2016-09-02 NOTE — Assessment & Plan Note (Signed)
Controlled, no change in medication  

## 2016-09-07 DIAGNOSIS — R471 Dysarthria and anarthria: Secondary | ICD-10-CM | POA: Diagnosis not present

## 2016-09-07 DIAGNOSIS — G10 Huntington's disease: Secondary | ICD-10-CM | POA: Diagnosis not present

## 2016-09-07 DIAGNOSIS — R269 Unspecified abnormalities of gait and mobility: Secondary | ICD-10-CM | POA: Diagnosis not present

## 2016-09-07 DIAGNOSIS — R251 Tremor, unspecified: Secondary | ICD-10-CM | POA: Diagnosis not present

## 2016-09-07 DIAGNOSIS — R1310 Dysphagia, unspecified: Secondary | ICD-10-CM | POA: Diagnosis not present

## 2016-09-07 DIAGNOSIS — E119 Type 2 diabetes mellitus without complications: Secondary | ICD-10-CM | POA: Diagnosis not present

## 2016-09-14 ENCOUNTER — Ambulatory Visit: Payer: Medicare Other

## 2016-09-15 ENCOUNTER — Other Ambulatory Visit: Payer: Self-pay | Admitting: Family Medicine

## 2016-09-20 DIAGNOSIS — R270 Ataxia, unspecified: Secondary | ICD-10-CM | POA: Diagnosis not present

## 2016-09-20 DIAGNOSIS — E538 Deficiency of other specified B group vitamins: Secondary | ICD-10-CM | POA: Diagnosis not present

## 2016-09-20 DIAGNOSIS — Z79899 Other long term (current) drug therapy: Secondary | ICD-10-CM | POA: Diagnosis not present

## 2016-09-20 DIAGNOSIS — R5383 Other fatigue: Secondary | ICD-10-CM | POA: Diagnosis not present

## 2016-09-20 DIAGNOSIS — E559 Vitamin D deficiency, unspecified: Secondary | ICD-10-CM | POA: Diagnosis not present

## 2016-09-20 DIAGNOSIS — G10 Huntington's disease: Secondary | ICD-10-CM | POA: Diagnosis not present

## 2016-09-20 DIAGNOSIS — M818 Other osteoporosis without current pathological fracture: Secondary | ICD-10-CM | POA: Diagnosis not present

## 2016-10-25 ENCOUNTER — Ambulatory Visit (INDEPENDENT_AMBULATORY_CARE_PROVIDER_SITE_OTHER): Payer: Medicare Other | Admitting: *Deleted

## 2016-10-25 ENCOUNTER — Encounter: Payer: Self-pay | Admitting: *Deleted

## 2016-10-25 DIAGNOSIS — Z3042 Encounter for surveillance of injectable contraceptive: Secondary | ICD-10-CM

## 2016-10-25 MED ORDER — MEDROXYPROGESTERONE ACETATE 150 MG/ML IM SUSP
150.0000 mg | Freq: Once | INTRAMUSCULAR | Status: AC
  Administered 2016-10-25: 150 mg via INTRAMUSCULAR

## 2016-10-25 NOTE — Progress Notes (Signed)
Pt here for Deop Provera injection, pt is not sexually active and lives in a nursing home, she is on Depo for period management.  Pt could not urinate today.  Injection was given in Rt deltoid, pt tolerated well.  Informed to return in 12 weeks for next injection.

## 2016-11-11 ENCOUNTER — Other Ambulatory Visit: Payer: Self-pay | Admitting: Obstetrics and Gynecology

## 2016-11-16 ENCOUNTER — Other Ambulatory Visit: Payer: Self-pay | Admitting: *Deleted

## 2016-11-16 DIAGNOSIS — Z3042 Encounter for surveillance of injectable contraceptive: Secondary | ICD-10-CM

## 2016-11-16 MED ORDER — MEDROXYPROGESTERONE ACETATE 150 MG/ML IM SUSP: 150.0000 mg | INTRAMUSCULAR | 3 refills | Status: DC

## 2016-11-30 DIAGNOSIS — G10 Huntington's disease: Secondary | ICD-10-CM | POA: Diagnosis not present

## 2016-11-30 DIAGNOSIS — R269 Unspecified abnormalities of gait and mobility: Secondary | ICD-10-CM | POA: Diagnosis not present

## 2016-11-30 DIAGNOSIS — E119 Type 2 diabetes mellitus without complications: Secondary | ICD-10-CM | POA: Diagnosis not present

## 2016-11-30 DIAGNOSIS — R251 Tremor, unspecified: Secondary | ICD-10-CM | POA: Diagnosis not present

## 2016-11-30 DIAGNOSIS — R471 Dysarthria and anarthria: Secondary | ICD-10-CM | POA: Diagnosis not present

## 2017-01-16 ENCOUNTER — Ambulatory Visit (INDEPENDENT_AMBULATORY_CARE_PROVIDER_SITE_OTHER): Payer: Medicare Other

## 2017-01-16 ENCOUNTER — Encounter: Payer: Self-pay | Admitting: Family Medicine

## 2017-01-16 ENCOUNTER — Ambulatory Visit (INDEPENDENT_AMBULATORY_CARE_PROVIDER_SITE_OTHER): Payer: Medicare Other | Admitting: Family Medicine

## 2017-01-16 VITALS — BP 118/86 | HR 72 | Resp 16 | Ht 64.0 in | Wt 130.1 lb

## 2017-01-16 VITALS — BP 118/86 | HR 72 | Temp 98.4°F | Ht 64.0 in | Wt 143.1 lb

## 2017-01-16 DIAGNOSIS — R634 Abnormal weight loss: Secondary | ICD-10-CM | POA: Diagnosis not present

## 2017-01-16 DIAGNOSIS — Z0000 Encounter for general adult medical examination without abnormal findings: Secondary | ICD-10-CM

## 2017-01-16 DIAGNOSIS — G319 Degenerative disease of nervous system, unspecified: Secondary | ICD-10-CM

## 2017-01-16 DIAGNOSIS — R238 Other skin changes: Secondary | ICD-10-CM

## 2017-01-16 DIAGNOSIS — L909 Atrophic disorder of skin, unspecified: Secondary | ICD-10-CM | POA: Diagnosis not present

## 2017-01-16 MED ORDER — CEPHALEXIN 250 MG PO CAPS: 250.0000 mg | ORAL_CAPSULE | Freq: Two times a day (BID) | ORAL | 0 refills | Status: DC

## 2017-01-16 NOTE — Assessment & Plan Note (Signed)
Progressive deterioration noted by family, poor oral intake, less communication and ability to function, but they are very supportive

## 2017-01-16 NOTE — Patient Instructions (Addendum)
Victoria [name] , Thank you for taking time to come for your Medicare Wellness Visit. I appreciate your ongoing commitment to your health goals. Please review the following plan we discussed and let me know if I can assist you in the future.   Screening recommendations/referrals: Colonoscopy: Due at age 46 Mammogram: Due at age 37 Bone Density: Due at age 44 Recommended yearly ophthalmology/optometry visit for glaucoma screening and checkup Recommended yearly dental visit for hygiene and checkup  Vaccinations: Influenza vaccine: Up to date, next due 03/2017 Pneumococcal vaccine: Due at age 69 Tdap vaccine: Up to date, next due 08/2021 Shingles vaccine: Due at age 67    Advanced directives: Advance directive discussed with you today. I have provided a copy for you to complete at home and have notarized. Once this is complete please bring a copy in to our office so we can scan it into your chart.  Next appointment: Follow up with Dr. Moshe Cipro today. Follow up in 1 year for your annual wellness visit.   Preventive Care 40-64 Years, Female Preventive care refers to lifestyle choices and visits with your health care provider that can promote health and wellness. What does preventive care include?  A yearly physical exam. This is also called an annual well check.  Dental exams once or twice a year.  Routine eye exams. Ask your health care provider how often you should have your eyes checked.  Personal lifestyle choices, including:  Daily care of your teeth and gums.  Regular physical activity.  Eating a healthy diet.  Avoiding tobacco and drug use.  Limiting alcohol use.  Practicing safe sex.  Taking low-dose aspirin daily starting at age 85.  Taking vitamin and mineral supplements as recommended by your health care provider. What happens during an annual well check? The services and screenings done by your health care provider during your annual well check will depend on your age,  overall health, lifestyle risk factors, and family history of disease. Counseling  Your health care provider may ask you questions about your:  Alcohol use.  Tobacco use.  Drug use.  Emotional well-being.  Home and relationship well-being.  Sexual activity.  Eating habits.  Work and work Statistician.  Method of birth control.  Menstrual cycle.  Pregnancy history. Screening  You may have the following tests or measurements:  Height, weight, and BMI.  Blood pressure.  Lipid and cholesterol levels. These may be checked every 5 years, or more frequently if you are over 48 years old.  Skin check.  Lung cancer screening. You may have this screening every year starting at age 30 if you have a 30-pack-year history of smoking and currently smoke or have quit within the past 15 years.  Fecal occult blood test (FOBT) of the stool. You may have this test every year starting at age 69.  Flexible sigmoidoscopy or colonoscopy. You may have a sigmoidoscopy every 5 years or a colonoscopy every 10 years starting at age 78.  Hepatitis C blood test.  Hepatitis B blood test.  Sexually transmitted disease (STD) testing.  Diabetes screening. This is done by checking your blood sugar (glucose) after you have not eaten for a while (fasting). You may have this done every 1-3 years.  Mammogram. This may be done every 1-2 years. Talk to your health care provider about when you should start having regular mammograms. This may depend on whether you have a family history of breast cancer.  BRCA-related cancer screening. This may be done if  you have a family history of breast, ovarian, tubal, or peritoneal cancers.  Pelvic exam and Pap test. This may be done every 3 years starting at age 83. Starting at age 65, this may be done every 5 years if you have a Pap test in combination with an HPV test.  Bone density scan. This is done to screen for osteoporosis. You may have this scan if you are at  high risk for osteoporosis. Discuss your test results, treatment options, and if necessary, the need for more tests with your health care provider. Vaccines  Your health care provider may recommend certain vaccines, such as:  Influenza vaccine. This is recommended every year.  Tetanus, diphtheria, and acellular pertussis (Tdap, Td) vaccine. You may need a Td booster every 10 years.  Zoster vaccine. You may need this after age 18.  Pneumococcal 13-valent conjugate (PCV13) vaccine. You may need this if you have certain conditions and were not previously vaccinated.  Pneumococcal polysaccharide (PPSV23) vaccine. You may need one or two doses if you smoke cigarettes or if you have certain conditions. Talk to your health care provider about which screenings and vaccines you need and how often you need them. This information is not intended to replace advice given to you by your health care provider. Make sure you discuss any questions you have with your health care provider. Document Released: 07/03/2015 Document Revised: 02/24/2016 Document Reviewed: 04/07/2015 Elsevier Interactive Patient Education  2017 [place] Prevention in the Home Falls can cause injuries. They can happen to people of all ages. There are many things you can do to make your home safe and to help prevent falls. What can I do on the outside of my home?  Regularly fix the edges of walkways and driveways and fix any cracks.  Remove anything that might make you trip as you walk through a door, such as a raised step or threshold.  Trim any bushes or trees on the path to your home.  Use bright outdoor lighting.  Clear any walking paths of anything that might make someone trip, such as rocks or tools.  Regularly check to see if handrails are loose or broken. Make sure that both sides of any steps have handrails.  Any raised decks and porches should have guardrails on the edges.  Have any leaves, snow, or  ice cleared regularly.  Use sand or salt on walking paths during winter.  Clean up any spills in your garage right away. This includes oil or grease spills. What can I do in the bathroom?  Use night lights.  Install grab bars by the toilet and in the tub and shower. Do not use towel bars as grab bars.  Use non-skid mats or decals in the tub or shower.  If you need to sit down in the shower, use a plastic, non-slip stool.  Keep the floor dry. Clean up any water that spills on the floor as soon as it happens.  Remove soap buildup in the tub or shower regularly.  Attach bath mats securely with double-sided non-slip rug tape.  Do not have throw rugs and other things on the floor that can make you trip. What can I do in the bedroom?  Use night lights.  Make sure that you have a light by your bed that is easy to reach.  Do not use any sheets or blankets that are too big for your bed. They should not hang down onto the floor.  Have a firm chair that has side arms. You can use this for support while you get dressed.  Do not have throw rugs and other things on the floor that can make you trip. What can I do in the kitchen?  Clean up any spills right away.  Avoid walking on wet floors.  Keep items that you use a lot in easy-to-reach places.  If you need to reach something above you, use a strong step stool that has a grab bar.  Keep electrical cords out of the way.  Do not use floor polish or wax that makes floors slippery. If you must use wax, use non-skid floor wax.  Do not have throw rugs and other things on the floor that can make you trip. What can I do with my stairs?  Do not leave any items on the stairs.  Make sure that there are handrails on both sides of the stairs and use them. Fix handrails that are broken or loose. Make sure that handrails are as long as the stairways.  Check any carpeting to make sure that it is firmly attached to the stairs. Fix any carpet  that is loose or worn.  Avoid having throw rugs at the top or bottom of the stairs. If you do have throw rugs, attach them to the floor with carpet tape.  Make sure that you have a light switch at the top of the stairs and the bottom of the stairs. If you do not have them, ask someone to add them for you. What else can I do to help prevent falls?  Wear shoes that:  Do not have high heels.  Have rubber bottoms.  Are comfortable and fit you well.  Are closed at the toe. Do not wear sandals.  If you use a stepladder:  Make sure that it is fully opened. Do not climb a closed stepladder.  Make sure that both sides of the stepladder are locked into place.  Ask someone to hold it for you, if possible.  Clearly mark and make sure that you can see:  Any grab bars or handrails.  First and last steps.  Where the edge of each step is.  Use tools that help you move around (mobility aids) if they are needed. These include:  Canes.  Walkers.  Scooters.  Crutches.  Turn on the lights when you go into a dark area. Replace any light bulbs as soon as they burn out.  Set up your furniture so you have a clear path. Avoid moving your furniture around.  If any of your floors are uneven, fix them.  If there are any pets around you, be aware of where they are.  Review your medicines with your doctor. Some medicines can make you feel dizzy. This can increase your chance of falling. Ask your doctor what other things that you can do to help prevent falls. This information is not intended to replace advice given to you by your health care provider. Make sure you discuss any questions you have with your health care provider. Document Released: 04/02/2009 Document Revised: 11/12/2015 Document Reviewed: 07/11/2014 Elsevier Interactive Patient Education  2017 [employer].

## 2017-01-16 NOTE — Patient Instructions (Signed)
F/u in 3.5 month, call if you need me before  Need to keep hands away from skin so it will heal and keep it clean  Five days of keflex sent in  Careful not to fall Thank you  for choosing [facility]. We consider it a privelige to serve you.  Delivering excellent health care in a caring and  compassionate way is our goal.  Partnering with you,  so that together we can achieve this goal is our strategy.

## 2017-01-16 NOTE — Assessment & Plan Note (Signed)
Rash on back of neck from scratching, protect with dry gauze and gloves. Tetanus is uTD 5 days of keflex as prophylaxis

## 2017-01-16 NOTE — Assessment & Plan Note (Signed)
Significant weight loss due to poor oral intake as patient's overall health deteriorated. Encouraged family to thicken feeds due to high risk of aspiration and feed often , small amounts

## 2017-01-16 NOTE — Progress Notes (Signed)
Subjective:   Victoria [name] is a 46 y.o. female who presents for Medicare Annual (Subsequent) preventive examination.  Review of Systems: Patient's family concerned with recent weight loss, difficulty swallowing, and rash to the back of her neck. Appointment scheduled with Dr. Moshe Cipro today Cardiac Risk Factors include: dyslipidemia;sedentary lifestyle     Objective:     Vitals: BP 118/86   Pulse 72   Temp 98.4 F (36.9 C) (Temporal)   Ht 5\' 4"  (1.626 m)   Wt 143 lb 1.9 oz (64.9 kg)   BMI 24.57 kg/m   Body mass index is 24.57 kg/m.   Tobacco History  Smoking Status  . Never Smoker  Smokeless Tobacco  . Never Used     Counseling given: Not Answered   Past Medical History:  Diagnosis Date  . Anxiety   . Diabetes mellitus   . Dyslipidemia   . Fibroids 06/10/2014  . Huntington's disease ([place])   . Hypertension   . Menorrhagia with regular cycle 06/02/2014  . Mild cognitive impairment   . Obesity   . Perennial allergic rhinitis   . Sleep apnea    cannot tolerate CPAP.   History reviewed. No pertinent surgical history. Family History  Problem Relation Age of Onset  . Huntington's disease Father   . Diabetes Mother   . Hypertension Mother   . Hyperlipidemia Mother   . Huntington's disease Brother   . Alzheimer's disease Maternal Grandmother   . Cancer Unknown        family history   . Diabetes Unknown        family history   . Heart defect Unknown        family history    History  Sexual Activity  . Sexual activity: No    Outpatient Encounter Prescriptions as of 01/16/2017  Medication Sig  . citalopram (CELEXA) 20 MG tablet TAKE ONE (1) TABLET BY MOUTH EVERY DAY  . galantamine (RAZADYNE ER) 24 MG 24 hr capsule TAKE ONE (1) CAPSULE EACH DAY  . Incontinence Supply Disposable (UNDERPADS) MISC 30 per month  . lovastatin (MEVACOR) 20 MG tablet TAKE ONE TABLET DAILY AT BEDTIME  . medroxyPROGESTERone (DEPO-PROVERA) 150 MG/ML injection Inject 1 mL (150 mg  total) into the muscle every 3 (three) months.  . megestrol (MEGACE) 40 MG tablet TAKE ONE TABLET BY MOUTH DAILY ON DAYS OF SPOTTING. IF SPOTTING CONTINUES ON ONCE DAILY MAY INCREASE TO TWICE A DAY  . memantine (NAMENDA) 10 MG tablet TAKE ONE TABLET TWICE DAILY  . traZODone (DESYREL) 50 MG tablet TAKE ONE TABLET DAILY AT BEDTIME  . UNABLE TO FIND Pullups  100/m  . Clobetasol Propionate (CLOBEX) 0.05 % shampoo Apply topically twice daily (Patient not taking: Reported on 01/16/2017)   No facility-administered encounter medications on file as of 01/16/2017.     Activities of Daily Living In your present state of health, do you have any difficulty performing the following activities: 01/16/2017  Hearing? N  Vision? N  Difficulty concentrating or making decisions? Y  Walking or climbing stairs? Y  Dressing or bathing? Y  Doing errands, shopping? Y  Preparing Food and eating ? Y  Using the Toilet? Y  In the past six months, have you accidently leaked urine? Y  Do you have problems with loss of bowel control? Y  Managing your Medications? Y  Managing your Finances? Y  Housekeeping or managing your Housekeeping? Y  Some recent data might be hidden    Patient Care Team:  Fayrene Helper, MD as PCP - General [name], MD as Consulting Physician (Obstetrics and Gynecology) Danie Binder, MD as Consulting Physician (Gastroenterology) Phillips Odor, MD as Consulting Physician (Neurology)    Assessment:    Exercise Activities and Dietary recommendations Current Exercise Habits: The patient does not participate in regular exercise at present  Goals    None     Fall Risk Fall Risk  01/16/2017 12/10/2015 07/16/2013  Falls in the past year? Yes No No  Number falls in past yr: 2 or more - -  Injury with Fall? No - -  Risk Factor Category  High Fall Risk - -  Risk for fall due to : History of fall(s);Impaired balance/gait;Impaired mobility;Mental status change - -  Follow up  Falls evaluation completed;Education provided;Falls prevention discussed - -   Depression Screen PHQ 2/9 Scores 01/16/2017 07/16/2013  PHQ - 2 Score - 0  PHQ- 9 Score - 4  Exception Documentation Medical reason -     Cognitive Function MMSE - Mini Mental State Exam 01/16/2017  Not completed: Unable to complete        Immunization History  Administered Date(s) Administered  . Influenza Split 04/27/2011, 05/01/2012  . Influenza Whole 03/22/2007, 04/21/2009, 03/04/2010  . Influenza,inj,Quad PF,36+ Mos 03/19/2013, 05/08/2014, 04/14/2015, 05/04/2016  . Pneumococcal Conjugate-13 08/26/2014  . Pneumococcal Polysaccharide-23 04/21/2009  . Td 01/17/2007  . Tdap 08/25/2011   Screening Tests Health Maintenance  Topic Date Due  . FOOT EXAM  06/01/2017 (Originally 03/21/2016)  . PAP SMEAR  06/01/2017 (Originally 04/26/2014)  . OPHTHALMOLOGY EXAM  01/03/2018 (Originally 12/31/2014)  . URINE MICROALBUMIN  10/05/2018 (Originally 05/07/2015)  . INFLUENZA VACCINE  01/18/2017  . HEMOGLOBIN A1C  03/03/2017  . TETANUS/TDAP  08/24/2021  . PNEUMOCOCCAL POLYSACCHARIDE VACCINE  Completed  . HIV Screening  Completed      Plan:   I have personally reviewed and noted the following in the patient's chart:   . Medical and social history . Use of alcohol, tobacco or illicit drugs  . Current medications and supplements . Functional ability and status . Nutritional status . Physical activity . Advanced directives . List of other physicians . Hospitalizations, surgeries, and ER visits in previous 12 months . Vitals . Screenings to include cognitive, depression, and falls . Referrals and appointments  In addition, I have reviewed and discussed with patient certain preventive protocols, quality metrics, and best practice recommendations. A written personalized care plan for preventive services as well as general preventive health recommendations were provided to patient.     Stormy Fabian,  LPN  06/25/[id number]

## 2017-01-16 NOTE — Progress Notes (Signed)
   RACQUEL ARKIN     MRN: [id number]      DOB: [birth year]   HPI Ms. Better is here with a 1 week of skin breakdown on back of neck where she has started excessive scratching. Skin is open , stage 1 ulceration, no purulent drainage noted , but report is that some drainage has been noted, no receeding rash Also c/o poor intake and weight loss ROS See HPI History from Mom and Aunt as pt is incapable C/o limitation in mobility, increased fall risk due to unsteady gait  PE  BP 118/86   Pulse 72   Resp 16   Ht 5\' 4"  (1.626 m)   Wt 130 lb 1.3 oz (59 kg)   SpO2 97%   BMI 22.33 kg/m   Patient alert and in no cardiopulmonary distress.  HEENT: No facial asymmetry, EOMI,   .  Neck decreased ROM no mass.  Chest: Clear to auscultation bilaterally.  CVS: S1, S2 no murmurs, no S3.Regular rate.  ABD: Soft non tender.   Ext: No edema  MS: decreased  ROM spine, shoulders, hips and knees.  Skin: Ulceration and skin breakdown noted on posterior neck  CNS: CN 2-12 intact, power,  normal throughout.no focal deficits noted.   Assessment & Plan  Skin breakdown Rash on back of neck from scratching, protect with dry gauze and gloves. Tetanus is uTD 5 days of keflex as prophylaxis  Weight loss, abnormal Significant weight loss due to poor oral intake as patient's overall health deteriorated. Encouraged family to thicken feeds due to high risk of aspiration and feed often , small amounts  Neurodegenerative cognitive impairment Progressive deterioration noted by family, poor oral intake, less communication and ability to function, but they are very supportive

## 2017-01-17 ENCOUNTER — Ambulatory Visit (INDEPENDENT_AMBULATORY_CARE_PROVIDER_SITE_OTHER): Payer: Medicare Other

## 2017-01-17 VITALS — Wt 143.0 lb

## 2017-01-17 DIAGNOSIS — Z3042 Encounter for surveillance of injectable contraceptive: Secondary | ICD-10-CM | POA: Diagnosis not present

## 2017-01-17 DIAGNOSIS — Z3202 Encounter for pregnancy test, result negative: Secondary | ICD-10-CM

## 2017-01-17 LAB — POCT URINE PREGNANCY: Preg Test, Ur: NEGATIVE

## 2017-01-17 MED ORDER — MEDROXYPROGESTERONE ACETATE 150 MG/ML IM SUSP
150.0000 mg | Freq: Once | INTRAMUSCULAR | Status: AC
  Administered 2017-01-17: 150 mg via INTRAMUSCULAR

## 2017-01-17 NOTE — Progress Notes (Signed)
PT here for depo shot 150 mg IM, given RT Deltoid. Tolerated well. Return 12 week for next shot. Pad CMA

## 2017-01-19 ENCOUNTER — Other Ambulatory Visit: Payer: Self-pay

## 2017-01-19 MED ORDER — CITALOPRAM HYDROBROMIDE 20 MG PO TABS: ORAL_TABLET | ORAL | 2 refills | Status: DC

## 2017-02-17 ENCOUNTER — Other Ambulatory Visit: Payer: Self-pay | Admitting: Family Medicine

## 2017-02-17 NOTE — Telephone Encounter (Signed)
Seen [phone]

## 2017-03-18 ENCOUNTER — Other Ambulatory Visit: Payer: Self-pay | Admitting: Family Medicine

## 2017-03-29 DIAGNOSIS — Z79899 Other long term (current) drug therapy: Secondary | ICD-10-CM | POA: Diagnosis not present

## 2017-03-29 DIAGNOSIS — R471 Dysarthria and anarthria: Secondary | ICD-10-CM | POA: Diagnosis not present

## 2017-03-29 DIAGNOSIS — R269 Unspecified abnormalities of gait and mobility: Secondary | ICD-10-CM | POA: Diagnosis not present

## 2017-03-29 DIAGNOSIS — G10 Huntington's disease: Secondary | ICD-10-CM | POA: Diagnosis not present

## 2017-04-11 ENCOUNTER — Ambulatory Visit: Payer: Medicare Other

## 2017-04-12 ENCOUNTER — Encounter: Payer: Self-pay | Admitting: *Deleted

## 2017-04-12 ENCOUNTER — Ambulatory Visit (INDEPENDENT_AMBULATORY_CARE_PROVIDER_SITE_OTHER): Payer: Medicare Other | Admitting: *Deleted

## 2017-04-12 DIAGNOSIS — Z3042 Encounter for surveillance of injectable contraceptive: Secondary | ICD-10-CM

## 2017-04-12 MED ORDER — MEDROXYPROGESTERONE ACETATE 150 MG/ML IM SUSP
150.0000 mg | Freq: Once | INTRAMUSCULAR | Status: AC
  Administered 2017-04-12: 150 mg via INTRAMUSCULAR

## 2017-04-12 NOTE — Progress Notes (Signed)
Pt here for Depo. Pt is not sexually active so no pregnancy test was done. Pt tolerated shot well. Return in 12 weeks for next shot. [place]

## 2017-04-14 IMAGING — DX DG SHOULDER 2+V*R*
3 series · 3 of 3 positions shown · non-contrast
Comparison: None.

CLINICAL DATA: Right shoulder pain for 2 months, no known injury,
initial encounter

EXAM:
RIGHT SHOULDER - 2+ VIEW

[shoulder ap]
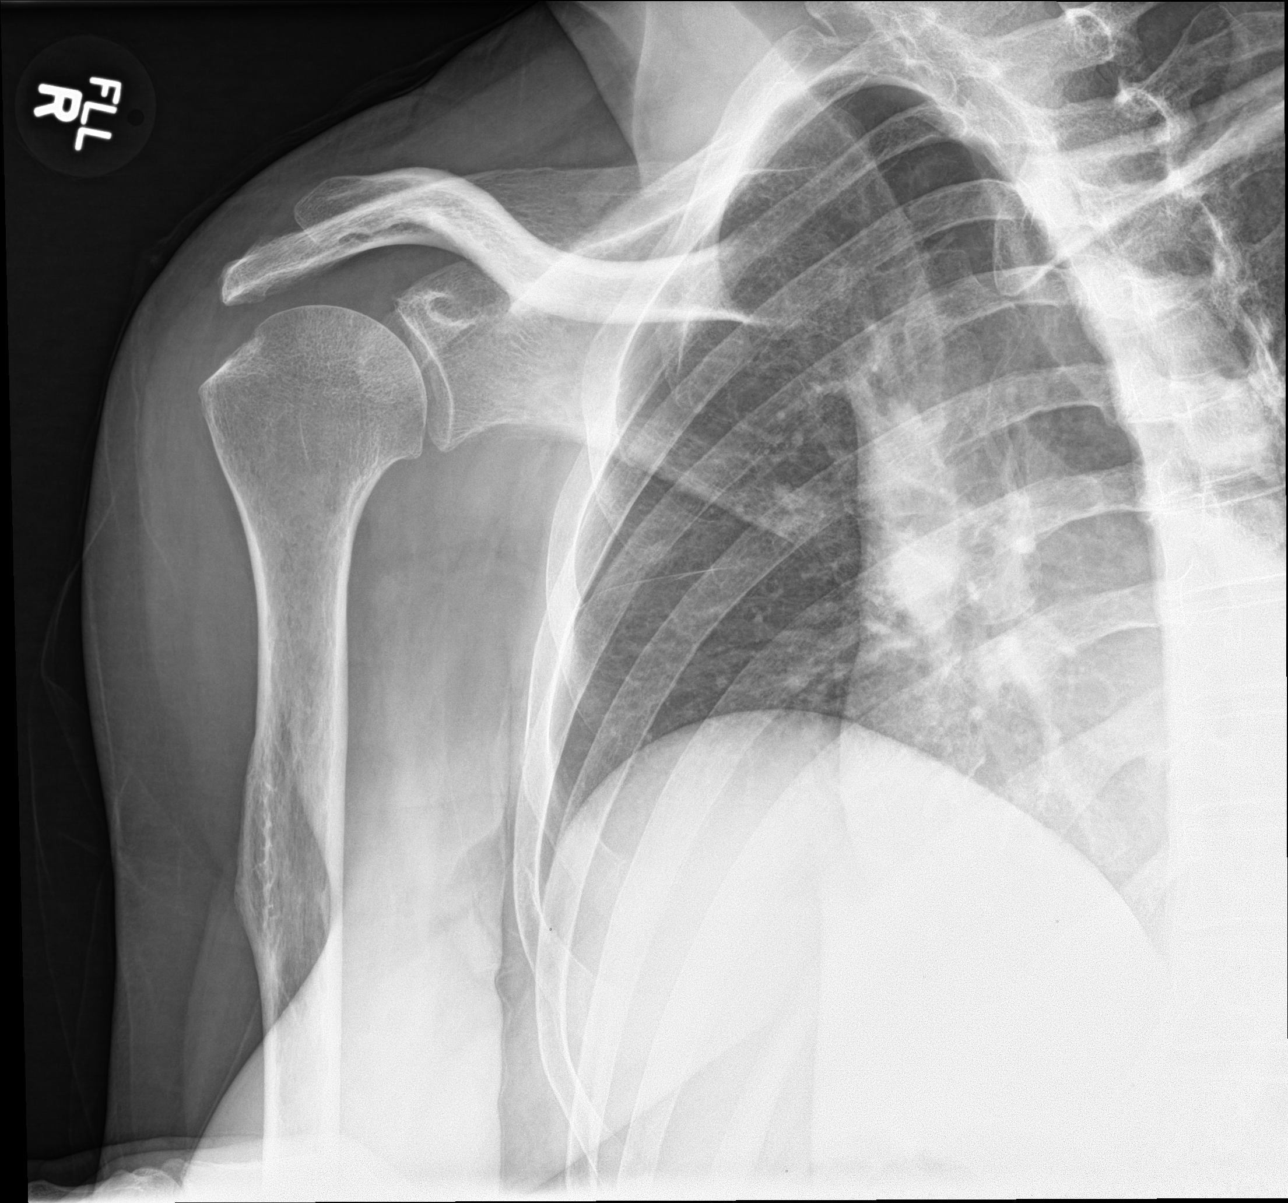

[shoulder y view]
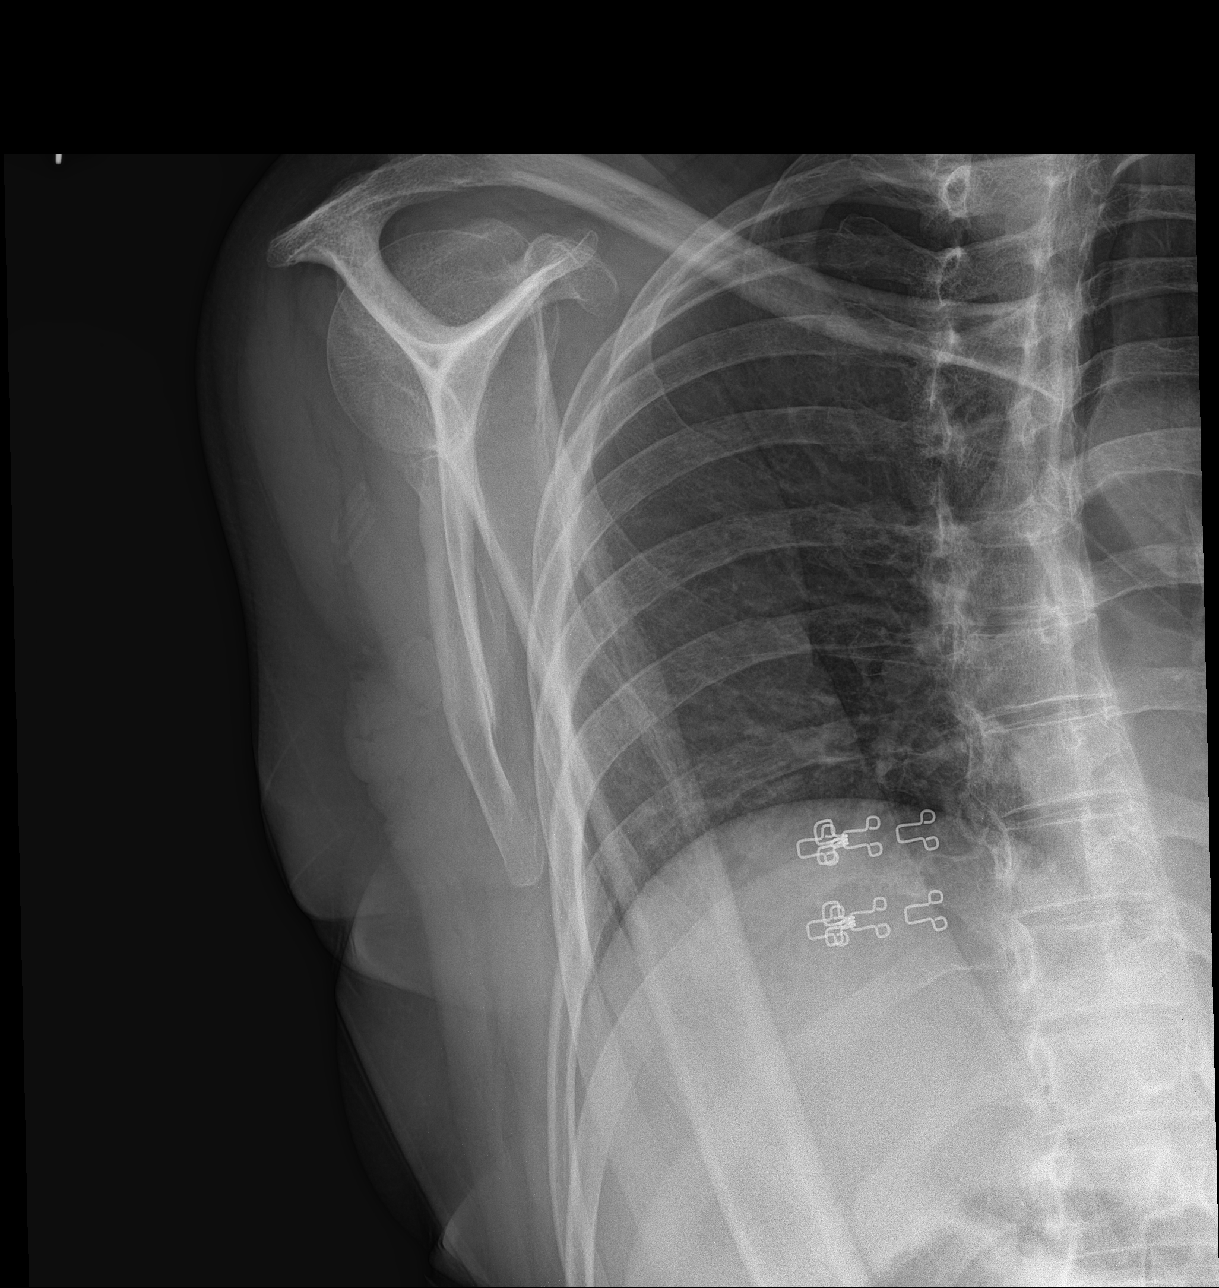

[shoulder axillary]
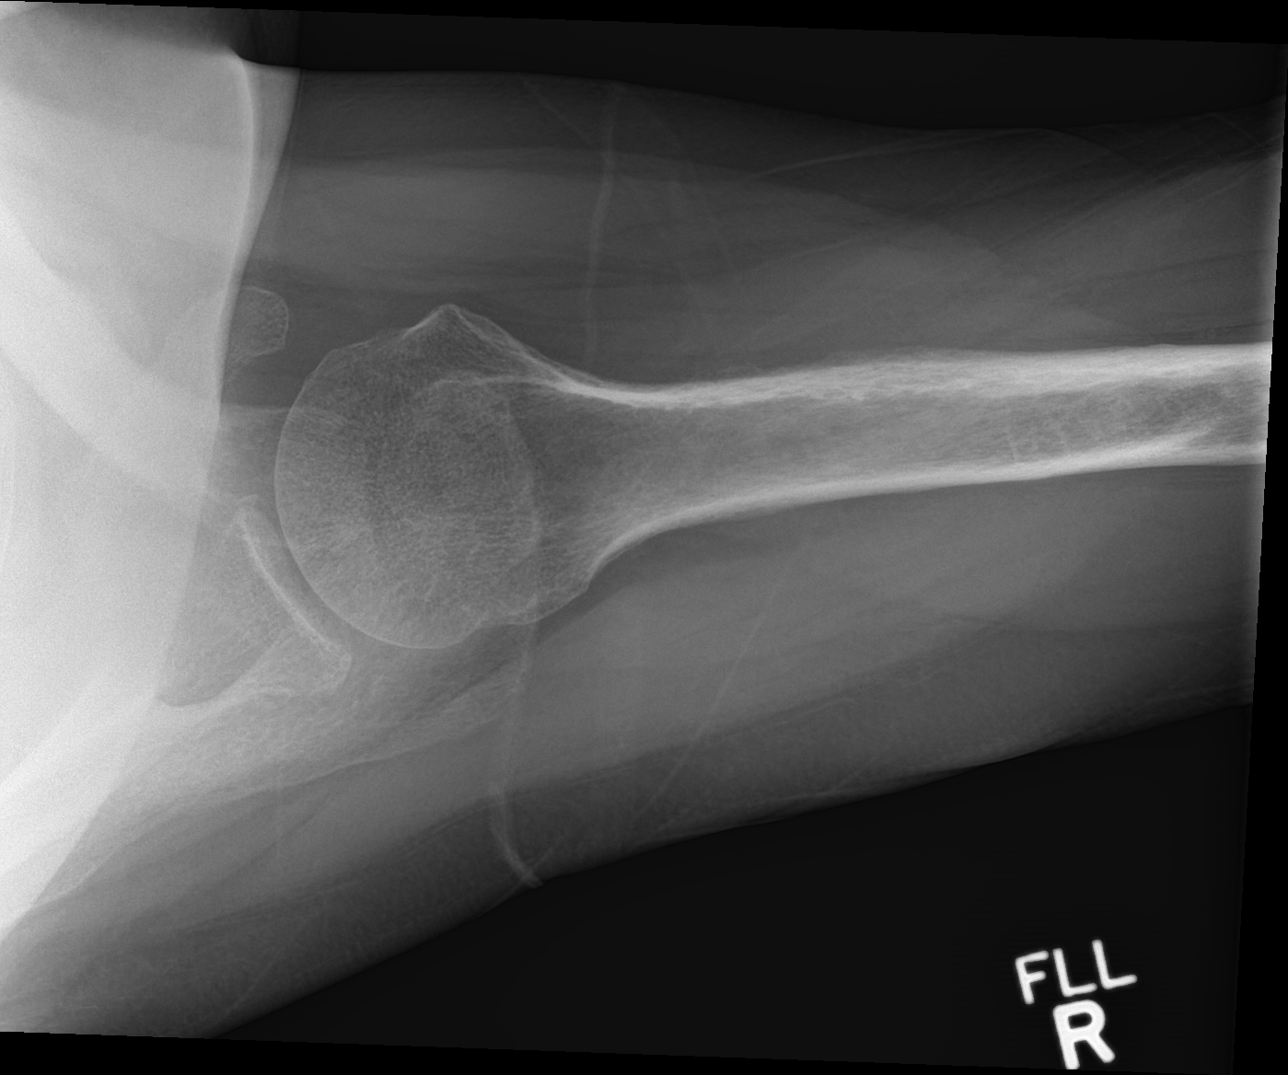

[3 of 3 positions shown; findings below may reference images not displayed]

FINDINGS: There is no evidence of fracture or dislocation. There is no
evidence of arthropathy or other focal bone abnormality. Soft
tissues are unremarkable.
IMPRESSION: No acute abnormality noted.

## 2017-05-30 ENCOUNTER — Ambulatory Visit: Payer: Medicare Other | Admitting: Family Medicine

## 2017-06-06 ENCOUNTER — Ambulatory Visit: Payer: Medicare Other | Admitting: Family Medicine

## 2017-06-19 ENCOUNTER — Other Ambulatory Visit: Payer: Self-pay | Admitting: Family Medicine

## 2017-06-21 ENCOUNTER — Other Ambulatory Visit: Payer: Self-pay | Admitting: Family Medicine

## 2017-06-21 NOTE — Telephone Encounter (Signed)
Seen [phone]

## 2017-06-24 ENCOUNTER — Encounter (HOSPITAL_COMMUNITY): Payer: Self-pay

## 2017-06-24 ENCOUNTER — Other Ambulatory Visit: Payer: Self-pay

## 2017-06-24 ENCOUNTER — Emergency Department (HOSPITAL_COMMUNITY): Payer: Medicare Other

## 2017-06-24 ENCOUNTER — Emergency Department (HOSPITAL_COMMUNITY)
Admission: EM | Admit: 2017-06-24 | Discharge: 2017-06-24 | Disposition: A | Discharge status: Home / Self Care | Payer: Medicare Other | Attending: Emergency Medicine | Admitting: Emergency Medicine

## 2017-06-24 DIAGNOSIS — Z79899 Other long term (current) drug therapy: Secondary | ICD-10-CM | POA: Insufficient documentation

## 2017-06-24 DIAGNOSIS — S0083XA Contusion of other part of head, initial encounter: Secondary | ICD-10-CM | POA: Diagnosis not present

## 2017-06-24 DIAGNOSIS — R401 Stupor: Secondary | ICD-10-CM | POA: Diagnosis not present

## 2017-06-24 DIAGNOSIS — S01511A Laceration without foreign body of lip, initial encounter: Secondary | ICD-10-CM | POA: Insufficient documentation

## 2017-06-24 DIAGNOSIS — G10 Huntington's disease: Secondary | ICD-10-CM | POA: Diagnosis not present

## 2017-06-24 DIAGNOSIS — W0110XA Fall on same level from slipping, tripping and stumbling with subsequent striking against unspecified object, initial encounter: Secondary | ICD-10-CM | POA: Insufficient documentation

## 2017-06-24 DIAGNOSIS — E119 Type 2 diabetes mellitus without complications: Secondary | ICD-10-CM | POA: Insufficient documentation

## 2017-06-24 DIAGNOSIS — F0280 Dementia in other diseases classified elsewhere without behavioral disturbance: Secondary | ICD-10-CM | POA: Diagnosis not present

## 2017-06-24 DIAGNOSIS — Y999 Unspecified external cause status: Secondary | ICD-10-CM | POA: Insufficient documentation

## 2017-06-24 DIAGNOSIS — I10 Essential (primary) hypertension: Secondary | ICD-10-CM | POA: Diagnosis not present

## 2017-06-24 DIAGNOSIS — R000 Tachycardia, unspecified: Secondary | ICD-10-CM | POA: Diagnosis not present

## 2017-06-24 DIAGNOSIS — Y929 Unspecified place or not applicable: Secondary | ICD-10-CM | POA: Insufficient documentation

## 2017-06-24 DIAGNOSIS — S0993XA Unspecified injury of face, initial encounter: Secondary | ICD-10-CM | POA: Diagnosis present

## 2017-06-24 DIAGNOSIS — Y939 Activity, unspecified: Secondary | ICD-10-CM | POA: Insufficient documentation

## 2017-06-24 DIAGNOSIS — W19XXXA Unspecified fall, initial encounter: Secondary | ICD-10-CM

## 2017-06-24 LAB — CBC WITH DIFFERENTIAL/PLATELET
BASOS PCT: 0 %
Basophils Absolute: 0 10*3/uL (ref 0.0–0.1)
Eosinophils Absolute: 0 10*3/uL (ref 0.0–0.7)
Eosinophils Relative: 0 %
HCT: 44 % (ref 36.0–46.0)
Hemoglobin: 14 g/dL (ref 12.0–15.0)
LYMPHS ABS: 2.3 10*3/uL (ref 0.7–4.0)
Lymphocytes Relative: 27 %
MCH: 27.8 pg (ref 26.0–34.0)
MCHC: 31.8 g/dL (ref 30.0–36.0)
MCV: 87.3 fL (ref 78.0–100.0)
MONOS PCT: 5 %
Monocytes Absolute: 0.4 10*3/uL (ref 0.1–1.0)
NEUTROS PCT: 68 %
Neutro Abs: 5.7 10*3/uL (ref 1.7–7.7)
Platelets: 299 10*3/uL (ref 150–400)
RBC: 5.04 MIL/uL (ref 3.87–5.11)
RDW: 12.6 % (ref 11.5–15.5)
WBC: 8.5 10*3/uL (ref 4.0–10.5)

## 2017-06-24 LAB — COMPREHENSIVE METABOLIC PANEL
ALBUMIN: 4.3 g/dL (ref 3.5–5.0)
ALT: 15 U/L (ref 14–54)
ANION GAP: 12 (ref 5–15)
AST: 19 U/L (ref 15–41)
Alkaline Phosphatase: 71 U/L (ref 38–126)
BUN: 9 mg/dL (ref 6–20)
CALCIUM: 10.4 mg/dL — AB (ref 8.9–10.3)
CO2: 22 mmol/L (ref 22–32)
Chloride: 105 mmol/L (ref 101–111)
Creatinine, Ser: 1.22 mg/dL — ABNORMAL HIGH (ref 0.44–1.00)
GFR calc non Af Amer: 52 mL/min — ABNORMAL LOW (ref >60)
GLUCOSE: 147 mg/dL — AB (ref 65–99)
POTASSIUM: 3.8 mmol/L (ref 3.5–5.1)
SODIUM: 139 mmol/L (ref 135–145)
Total Bilirubin: 0.5 mg/dL (ref 0.3–1.2)
Total Protein: 8 g/dL (ref 6.5–8.1)

## 2017-06-24 LAB — LIPASE, BLOOD: Lipase: 70 U/L — ABNORMAL HIGH (ref 11–51)

## 2017-06-24 LAB — CBG MONITORING, ED: GLUCOSE-CAPILLARY: 144 mg/dL — AB (ref 65–99)

## 2017-06-24 MED ORDER — SODIUM CHLORIDE 0.9 % IV SOLN
INTRAVENOUS | Status: DC
  Administered 2017-06-24: 18:00:00 via INTRAVENOUS

## 2017-06-24 MED ORDER — SODIUM CHLORIDE 0.9 % IV BOLUS (SEPSIS)
1000.0000 mL | Freq: Once | INTRAVENOUS | Status: AC
  Administered 2017-06-24: 1000 mL via INTRAVENOUS

## 2017-06-24 NOTE — ED Provider Notes (Signed)
[facility] EMERGENCY DEPARTMENT Provider Note   CSN: [id number] Arrival date & time: 06/24/17  1502     History   Chief Complaint Chief Complaint  Patient presents with  . Fall    HPI Victoria [name] is a 47 y.o. female.  Patient has a history of cognitive impairment and Huntington's disease.  Also has a history of hypertension and diabetes patient had a fall from the sitting position shortly prior to arrival patient fell forward striking her face resulting in injury and laceration to her lower lip.  And some contusions to her left side of her face.  Family states immunizations are up-to-date.  Patient is nonverbal.  Patient arrived extremely tachycardic with a heart rate in the 180s and diaphoretic.  Family members state that she has had some mild diaphoresis on and off over the past 2 weeks.  They are not clear whether she was diaphoretic prior to the fall.  They said she had been acting normal otherwise for the past several weeks.  Patient eating his usual behavior has been baseline.      Past Medical History:  Diagnosis Date  . Anxiety   . Diabetes mellitus   . Dyslipidemia   . Fibroids 06/10/2014  . Huntington's disease ([place])   . Hypertension   . Menorrhagia with regular cycle 06/02/2014  . Mild cognitive impairment   . Obesity   . Perennial allergic rhinitis   . Sleep apnea    cannot tolerate CPAP.    Patient Active Problem List   Diagnosis Date Noted  . Skin breakdown 01/16/2017  . Weight loss, abnormal 01/16/2017  . Urinary incontinence in female 08/13/2015  . 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 Dysphagia 10/14/2014  . Dandruff 09/07/2014  . Intramural leiomyoma of uterus 06/30/2014  . Heavy menses 12/03/2013  . Falls 08/03/2011  . Lack of coordination 08/03/2011  . Muscle weakness (generalized) 08/03/2011  . HUNTINGTON'S DISEASE 03/15/2009  . Unspecified visual loss 02/19/2008  . Hyperlipidemia  with target LDL less than 100 07/19/2007  . Neurodegenerative cognitive impairment 07/19/2007    History reviewed. No pertinent surgical history.  OB History    Gravida Para Term Preterm AB Living   1 1 1     1    SAB TAB Ectopic Multiple Live Births           1       Home Medications    Prior to Admission medications   Medication Sig Start Date End Date Taking? Authorizing Provider  citalopram (CELEXA) 20 MG tablet TAKE ONE (1) TABLET BY MOUTH EVERY DAY 01/19/17   Raylene Everts, MD  Clobetasol Propionate (CLOBEX) 0.05 % shampoo Apply topically twice daily Patient taking differently: Apply topically twice daily prn 03/19/13   Fayrene Helper, MD  galantamine (RAZADYNE ER) 24 MG 24 hr capsule TAKE ONE (1) CAPSULE EACH DAY 05/25/16   Fayrene Helper, MD  Incontinence Supply Disposable (UNDERPADS) MISC 30 per month 08/10/16   Fayrene Helper, MD  lovastatin (MEVACOR) 20 MG tablet TAKE ONE TABLET BY MOUTH EVERY NIGHT AT BEDTIME 06/21/17   Fayrene Helper, MD  medroxyPROGESTERone (DEPO-PROVERA) 150 MG/ML injection Inject 1 mL (150 mg total) into the muscle every 3 (three) months. 11/16/16   Cresenzo-Dishmon, Joaquim Lai, CNM  megestrol (MEGACE) 40 MG tablet TAKE ONE TABLET BY MOUTH DAILY ON DAYS OF SPOTTING. IF SPOTTING CONTINUES ON ONCE DAILY MAY INCREASE TO TWICE A DAY  11/14/16   [name], MD  memantine (NAMENDA) 10 MG tablet TAKE ONE TABLET TWICE DAILY 02/17/17   Fayrene Helper, MD  traZODone (DESYREL) 50 MG tablet TAKE ONE TABLET BY MOUTH EVERY NIGHT AT BEDTIME 06/21/17   Fayrene Helper, MD  UNABLE TO FIND Pullups  100/m 08/10/16   Fayrene Helper, MD    Family History Family History  Problem Relation Age of Onset  . Huntington's disease Father   . Diabetes Mother   . Hypertension Mother   . Hyperlipidemia Mother   . Huntington's disease Brother   . Alzheimer's disease Maternal Grandmother   . Cancer Unknown        family history   . Diabetes Unknown         family history   . Heart defect Unknown        family history     Social History Social History   Tobacco Use  . Smoking status: Never Smoker  . Smokeless tobacco: Never Used  Substance Use Topics  . Alcohol use: No  . Drug use: No     Allergies   Diphenhydramine hcl   Review of Systems Review of Systems  Unable to perform ROS: Patient nonverbal     Physical Exam Updated Vital Signs BP (!) 137/91   Pulse (!) 134   Temp 99.5 F (37.5 C) (Axillary)   Resp 15   SpO2 100%   Physical Exam  Constitutional: She appears well-developed and well-nourished. She appears distressed.  Slightly diaphoretic.  HENT:  Nose: Nose normal.  Left lateral aspect of the face around the orbit with contusion and abrasion and.  Teeth appear to be intact as best can be evaluated.  Gums are healthy teeth seem to be well kept and very healthy.  Lower lip on the left side inner side of the lower lip with sort of a mutilated multi-dimensional laceration no active bleeding.  Outside lower part very small 3 mm laceration.  No involvement of the vermilion border.  Upper lip normal.  Eyes: Conjunctivae and EOM are normal. Pupils are equal, round, and reactive to light.  Neck: Neck supple.  Cardiovascular: Normal rate, regular rhythm and normal heart sounds.  Pulmonary/Chest: Effort normal and breath sounds normal. No respiratory distress. She has no wheezes. She has no rales.  Abdominal: Soft. Bowel sounds are normal. There is no tenderness.  Musculoskeletal: Normal range of motion. She exhibits no deformity.  Neurological: She is alert.  Baseline mental status moves all 4 extremities spontaneously.  Moves her head left to right spontaneously.  Skin: Skin is warm. Capillary refill takes less than 2 seconds. No erythema.  Nursing note and vitals reviewed.    ED Treatments / Results  Labs (all labs ordered are listed, but only abnormal results are displayed) Labs Reviewed    COMPREHENSIVE METABOLIC PANEL - Abnormal; Notable for the following components:      Result Value   Glucose, Bld 147 (*)    Creatinine, Ser 1.22 (*)    Calcium 10.4 (*)    GFR calc non Af Amer 52 (*)    All other components within normal limits  LIPASE, BLOOD - Abnormal; Notable for the following components:   Lipase 70 (*)    All other components within normal limits  CBG MONITORING, ED - Abnormal; Notable for the following components:   Glucose-Capillary 144 (*)    All other components within normal limits  CBC WITH DIFFERENTIAL/PLATELET    EKG  EKG Interpretation  Date/Time:  Saturday June 24 2017 15:33:01 EST Ventricular Rate:  167 PR Interval:    QRS Duration: 79 QT Interval:  332 QTC Calculation: 554 R Axis:   -32 Text Interpretation:  Sinus tachycardia Left axis deviation Borderline low voltage, extremity leads Prolonged QT interval Baseline wander in lead(s) III Confirmed by Fredia Sorrow [phone]) on 06/24/2017 3:36:29 PM       Radiology Dg Chest Port 1 View  Result Date: 06/24/2017 CLINICAL DATA:  Tachycardia.  Unresponsive. EXAM: PORTABLE CHEST 1 VIEW COMPARISON:  None. FINDINGS: 1655 hours. Low volume film. The cardiopericardial silhouette is within normal limits for size. The lungs are clear without focal pneumonia, edema, pneumothorax or pleural effusion. The visualized bony structures of the thorax are intact. Telemetry leads overlie the chest. IMPRESSION: Low volume film without acute cardiopulmonary findings. Electronically Signed   By: Misty Stanley M.D.   On: 06/24/2017 17:50    Procedures Procedures (including critical care time)  Medications Ordered in ED Medications  0.9 %  sodium chloride infusion ( Intravenous New Bag/Given 06/24/17 1742)  sodium chloride 0.9 % bolus 1,000 mL (0 mLs Intravenous Stopped 06/24/17 1742)     Initial Impression / Assessment and Plan / ED Course  I have reviewed the triage vital signs and the nursing  notes.  Pertinent labs & imaging results that were available during my care of the patient were reviewed by me and considered in my medical decision making (see chart for details).   Patient challenging to evaluate.  Patient appeared to be very frightened heart rate got as high as almost 180 EKG in sinus monitor consistent with sinus tach.  Blood pressure was also initially very high.  Clinically came to conclusion that patient may just be extremely anxious and fearful of being in this environment.  Family members state that patient cannot be sedated.  Not exactly clear but they states she cannot P.  Stated that she would not tolerate CT of head face.  Stated that her tetanus is up-to-date.  Opted to observe the patient heart rate slowly came down without any significant intervention from the 180s down to about 105.  Oxygen saturations have been in the upper 90s.  Blood pressure also improved on its own from a high of 178/150 down to 145/90.  Patient also appeared more comfortable and more relaxed.   Discussed with family the options on how to deal with the lower lip inner laceration.  And we concluded that was probably best to allow it to heal on its own.  Would be extremely difficult to repair the multi faceted laceration without sedation.  Family does not want that.  I do clinically feel that there is an excellent chance that this will heal fine on its own.  And probably be less stressful for the patient.  Labs were done due to her initial presentations without any significant findings.  Urinalysis was not done.  Chest x-ray was negative for pneumonia no significant leukocytosis.  Lipase slightly elevated but patient's abdomen is soft and she is been eating very normally.   Feel that patient was very stressed on presentation.  Also the sweating also resolved.  Do recommend close follow-up with her primary care doctor this week.   Recommend just wiping the inner lip laceration with a warm  washcloth.  Good dental hygiene.  And returning for any new or worse symptoms.    Final Clinical Impressions(s) / ED Diagnoses   Final diagnoses:  Fall, initial  encounter  Contusion of face, initial encounter  Lip laceration, initial encounter    ED Discharge Orders    None       Fredia Sorrow, MD 06/24/17 [phone]

## 2017-06-24 NOTE — ED Notes (Signed)
ED Provider at bedside. 

## 2017-06-24 NOTE — ED Triage Notes (Signed)
Mother states patient was in bathroom this morning and lost balance and fell. States patient hit face on bathroom floor. Bleeding to bottom lip noted.

## 2017-06-24 NOTE — Discharge Instructions (Signed)
Follow-up with Dr. Moshe Cipro for recheck later this week.  Today's labs without significant abnormalities chest x-ray negative.  Wound care as we discussed.  Return for any new or worse symptoms.

## 2017-07-03 ENCOUNTER — Ambulatory Visit (INDEPENDENT_AMBULATORY_CARE_PROVIDER_SITE_OTHER): Payer: Medicare Other | Admitting: Family Medicine

## 2017-07-03 ENCOUNTER — Encounter: Payer: Self-pay | Admitting: Family Medicine

## 2017-07-03 VITALS — HR 126 | Resp 16 | Ht 64.0 in

## 2017-07-03 DIAGNOSIS — Z23 Encounter for immunization: Secondary | ICD-10-CM

## 2017-07-03 DIAGNOSIS — M6281 Muscle weakness (generalized): Secondary | ICD-10-CM

## 2017-07-03 DIAGNOSIS — Z09 Encounter for follow-up examination after completed treatment for conditions other than malignant neoplasm: Secondary | ICD-10-CM | POA: Diagnosis not present

## 2017-07-03 DIAGNOSIS — R1319 Other dysphagia: Secondary | ICD-10-CM | POA: Diagnosis not present

## 2017-07-03 DIAGNOSIS — R279 Unspecified lack of coordination: Secondary | ICD-10-CM | POA: Diagnosis not present

## 2017-07-03 DIAGNOSIS — G10 Huntington's disease: Secondary | ICD-10-CM | POA: Diagnosis not present

## 2017-07-03 DIAGNOSIS — R634 Abnormal weight loss: Secondary | ICD-10-CM

## 2017-07-03 MED ORDER — TRAZODONE HCL 50 MG PO TABS: 50.0000 mg | ORAL_TABLET | Freq: Every day | ORAL | 0 refills | Status: DC

## 2017-07-03 MED ORDER — MEMANTINE HCL 10 MG PO TABS: 10.0000 mg | ORAL_TABLET | Freq: Two times a day (BID) | ORAL | 0 refills | Status: DC

## 2017-07-03 MED ORDER — LOVASTATIN 20 MG PO TABS: 20.0000 mg | ORAL_TABLET | Freq: Every day | ORAL | 0 refills | Status: DC

## 2017-07-03 NOTE — Assessment & Plan Note (Signed)
Seen in ED on 01/05 due to fall, she has been experiencing increased instability, jerking movements and poor balance , also poor appetite. Has neurology appt in the next week. All due to progression of huntington's unfortunately Need for increased sedation apparent and will defer to neurology. Reassured caregivers, 53 mother and aunt 0 of ever present and continued supportive care ) All questions were answered , some apparent denial by mother voiced, stating she is not really knowledgeable about Babe's illness

## 2017-07-03 NOTE — Assessment & Plan Note (Addendum)
Complication of neurologic disease placing pt at high fall risk, safety becoming  an increasing problem, patient has had her first significant fall resulting in a split lower lip an an ED visit 1 week ago. Ability to leave her on her own is decreased

## 2017-07-03 NOTE — Assessment & Plan Note (Addendum)
Progressive deterioration on function noted at visit and reported by family. Victoria [name]'s oral intake continues to decrease, her ability to chew is less, she is losing weight. She jerks more and is more agitated and it is   More difficult to movements placing her at increased fall risk. Neurology evaluation and inpiut upcoming will be beneficial

## 2017-07-03 NOTE — Patient Instructions (Signed)
F/U  End May, call if you need me sooner    Flu vaccine today  Tsh if Dr Merlene Laughter orders labs  Careful no more falls  Happy New Year!!

## 2017-07-03 NOTE — Assessment & Plan Note (Signed)
Ongoing weight loss due to poor oral intake due to chronic neurologic disease

## 2017-07-03 NOTE — Progress Notes (Signed)
   Victoria [name]     MRN: [id number]      DOB: 12-26-1970   HPI Ms. Innocent is here for follow up of ED visit on 06/25/2017 due to a fall. This is her first significant fall, she split her lower lip , which is now completely healed. Caregivers report increasing difficulty managing her in the home due to imbalance , jerking and poor food intake with weight loss. They have a neurology appointment scheduled within the next week. She has been diagnosed with Huntington disease for over 15 years, and this is progressing ROS See HPI History from family  Denies skin break down or rash.   PE  Pulse (!) 126   Resp 16   Ht 5\' 4"  (1.626 m)   SpO2 97%   BMI 24.55 kg/m   Patient alert and oriented and in no cardiopulmonary distress.  HEENT: No facial asymmetry, EOMI,   oropharynx pink and moist.  Neck decreased ROM no JVD, no mass.  Chest: Clear to auscultation bilaterally.  CVS: S1, S2 no murmurs, no S3.Regular rate.  ABD: Soft non tender.   Ext: No edema  MS: decreased ROM spine, shoulders, hips and knees.  Skin: Intact, lower lip laceration healed  Psych: poor eye contact, flat affect.   Assessment & Plan Encounter for examination following treatment at hospital Seen in ED on 01/05 due to fall, she has been experiencing increased instability, jerking movements and poor balance , also poor appetite. Has neurology appt in the next week. All due to progression of huntington's unfortunately Need for increased sedation apparent and will defer to neurology. Reassured caregivers, 40 mother and aunt 0 of ever present and continued supportive care ) All questions were answered , some apparent denial by mother voiced, stating she is not really knowledgeable about Yamaris's illness  Weight loss, abnormal Ongoing weight loss due to poor oral intake due to chronic neurologic disease   Lack of coordination Complication of neurologic disease placing pt at high fall risk, safety becoming   an increasing problem, patient has had her first significant fall resulting in a split lower lip an an ED visit 1 week ago. Ability to leave her on her own is decreased   HUNTINGTON'S DISEASE Progressive deterioration on function noted at visit and reported by family. Keana's oral intake continues to decrease, her ability to chew is less, she is losing weight. She jerks more and is more agitated and it is   More difficult to movements placing her at increased fall risk. Neurology evaluation and inpiut upcoming will be beneficial  Dysphagia Progressively worsening with reduced oral intake and weight loss  Need for influenza vaccination After obtaining informed consent, the vaccine is  administered by LPN.

## 2017-07-03 NOTE — Assessment & Plan Note (Signed)
After obtaining informed consent, the vaccine is  administered by LPN.  

## 2017-07-03 NOTE — Assessment & Plan Note (Signed)
Progressively worsening with reduced oral intake and weight loss

## 2017-07-05 ENCOUNTER — Ambulatory Visit (INDEPENDENT_AMBULATORY_CARE_PROVIDER_SITE_OTHER): Payer: Medicare Other

## 2017-07-05 VITALS — Wt 145.0 lb

## 2017-07-05 DIAGNOSIS — R260 Ataxic gait: Secondary | ICD-10-CM | POA: Diagnosis not present

## 2017-07-05 DIAGNOSIS — Z79899 Other long term (current) drug therapy: Secondary | ICD-10-CM | POA: Diagnosis not present

## 2017-07-05 DIAGNOSIS — G10 Huntington's disease: Secondary | ICD-10-CM | POA: Diagnosis not present

## 2017-07-05 DIAGNOSIS — Z3042 Encounter for surveillance of injectable contraceptive: Secondary | ICD-10-CM | POA: Diagnosis not present

## 2017-07-05 DIAGNOSIS — G4721 Circadian rhythm sleep disorder, delayed sleep phase type: Secondary | ICD-10-CM | POA: Diagnosis not present

## 2017-07-05 MED ORDER — MEDROXYPROGESTERONE ACETATE 150 MG/ML IM SUSP
150.0000 mg | Freq: Once | INTRAMUSCULAR | Status: AC
  Administered 2017-07-05: 150 mg via INTRAMUSCULAR

## 2017-07-05 NOTE — Progress Notes (Signed)
Pt here for depo shot 150 mg IM given rt deltoid. Tolerated well. Return 12 weeks for next shot.pad CMA

## 2017-07-05 NOTE — Addendum Note (Signed)
Addended by: Diona Fanti A on: 07/05/2017 02:14 PM   Modules accepted: Level of Service

## 2017-07-11 ENCOUNTER — Ambulatory Visit: Payer: Medicare Other | Admitting: Family Medicine

## 2017-07-24 ENCOUNTER — Telehealth: Payer: Self-pay | Admitting: Family Medicine

## 2017-07-24 NOTE — Telephone Encounter (Signed)
Patients mother Victoria [name].O.A Dropped off a letter during her appointment today requesting the following items for patient, items are covered by medicare. Please send to [employer] per [place] love-- case mgr. With aging and disability transit service. 1. Thicken - for beverages, supplement, and pureed foods. 2. Under pads- 2 bags 3. Perfit underwear Med- Needs 6 per day (Increase to 8 cs, was 4cs) 4.[employer] 5. Handicap ramp

## 2017-07-25 NOTE — Telephone Encounter (Signed)
faxed

## 2017-08-21 ENCOUNTER — Telehealth: Payer: Self-pay

## 2017-08-21 ENCOUNTER — Other Ambulatory Visit: Payer: Self-pay

## 2017-08-21 DIAGNOSIS — G10 Huntington's disease: Secondary | ICD-10-CM | POA: Diagnosis not present

## 2017-08-21 DIAGNOSIS — G4721 Circadian rhythm sleep disorder, delayed sleep phase type: Secondary | ICD-10-CM | POA: Diagnosis not present

## 2017-08-21 DIAGNOSIS — R260 Ataxic gait: Secondary | ICD-10-CM | POA: Diagnosis not present

## 2017-08-21 DIAGNOSIS — Z79899 Other long term (current) drug therapy: Secondary | ICD-10-CM | POA: Diagnosis not present

## 2017-08-21 MED ORDER — MEMANTINE HCL 10 MG PO TABS: 10.0000 mg | ORAL_TABLET | Freq: Two times a day (BID) | ORAL | 1 refills | Status: DC

## 2017-08-21 NOTE — Telephone Encounter (Signed)
memantine (NAMENDA) 10 MG tablet [[id number]] -  Pt needs refill please call to [facility] today.

## 2017-08-21 NOTE — Telephone Encounter (Signed)
Done

## 2017-08-24 ENCOUNTER — Ambulatory Visit: Payer: Medicare Other | Admitting: Family Medicine

## 2017-09-27 ENCOUNTER — Ambulatory Visit: Payer: Medicare Other

## 2017-09-29 ENCOUNTER — Ambulatory Visit (INDEPENDENT_AMBULATORY_CARE_PROVIDER_SITE_OTHER): Payer: Medicare Other

## 2017-09-29 VITALS — Wt 140.0 lb

## 2017-09-29 DIAGNOSIS — Z3042 Encounter for surveillance of injectable contraceptive: Secondary | ICD-10-CM | POA: Diagnosis not present

## 2017-09-29 MED ORDER — MEDROXYPROGESTERONE ACETATE 150 MG/ML IM SUSP
150.0000 mg | Freq: Once | INTRAMUSCULAR | Status: AC
  Administered 2017-09-29: 150 mg via INTRAMUSCULAR

## 2017-09-29 NOTE — Progress Notes (Signed)
Pt here for depo injection 150 mg IM given lt deltoid. Tolerated well. Return 12 week for next injection. Pad CMA

## 2017-10-03 ENCOUNTER — Telehealth: Payer: Self-pay | Admitting: Family Medicine

## 2017-10-03 NOTE — Telephone Encounter (Signed)
Called patient to r/s per r/s list. No answer, no voicemail. Will mail a letter.

## 2017-10-10 ENCOUNTER — Other Ambulatory Visit: Payer: Self-pay | Admitting: Family Medicine

## 2017-10-17 ENCOUNTER — Telehealth: Payer: Self-pay | Admitting: Family Medicine

## 2017-10-17 NOTE — Telephone Encounter (Signed)
Pt needs a new [facility] hosp bed, hers is currently broken, please send to  [facility]

## 2017-10-18 ENCOUNTER — Other Ambulatory Visit: Payer: Self-pay

## 2017-10-18 DIAGNOSIS — G10 Huntington's disease: Secondary | ICD-10-CM | POA: Diagnosis not present

## 2017-10-18 DIAGNOSIS — R260 Ataxic gait: Secondary | ICD-10-CM | POA: Diagnosis not present

## 2017-10-18 DIAGNOSIS — G319 Degenerative disease of nervous system, unspecified: Secondary | ICD-10-CM

## 2017-10-18 DIAGNOSIS — G4721 Circadian rhythm sleep disorder, delayed sleep phase type: Secondary | ICD-10-CM | POA: Diagnosis not present

## 2017-10-18 DIAGNOSIS — Z79899 Other long term (current) drug therapy: Secondary | ICD-10-CM | POA: Diagnosis not present

## 2017-10-18 NOTE — Telephone Encounter (Signed)
Sent in

## 2017-11-06 ENCOUNTER — Ambulatory Visit: Payer: Medicare Other | Admitting: Family Medicine

## 2017-11-15 ENCOUNTER — Other Ambulatory Visit: Payer: Self-pay

## 2017-11-15 ENCOUNTER — Ambulatory Visit (INDEPENDENT_AMBULATORY_CARE_PROVIDER_SITE_OTHER): Payer: Medicare Other | Admitting: Family Medicine

## 2017-11-15 ENCOUNTER — Encounter: Payer: Self-pay | Admitting: Family Medicine

## 2017-11-15 DIAGNOSIS — M6281 Muscle weakness (generalized): Secondary | ICD-10-CM

## 2017-11-15 DIAGNOSIS — G10 Huntington's disease: Secondary | ICD-10-CM | POA: Diagnosis not present

## 2017-11-15 DIAGNOSIS — F329 Major depressive disorder, single episode, unspecified: Secondary | ICD-10-CM | POA: Diagnosis not present

## 2017-11-15 DIAGNOSIS — F0393 Unspecified dementia, unspecified severity, with mood disturbance: Secondary | ICD-10-CM

## 2017-11-15 DIAGNOSIS — F0280 Dementia in other diseases classified elsewhere without behavioral disturbance: Secondary | ICD-10-CM

## 2017-11-15 MED ORDER — MEMANTINE HCL 10 MG PO TABS: 10.0000 mg | ORAL_TABLET | Freq: Two times a day (BID) | ORAL | 1 refills | Status: DC

## 2017-11-15 NOTE — Patient Instructions (Addendum)
Wellness with nurse in September  Physical exAM WITH MD IN EARLY DECEMBER  vERY GOOD EXAM  I WILL TRY TO GET YOU A HOSPITAL BED

## 2017-11-23 ENCOUNTER — Other Ambulatory Visit: Payer: Self-pay

## 2017-11-23 ENCOUNTER — Telehealth: Payer: Self-pay | Admitting: Family Medicine

## 2017-11-23 MED ORDER — UNABLE TO FIND: 0 refills | Status: DC

## 2017-11-23 NOTE — Telephone Encounter (Signed)
Pt needs thicken for food for the month and [place] needs a DX code... Also can you check on the bed

## 2017-11-23 NOTE — Telephone Encounter (Signed)
I don't see this on her med list. I can send it in if you want me to, I just need to know the directions. Thanks

## 2017-11-27 ENCOUNTER — Encounter: Payer: Self-pay | Admitting: Family Medicine

## 2017-11-27 NOTE — Assessment & Plan Note (Addendum)
Managed by neurology, progressively worsening per the course of the illness

## 2017-11-27 NOTE — Assessment & Plan Note (Addendum)
Progressive generalized  weakness with increased  Fall risk. Difficulty changing patient's position while  lying in the bed and head of bed needs to be elevated at or  Above 30 degress to reduce risk of aspiration. H/o repeated trauma to body by uncontrolled jerking movements which occur even while in the bed. Pt needs and will benefit from a hospital bed

## 2017-11-27 NOTE — Assessment & Plan Note (Signed)
Pt requires positioning I ways not feasible in an ordinary bed and has generalized muscle weakness because of her disease , she needs a hospital bed

## 2017-11-27 NOTE — Progress Notes (Signed)
   Victoria [name]     MRN: [id number]      DOB: 1971/05/27   HPI Ms. Caldeira is here for evaluation for hospital bed, and follow up and re-evaluation of chronic medical conditions and  medication management  Cleotilde's Mother and her Elenor Legato are her caregivers, and they report continuing deterioration in her overall health, . Her mobility has deteriorated , she is unable to safely raise herself from the sitting to standing position as well as out of a bed, but repeatedly attempts this with high risk of self injury. There have been many episodes where she has banged herself on her current bed and the condition of the bed is very poor. They asses it as unsafe and not the correct equipment for her with her neurologic condition Zahari does qualify for a hospital bed because of her diagnosis of Huntington's disease her mobility is extremely limited and she has very spastic muscles, this  Therefore leads to her requiring positioning in ways not feasible with an ordinary bed . Also to elevate her upper body above 30 degrees requires use of a hospital bed  And this is needed to help reduce risk of aspiration. She also requires positioning I of the body in ways not feasible with an ordinary bed to alleviate pain Appetite fair with risk of choking  ROS See HPI History from caregivers Denies recent fever or chills. Denies , nasal congestion,  or sore throat. Denies chest congestion, productive cough or wheezing. Denies leg swelling Denies a vomiting,diarrhea or constipation.   C/o stiffness and immobility with high fall risk  Denies skin break down or rash.   PE  BP 110/70 (BP Location: Left Arm, Patient Position: Sitting, Cuff Size: Normal)   Pulse 100   Wt 142 lb (64.4 kg)   SpO2 97%   BMI 24.37 kg/m   Patient alert  and in no cardiopulmonary distress.  HEENT: No facial asymmetry, EOMI,   oropharynx pink and moist.  Neck decreased ROM no JVD, no mass.  Chest: Clear to auscultation  bilaterally.  CVS: S1, S2 no murmurs, no S3.Regular rate.  ABD: Soft non tender.   Ext: No edema  MS: decreased  ROM spine, shoulders, hips and knees.  Skin: Intact, no ulcerations or rash noted.  .  CNS: CN 2-12 intact,grade 3  power,  Decreased tone throughout  Assessment & Plan  HUNTINGTON'S DISEASE Progressive generalized  weakness with increased  Fall risk. Difficulty changing patient's position while  lying in the bed and head of bed needs to be elevated at or  Above 30 degress to reduce risk of aspiration. H/o repeated trauma to body by uncontrolled jerking movements which occur even while in the bed. 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

## 2017-11-27 NOTE — Assessment & Plan Note (Signed)
Managed by neurology, no crying spells noted by caregivers and family

## 2017-12-01 ENCOUNTER — Other Ambulatory Visit: Payer: Self-pay

## 2017-12-01 MED ORDER — STARCH-MALTODEXTRIN PO POWD: ORAL | 11 refills | Status: AC

## 2017-12-01 NOTE — Telephone Encounter (Signed)
Dx code is G10  4 teaspoons per 6 ounces of liquid 1 cannister per month refill x 11 Thick-it 2 ( bed is on the way, just spoke to both Mom and Aunty)

## 2017-12-01 NOTE — Telephone Encounter (Signed)
Done

## 2017-12-12 ENCOUNTER — Other Ambulatory Visit: Payer: Self-pay | Admitting: Family Medicine

## 2017-12-22 ENCOUNTER — Ambulatory Visit: Payer: Medicare Other

## 2017-12-28 ENCOUNTER — Telehealth: Payer: Self-pay | Admitting: Adult Health

## 2017-12-28 ENCOUNTER — Ambulatory Visit (INDEPENDENT_AMBULATORY_CARE_PROVIDER_SITE_OTHER): Payer: Medicare Other

## 2017-12-28 VITALS — Ht 64.0 in | Wt 142.0 lb

## 2017-12-28 DIAGNOSIS — Z3042 Encounter for surveillance of injectable contraceptive: Secondary | ICD-10-CM | POA: Diagnosis not present

## 2017-12-28 MED ORDER — MEDROXYPROGESTERONE ACETATE 150 MG/ML IM SUSP
150.0000 mg | Freq: Once | INTRAMUSCULAR | Status: AC
  Administered 2017-12-28: 150 mg via INTRAMUSCULAR

## 2017-12-28 NOTE — Telephone Encounter (Signed)
Patient's care giver called stating that pt needs a refill of her Depo before her appointment this afternoon. [name] caregiver called stating that she has called the pharmacy and that her prescription has run out since April. Please contact pt went sent

## 2017-12-28 NOTE — Progress Notes (Signed)
PT her for depo injection 150 mg IM given rt deltoid. Tolerated well. Return 12 week for next injection. Pad CMA

## 2018-01-22 DIAGNOSIS — G4721 Circadian rhythm sleep disorder, delayed sleep phase type: Secondary | ICD-10-CM | POA: Diagnosis not present

## 2018-01-22 DIAGNOSIS — R260 Ataxic gait: Secondary | ICD-10-CM | POA: Diagnosis not present

## 2018-01-22 DIAGNOSIS — Z79899 Other long term (current) drug therapy: Secondary | ICD-10-CM | POA: Diagnosis not present

## 2018-01-22 DIAGNOSIS — G10 Huntington's disease: Secondary | ICD-10-CM | POA: Diagnosis not present

## 2018-02-21 DIAGNOSIS — G10 Huntington's disease: Secondary | ICD-10-CM | POA: Diagnosis not present

## 2018-02-21 DIAGNOSIS — G4721 Circadian rhythm sleep disorder, delayed sleep phase type: Secondary | ICD-10-CM | POA: Diagnosis not present

## 2018-02-21 DIAGNOSIS — R260 Ataxic gait: Secondary | ICD-10-CM | POA: Diagnosis not present

## 2018-02-21 DIAGNOSIS — Z79899 Other long term (current) drug therapy: Secondary | ICD-10-CM | POA: Diagnosis not present

## 2018-03-05 ENCOUNTER — Other Ambulatory Visit: Payer: Self-pay

## 2018-03-05 ENCOUNTER — Ambulatory Visit (INDEPENDENT_AMBULATORY_CARE_PROVIDER_SITE_OTHER): Payer: Medicare Other

## 2018-03-05 VITALS — BP 122/84 | HR 123 | Resp 16 | Ht 64.0 in

## 2018-03-05 DIAGNOSIS — Z23 Encounter for immunization: Secondary | ICD-10-CM | POA: Diagnosis not present

## 2018-03-05 DIAGNOSIS — L909 Atrophic disorder of skin, unspecified: Secondary | ICD-10-CM

## 2018-03-05 DIAGNOSIS — Z0000 Encounter for general adult medical examination without abnormal findings: Secondary | ICD-10-CM | POA: Diagnosis not present

## 2018-03-05 DIAGNOSIS — R238 Other skin changes: Secondary | ICD-10-CM

## 2018-03-05 DIAGNOSIS — G10 Huntington's disease: Secondary | ICD-10-CM

## 2018-03-05 NOTE — Patient Instructions (Signed)
Victoria [name] , Thank you for taking time to come for your Medicare Wellness Visit. I appreciate your ongoing commitment to your health goals. Please review the following plan we discussed and let me know if I can assist you in the future.   Screening recommendations/referrals: Colonoscopy: n/a Mammogram: n/a Bone Density: n/a Recommended yearly ophthalmology/optometry visit for glaucoma screening and checkup Recommended yearly dental visit for hygiene and checkup  Vaccinations: Influenza vaccine: given today  Pneumococcal vaccine: up to date  Tdap vaccine: up to date  Shingles vaccine: n/a  Advanced directives: form given   Conditions/risks identified: done   Next appointment: scheduled   Preventive Care 40-64 Years, Female Preventive care refers to lifestyle choices and visits with your health care provider that can promote health and wellness. What does preventive care include?  A yearly physical exam. This is also called an annual well check.  Dental exams once or twice a year.  Routine eye exams. Ask your health care provider how often you should have your eyes checked.  Personal lifestyle choices, including:  Daily care of your teeth and gums.  Regular physical activity.  Eating a healthy diet.  Avoiding tobacco and drug use.  Limiting alcohol use.  Practicing safe sex.  Taking low-dose aspirin daily starting at age 4.  Taking vitamin and mineral supplements as recommended by your health care provider. What happens during an annual well check? The services and screenings done by your health care provider during your annual well check will depend on your age, overall health, lifestyle risk factors, and family history of disease. Counseling  Your health care provider may ask you questions about your:  Alcohol use.  Tobacco use.  Drug use.  Emotional well-being.  Home and relationship well-being.  Sexual activity.  Eating habits.  Work and work  Statistician.  Method of birth control.  Menstrual cycle.  Pregnancy history. Screening  You may have the following tests or measurements:  Height, weight, and BMI.  Blood pressure.  Lipid and cholesterol levels. These may be checked every 5 years, or more frequently if you are over 62 years old.  Skin check.  Lung cancer screening. You may have this screening every year starting at age 45 if you have a 30-pack-year history of smoking and currently smoke or have quit within the past 15 years.  Fecal occult blood test (FOBT) of the stool. You may have this test every year starting at age 68.  Flexible sigmoidoscopy or colonoscopy. You may have a sigmoidoscopy every 5 years or a colonoscopy every 10 years starting at age 57.  Hepatitis C blood test.  Hepatitis B blood test.  Sexually transmitted disease (STD) testing.  Diabetes screening. This is done by checking your blood sugar (glucose) after you have not eaten for a while (fasting). You may have this done every 1-3 years.  Mammogram. This may be done every 1-2 years. Talk to your health care provider about when you should start having regular mammograms. This may depend on whether you have a family history of breast cancer.  BRCA-related cancer screening. This may be done if you have a family history of breast, ovarian, tubal, or peritoneal cancers.  Pelvic exam and Pap test. This may be done every 3 years starting at age 21. Starting at age 6, this may be done every 5 years if you have a Pap test in combination with an HPV test.  Bone density scan. This is done to screen for osteoporosis. You may have  this scan if you are at high risk for osteoporosis. Discuss your test results, treatment options, and if necessary, the need for more tests with your health care provider. Vaccines  Your health care provider may recommend certain vaccines, such as:  Influenza vaccine. This is recommended every year.  Tetanus, diphtheria,  and acellular pertussis (Tdap, Td) vaccine. You may need a Td booster every 10 years.  Zoster vaccine. You may need this after age 50.  Pneumococcal 13-valent conjugate (PCV13) vaccine. You may need this if you have certain conditions and were not previously vaccinated.  Pneumococcal polysaccharide (PPSV23) vaccine. You may need one or two doses if you smoke cigarettes or if you have certain conditions. Talk to your health care provider about which screenings and vaccines you need and how often you need them. This information is not intended to replace advice given to you by your health care provider. Make sure you discuss any questions you have with your health care provider. Document Released: 07/03/2015 Document Revised: 02/24/2016 Document Reviewed: 04/07/2015 Elsevier Interactive Patient Education  2017 [place] Prevention in the Home Falls can cause injuries. They can happen to people of all ages. There are many things you can do to make your home safe and to help prevent falls. What can I do on the outside of my home?  Regularly fix the edges of walkways and driveways and fix any cracks.  Remove anything that might make you trip as you walk through a door, such as a raised step or threshold.  Trim any bushes or trees on the path to your home.  Use bright outdoor lighting.  Clear any walking paths of anything that might make someone trip, such as rocks or tools.  Regularly check to see if handrails are loose or broken. Make sure that both sides of any steps have handrails.  Any raised decks and porches should have guardrails on the edges.  Have any leaves, snow, or ice cleared regularly.  Use sand or salt on walking paths during winter.  Clean up any spills in your garage right away. This includes oil or grease spills. What can I do in the bathroom?  Use night lights.  Install grab bars by the toilet and in the tub and shower. Do not use towel bars as grab  bars.  Use non-skid mats or decals in the tub or shower.  If you need to sit down in the shower, use a plastic, non-slip stool.  Keep the floor dry. Clean up any water that spills on the floor as soon as it happens.  Remove soap buildup in the tub or shower regularly.  Attach bath mats securely with double-sided non-slip rug tape.  Do not have throw rugs and other things on the floor that can make you trip. What can I do in the bedroom?  Use night lights.  Make sure that you have a light by your bed that is easy to reach.  Do not use any sheets or blankets that are too big for your bed. They should not hang down onto the floor.  Have a firm chair that has side arms. You can use this for support while you get dressed.  Do not have throw rugs and other things on the floor that can make you trip. What can I do in the kitchen?  Clean up any spills right away.  Avoid walking on wet floors.  Keep items that you use a lot in easy-to-reach places.  If you need to reach something above you, use a strong step stool that has a grab bar.  Keep electrical cords out of the way.  Do not use floor polish or wax that makes floors slippery. If you must use wax, use non-skid floor wax.  Do not have throw rugs and other things on the floor that can make you trip. What can I do with my stairs?  Do not leave any items on the stairs.  Make sure that there are handrails on both sides of the stairs and use them. Fix handrails that are broken or loose. Make sure that handrails are as long as the stairways.  Check any carpeting to make sure that it is firmly attached to the stairs. Fix any carpet that is loose or worn.  Avoid having throw rugs at the top or bottom of the stairs. If you do have throw rugs, attach them to the floor with carpet tape.  Make sure that you have a light switch at the top of the stairs and the bottom of the stairs. If you do not have them, ask someone to add them for  you. What else can I do to help prevent falls?  Wear shoes that:  Do not have high heels.  Have rubber bottoms.  Are comfortable and fit you well.  Are closed at the toe. Do not wear sandals.  If you use a stepladder:  Make sure that it is fully opened. Do not climb a closed stepladder.  Make sure that both sides of the stepladder are locked into place.  Ask someone to hold it for you, if possible.  Clearly mark and make sure that you can see:  Any grab bars or handrails.  First and last steps.  Where the edge of each step is.  Use tools that help you move around (mobility aids) if they are needed. These include:  Canes.  Walkers.  Scooters.  Crutches.  Turn on the lights when you go into a dark area. Replace any light bulbs as soon as they burn out.  Set up your furniture so you have a clear path. Avoid moving your furniture around.  If any of your floors are uneven, fix them.  If there are any pets around you, be aware of where they are.  Review your medicines with your doctor. Some medicines can make you feel dizzy. This can increase your chance of falling. Ask your doctor what other things that you can do to help prevent falls. This information is not intended to replace advice given to you by your health care provider. Make sure you discuss any questions you have with your health care provider. Document Released: 04/02/2009 Document Revised: 11/12/2015 Document Reviewed: 07/11/2014 Elsevier Interactive Patient Education  2017 [employer].

## 2018-03-05 NOTE — Progress Notes (Signed)
Subjective:   Victoria [name] is a 47 y.o. female who presents for Medicare Annual (Subsequent) preventive examination.  Review of Systems:   Cardiac Risk Factors include: sedentary lifestyle     Objective:     Vitals: BP 122/84   Pulse (!) 123   Resp 16   Ht 5\' 4"  (1.626 m)   SpO2 98%   BMI 24.37 kg/m   Body mass index is 24.37 kg/m.  Advanced Directives 03/05/2018 04/12/2017 01/16/2017 06/22/2016 08/19/2015 09/02/2014 08/29/2014  Does Patient Have a Medical Advance Directive? No No No No No - Yes  Type of Advance Directive [place] (No Data) Therapist, sports in Chart? - - - - - - No - copy requested  Would patient like information on creating a medical advance directive? - No - Patient declined Yes (MAU/Ambulatory/Procedural Areas - Information given) No - Patient declined Yes Higher education careers adviser given - -    Tobacco Social History   Tobacco Use  Smoking Status Never Smoker  Smokeless Tobacco Never Used     Counseling given: Not Answered   Clinical Intake:  Pre-visit preparation completed: No  Pain : No/denies pain Pain Score: 0-No pain     Nutritional Status: BMI of 19-24  Normal Diabetes: No           Past Medical History:  Diagnosis Date  . Anxiety   . Diabetes mellitus   . Dyslipidemia   . Fibroids 06/10/2014  . Huntington's disease ([place])   . Hypertension   . Menorrhagia with regular cycle 06/02/2014  . Mild cognitive impairment   . Obesity   . Perennial allergic rhinitis   . Sleep apnea    cannot tolerate CPAP.   History reviewed. No pertinent surgical history. Family History  Problem Relation Age of Onset  . Huntington's disease Father   . Diabetes Mother   . Hypertension Mother   . Hyperlipidemia Mother   . Huntington's disease Brother   . Alzheimer's disease Maternal Grandmother   . Cancer Unknown        family history   . Diabetes Unknown    family history   . Heart defect Unknown        family history    Social History   Socioeconomic History  . Marital status: Divorced    Spouse name: Not on file  . Number of children: 1  . Years of education: Not on file  . Highest education level: Not on file  Occupational History  . Occupation: disabled   Social Needs  . Financial resource strain: Not on file  . Food insecurity:    Worry: Not on file    Inability: Not on file  . Transportation needs:    Medical: Not on file    Non-medical: Not on file  Tobacco Use  . Smoking status: Never Smoker  . Smokeless tobacco: Never Used  Substance and Sexual Activity  . Alcohol use: No  . Drug use: No  . Sexual activity: Never    Birth control/protection: Injection  Lifestyle  . Physical activity:    Days per week: Not on file    Minutes per session: Not on file  . Stress: Not on file  Relationships  . Social connections:    Talks on phone: Not on file    Gets together: Not on file    Attends religious service: Not on file  Active member of club or organization: Not on file    Attends meetings of clubs or organizations: Not on file    Relationship status: Not on file  Other Topics Concern  . Not on file  Social History Narrative   Lives with mom   Drinks 1 cup of coffee a day    Outpatient Encounter Medications as of 03/05/2018  Medication Sig  . citalopram (CELEXA) 20 MG tablet TAKE ONE (1) TABLET BY MOUTH EVERY DAY  . Clobetasol Propionate (CLOBEX) 0.05 % shampoo Apply topically twice daily  . donepezil (ARICEPT) 10 MG tablet Take 10 mg by mouth at bedtime.  . Incontinence Supply Disposable (UNDERPADS) MISC 30 per month  . lovastatin (MEVACOR) 20 MG tablet TAKE ONE TABLET (20MG  TOTAL) BY MOUTH ATBEDTIME  . medroxyPROGESTERone (DEPO-PROVERA) 150 MG/ML injection Inject 1 mL (150 mg total) into the muscle every 3 (three) months.  . megestrol (MEGACE) 40 MG tablet TAKE ONE TABLET BY MOUTH DAILY ON DAYS OF SPOTTING. IF  SPOTTING CONTINUES ON ONCE DAILY MAY INCREASE TO TWICE A DAY  . memantine (NAMENDA) 10 MG tablet Take 1 tablet (10 mg total) by mouth 2 (two) times daily.  . QUEtiapine (SEROQUEL) 50 MG tablet Take 50 mg by mouth 3 (three) times daily.   . risperiDONE (RISPERDAL) 1 MG tablet Take 1 mg by mouth 2 (two) times daily.  Marland Kitchen STARCH-MALTO DEXTRIN (THICK-IT) POWD 4 tsp per 6 ounces of liquid.  Marland Kitchen UNABLE TO FIND Pullups  100/m  . UNABLE TO FIND Electric hospital bed x 1  DX: Huntington's disease ICD 10 G10  . [DISCONTINUED] QUEtiapine (SEROQUEL) 100 MG tablet Take 100 mg by mouth at bedtime.   No facility-administered encounter medications on file as of 03/05/2018.     Activities of Daily Living In your present state of health, do you have any difficulty performing the following activities: 03/05/2018  Hearing? Y  Vision? Y  Difficulty concentrating or making decisions? Y  Walking or climbing stairs? Y  Dressing or bathing? Y  Doing errands, shopping? Y  Preparing Food and eating ? Y  Using the Toilet? Y  In the past six months, have you accidently leaked urine? Y  Do you have problems with loss of bowel control? Y  Managing your Medications? Y  Managing your Finances? Y  Housekeeping or managing your Housekeeping? Y  Some recent data might be hidden    Patient Care Team: Fayrene Helper, MD as PCP - General [name], MD as Consulting Physician (Obstetrics and Gynecology) Danie Binder, MD as Consulting Physician (Gastroenterology) Phillips Odor, MD as Consulting Physician (Neurology)    Assessment:   This is a routine wellness examination for Victoria [name].  Exercise Activities and Dietary recommendations Current Exercise Habits: The patient does not participate in regular exercise at present, Exercise limited by: neurologic condition(s)  Goals   None     Fall Risk Fall Risk  03/05/2018 11/15/2017 01/16/2017 12/10/2015 07/16/2013  Falls in the past year? No No Yes No No    Number falls in past yr: - - 2 or more - -  Injury with Fall? - - No - -  Risk Factor Category  - - High Fall Risk - -  Risk for fall due to : - - History of fall(s);Impaired balance/gait;Impaired mobility;Mental status change - -  Follow up - - Falls evaluation completed;Education provided;Falls prevention discussed - -   Is the patient's home free of loose throw rugs in walkways, pet  beds, electrical cords, etc?   yes      Grab bars in the bathroom? yes      Handrails on the stairs?   yes      Adequate lighting?   yes  Timed Get Up and Go performed:   Depression Screen PHQ 2/9 Scores 03/05/2018 11/15/2017 01/16/2017 07/16/2013  PHQ - 2 Score 6 3 - 0  PHQ- 9 Score 16 13 - 4  Exception Documentation - - Medical reason -     Cognitive Function MMSE - Mini Mental State Exam 01/16/2017  Not completed: Unable to complete     6CIT Screen 03/05/2018  What Year? 4 points  What month? 3 points  What time? 3 points  Count back from 20 4 points  Months in reverse 4 points  Repeat phrase 10 points  Total Score 28    Immunization History  Administered Date(s) Administered  . Influenza Split 04/27/2011, 05/01/2012  . Influenza Whole 03/22/2007, 04/21/2009, 03/04/2010  . Influenza,inj,Quad PF,6+ Mos 03/19/2013, 05/08/2014, 04/14/2015, 05/04/2016, 07/03/2017  . Pneumococcal Conjugate-13 08/26/2014  . Pneumococcal Polysaccharide-23 04/21/2009  . Td 01/17/2007  . Tdap 08/25/2011    Qualifies for Shingles Vaccine? n/a  Screening Tests Health Maintenance  Topic Date Due  . OPHTHALMOLOGY EXAM  12/31/2014  . INFLUENZA VACCINE  01/18/2018  . URINE MICROALBUMIN  10/05/2018 (Originally 05/07/2015)  . PAP SMEAR  01/10/2019 (Originally 04/26/2014)  . HEMOGLOBIN A1C  12/31/2019 (Originally 03/03/2017)  . FOOT EXAM  01/08/2020 (Originally 03/21/2016)  . TETANUS/TDAP  08/24/2021  . PNEUMOCOCCAL POLYSACCHARIDE VACCINE AGE 69-64 HIGH RISK  Completed  . HIV Screening  Completed    Cancer  Screenings: Lung: Low Dose CT Chest recommended if Age 63-80 years, 30 pack-year currently smoking OR have quit w/in 15years. Patient does not qualify. Breast:  Up to date on Mammogram? No   Up to date of Bone Density/Dexa? No n/a Colorectal: not able to bear weight   Additional Screenings:  Hepatitis C Screening:      Plan:     I have personally reviewed and noted the following in the patient's chart:   . Medical and social history . Use of alcohol, tobacco or illicit drugs  . Current medications and supplements . Functional ability and status . Nutritional status . Physical activity . Advanced directives . List of other physicians . Hospitalizations, surgeries, and ER visits in previous 12 months . Vitals . Screenings to include cognitive, depression, and falls . Referrals and appointments  In addition, I have reviewed and discussed with patient certain preventive protocols, quality metrics, and best practice recommendations. A written personalized care plan for preventive services as well as general preventive health recommendations were provided to patient.     Kate Sable, LPN, LPN  [id number]

## 2018-03-07 ENCOUNTER — Telehealth: Payer: Self-pay | Admitting: Family Medicine

## 2018-03-07 NOTE — Telephone Encounter (Signed)
Please let patient's Mother know that a call was mad to [facility] center and if [place] ids to ghet Respite care there, she will need to pay out of pocket.   I have heard that [facility]( old name) will do Respite care   that may be covered , so please advise her to check on this , and prepare an FL2 form so I can sign it

## 2018-03-14 ENCOUNTER — Other Ambulatory Visit: Payer: Self-pay | Admitting: Family Medicine

## 2018-03-22 ENCOUNTER — Other Ambulatory Visit: Payer: Self-pay

## 2018-03-22 ENCOUNTER — Encounter: Payer: Self-pay | Admitting: *Deleted

## 2018-03-22 ENCOUNTER — Telehealth: Payer: Self-pay | Admitting: Family Medicine

## 2018-03-22 ENCOUNTER — Ambulatory Visit (INDEPENDENT_AMBULATORY_CARE_PROVIDER_SITE_OTHER): Payer: Medicare Other | Admitting: *Deleted

## 2018-03-22 ENCOUNTER — Other Ambulatory Visit: Payer: Self-pay | Admitting: Women's Health

## 2018-03-22 ENCOUNTER — Other Ambulatory Visit: Payer: Self-pay | Admitting: *Deleted

## 2018-03-22 DIAGNOSIS — Z3042 Encounter for surveillance of injectable contraceptive: Secondary | ICD-10-CM

## 2018-03-22 MED ORDER — UNABLE TO FIND: 11 refills | Status: DC

## 2018-03-22 MED ORDER — UNABLE TO FIND: 0 refills | Status: DC

## 2018-03-22 MED ORDER — MEDROXYPROGESTERONE ACETATE 150 MG/ML IM SUSP
150.0000 mg | Freq: Once | INTRAMUSCULAR | Status: AC
  Administered 2018-03-22: 150 mg via INTRAMUSCULAR

## 2018-03-22 NOTE — Telephone Encounter (Signed)
Pts family is calling to inquire on the status of the gel mattress and gel cushion for the wheelchair. Please call back and give an update

## 2018-03-22 NOTE — Progress Notes (Signed)
Pt here for Depo. Pt is not sexually active so no pregnancy test was done.  Pt received shot in left deltoid. Pt tolerated shot well. Return in 12 weeks for next shot. [place]

## 2018-03-28 ENCOUNTER — Other Ambulatory Visit: Payer: Self-pay

## 2018-03-28 MED ORDER — UNABLE TO FIND: 11 refills | Status: DC

## 2018-03-28 MED ORDER — UNABLE TO FIND: 0 refills | Status: DC

## 2018-04-03 NOTE — Telephone Encounter (Signed)
I have sent this in and the office note. If you get time can you let them know and if I need to do anything else for this to be processed at apothecary?

## 2018-04-03 NOTE — Telephone Encounter (Signed)
Spoke with Velma (caregiver). Advised her info and office notes have been sent to [facility] and may take a couple of days to process. If they haven't heard anything in a couple of days to call to get update. They may call us anytime and we will do all we can to assist them. Verbal understanding given.

## 2018-04-25 ENCOUNTER — Other Ambulatory Visit: Payer: Medicare Other | Admitting: Obstetrics and Gynecology

## 2018-05-02 NOTE — Telephone Encounter (Signed)
Attempted to call patient's Mother, no answer on either phone, and no voicemail. Will try again later

## 2018-05-07 DIAGNOSIS — R260 Ataxic gait: Secondary | ICD-10-CM | POA: Diagnosis not present

## 2018-05-07 DIAGNOSIS — G4721 Circadian rhythm sleep disorder, delayed sleep phase type: Secondary | ICD-10-CM | POA: Diagnosis not present

## 2018-05-07 DIAGNOSIS — Z79899 Other long term (current) drug therapy: Secondary | ICD-10-CM | POA: Diagnosis not present

## 2018-05-07 DIAGNOSIS — G10 Huntington's disease: Secondary | ICD-10-CM | POA: Diagnosis not present

## 2018-05-09 ENCOUNTER — Encounter: Payer: Self-pay | Admitting: Obstetrics and Gynecology

## 2018-05-09 ENCOUNTER — Ambulatory Visit (INDEPENDENT_AMBULATORY_CARE_PROVIDER_SITE_OTHER): Payer: Medicare Other | Admitting: Obstetrics and Gynecology

## 2018-05-09 ENCOUNTER — Encounter (INDEPENDENT_AMBULATORY_CARE_PROVIDER_SITE_OTHER): Payer: Self-pay

## 2018-05-09 DIAGNOSIS — Z3042 Encounter for surveillance of injectable contraceptive: Secondary | ICD-10-CM | POA: Diagnosis not present

## 2018-05-09 MED ORDER — MEDROXYPROGESTERONE ACETATE 150 MG/ML IM SUSP: 150.0000 mg | INTRAMUSCULAR | 3 refills | Status: DC

## 2018-05-09 NOTE — Progress Notes (Signed)
Patient ID: Victoria [name], female   DOB: 1970-12-24, 47 y.o.   MRN: [id number]   Assessment:  Well woman exam without gyn exam.  Plan:  1. pap smear NOT done, 2. return annually or prn 3    Depo Provera Subjective:  Victoria [name] is a 47 y.o. female G1P1001 who presents for annual exam. No LMP recorded. Patient has had an injection. The patient has complaints today of. Tieshia is non-verbal, she is accompanied by family members. She is not sexually active and receives Depo to cease periods. She has a son who is 79, born in [place]. huntington's disease progressed afterwards. Had breast exam with  Dr.Simpson, she doesn't receive mammograms. Markiah's father side had Huntington's disease  The following portions of the patient's history were reviewed and updated as appropriate: allergies, current medications, past family history, past medical history, past social history, past surgical history and problem list. Past Medical History:  Diagnosis Date  . Anxiety   . Diabetes mellitus   . Dyslipidemia   . Fibroids 06/10/2014  . Huntington's disease ([place])   . Hypertension   . Menorrhagia with regular cycle 06/02/2014  . Mild cognitive impairment   . Obesity   . Perennial allergic rhinitis   . Sleep apnea    cannot tolerate CPAP.    No past surgical history on file.   Current Outpatient Medications:  .  citalopram (CELEXA) 20 MG tablet, TAKE ONE (1) TABLET BY MOUTH EVERY DAY, Disp: 90 tablet, Rfl: 2 .  Clobetasol Propionate (CLOBEX) 0.05 % shampoo, Apply topically twice daily, Disp: 118 mL, Rfl: 4 .  donepezil (ARICEPT) 10 MG tablet, Take 10 mg by mouth at bedtime., Disp: , Rfl:  .  Incontinence Supply Disposable (UNDERPADS) MISC, 30 per month, Disp: 30 each, Rfl: 11 .  lovastatin (MEVACOR) 20 MG tablet, TAKE ONE TABLET (20MG  TOTAL) BY MOUTH ATBEDTIME, Disp: 90 tablet, Rfl: 0 .  medroxyPROGESTERone (DEPO-PROVERA) 150 MG/ML injection, Inject 1 mL (150 mg total) into the muscle every  3 (three) months., Disp: 1 mL, Rfl: 3 .  megestrol (MEGACE) 40 MG tablet, TAKE ONE TABLET BY MOUTH DAILY ON DAYS OF SPOTTING. IF SPOTTING CONTINUES ON ONCE DAILY MAY INCREASE TO TWICE A DAY, Disp: 60 tablet, Rfl: 1 .  memantine (NAMENDA) 10 MG tablet, Take 1 tablet (10 mg total) by mouth 2 (two) times daily., Disp: 180 tablet, Rfl: 1 .  QUEtiapine (SEROQUEL) 50 MG tablet, Take 50 mg by mouth 3 (three) times daily. , Disp: , Rfl:  .  risperiDONE (RISPERDAL) 1 MG tablet, Take 1 mg by mouth 2 (two) times daily., Disp: , Rfl:  .  STARCH-MALTO DEXTRIN (THICK-IT) POWD, 4 tsp per 6 ounces of liquid., Disp: 1 Can, Rfl: 11 .  UNABLE TO FIND, Gel pad cushion for wheelchair x 1   Dx Huntingtons disease, Disp: 100 each, Rfl: 11 .  UNABLE TO FIND, Gel Pad overlay for electric hospital bed x 1  DX: Huntington's disease ICD 10 G10, Disp: 1 each, Rfl: 0  Review of Systems Constitutional: negative Gastrointestinal: negative Genitourinary: normal uses diapers.   Objective:  There were no vitals taken for this visit.   BMI: There is no height or weight on file to calculate BMI.  General Appearance: Alert, appropriate appearance for age. No acute distress, reduced flexibility in fingers HEENT: Grossly normal Neck / Thyroid:  Cardiovascular: RRR; normal S1, S2, no murmur Lungs: CTA bilaterally Back: No CVAT Breast Exam: not examined Gastrointestinal: Soft, non-tender, no  masses or organomegaly Pelvic Exam: Not examined. Discussed with mother and denied physical exam Lymphatic Exam: Non-palpable nodes in neck, clavicular, axillary, or inguinal regions  Skin: no rash or abnormalities Neurologic: Non-verbal, due to Huntington'. Left arm raises to 90 degree. Left ROM greater than right  Psychiatric: Alert and oriented, appropriate affect.  Urinalysis:Not done  By signing my name below, I, Samul Dada, attest that this documentation has been prepared under the direction and in the presence of [name], MD. Electronically Signed: [place]. 05/09/18. 1:44 PM.  I personally performed the services described in this documentation, which was SCRIBED in my presence. The recorded information has been reviewed and considered accurate. It has been edited as necessary during review. [name], MD

## 2018-05-23 ENCOUNTER — Encounter: Payer: Medicare Other | Admitting: Family Medicine

## 2018-06-18 ENCOUNTER — Ambulatory Visit: Payer: Medicare Other

## 2018-06-19 ENCOUNTER — Other Ambulatory Visit: Payer: Self-pay | Admitting: Family Medicine

## 2018-06-22 ENCOUNTER — Other Ambulatory Visit: Payer: Self-pay

## 2018-06-22 ENCOUNTER — Ambulatory Visit (INDEPENDENT_AMBULATORY_CARE_PROVIDER_SITE_OTHER): Payer: Medicare Other | Admitting: *Deleted

## 2018-06-22 ENCOUNTER — Encounter: Payer: Self-pay | Admitting: *Deleted

## 2018-06-22 DIAGNOSIS — Z3042 Encounter for surveillance of injectable contraceptive: Secondary | ICD-10-CM

## 2018-06-22 MED ORDER — MEDROXYPROGESTERONE ACETATE 150 MG/ML IM SUSP
150.0000 mg | Freq: Once | INTRAMUSCULAR | Status: AC
  Administered 2018-06-22: 150 mg via INTRAMUSCULAR

## 2018-06-22 NOTE — Progress Notes (Signed)
Pt given DepoProvera 150mg IM right deltoid without complications. Advised to return in 12 weeks for next injection. 

## 2018-06-26 ENCOUNTER — Ambulatory Visit (INDEPENDENT_AMBULATORY_CARE_PROVIDER_SITE_OTHER): Payer: Medicare Other | Admitting: Family Medicine

## 2018-06-26 VITALS — BP 118/80 | HR 154 | Resp 12 | Ht 64.0 in

## 2018-06-26 DIAGNOSIS — E785 Hyperlipidemia, unspecified: Secondary | ICD-10-CM | POA: Diagnosis not present

## 2018-06-26 DIAGNOSIS — R634 Abnormal weight loss: Secondary | ICD-10-CM

## 2018-06-26 DIAGNOSIS — R000 Tachycardia, unspecified: Secondary | ICD-10-CM | POA: Diagnosis not present

## 2018-06-26 DIAGNOSIS — G10 Huntington's disease: Secondary | ICD-10-CM

## 2018-06-26 DIAGNOSIS — E559 Vitamin D deficiency, unspecified: Secondary | ICD-10-CM

## 2018-06-26 DIAGNOSIS — N920 Excessive and frequent menstruation with regular cycle: Secondary | ICD-10-CM

## 2018-06-26 MED ORDER — METOPROLOL SUCCINATE ER 25 MG PO TB24: ORAL_TABLET | ORAL | 5 refills | Status: DC

## 2018-06-26 NOTE — Patient Instructions (Addendum)
F/U in  4  months, call if you need me sooner  Labs today CBC, cmp and EGFR, TSH and vit D today  New for heart rate is metoprolol 12.5 mg daily, this will be sent after I speak with pharmacy today  You are being referred to cardiology re rapid heart rate   We will reach out to hospice rerespit care and let you know

## 2018-06-27 ENCOUNTER — Encounter: Payer: Self-pay | Admitting: Cardiology

## 2018-06-27 ENCOUNTER — Other Ambulatory Visit: Payer: Self-pay | Admitting: Family Medicine

## 2018-06-27 ENCOUNTER — Encounter: Payer: Self-pay | Admitting: Family Medicine

## 2018-06-27 DIAGNOSIS — E559 Vitamin D deficiency, unspecified: Secondary | ICD-10-CM | POA: Diagnosis not present

## 2018-06-27 DIAGNOSIS — R000 Tachycardia, unspecified: Secondary | ICD-10-CM | POA: Insufficient documentation

## 2018-06-27 DIAGNOSIS — R4701 Aphasia: Secondary | ICD-10-CM | POA: Diagnosis not present

## 2018-06-27 DIAGNOSIS — E119 Type 2 diabetes mellitus without complications: Secondary | ICD-10-CM | POA: Diagnosis not present

## 2018-06-27 DIAGNOSIS — E785 Hyperlipidemia, unspecified: Secondary | ICD-10-CM | POA: Diagnosis not present

## 2018-06-27 DIAGNOSIS — I10 Essential (primary) hypertension: Secondary | ICD-10-CM | POA: Diagnosis not present

## 2018-06-27 DIAGNOSIS — G10 Huntington's disease: Secondary | ICD-10-CM | POA: Diagnosis not present

## 2018-06-27 DIAGNOSIS — R1310 Dysphagia, unspecified: Secondary | ICD-10-CM | POA: Diagnosis not present

## 2018-06-27 LAB — COMPLETE METABOLIC PANEL WITH GFR
AG Ratio: 1.5 (calc) (ref 1.0–2.5)
ALBUMIN MSPROF: 4.1 g/dL (ref 3.6–5.1)
ALKALINE PHOSPHATASE (APISO): 68 U/L (ref 33–115)
ALT: 6 U/L (ref 6–29)
AST: 9 U/L — ABNORMAL LOW (ref 10–35)
BILIRUBIN TOTAL: 0.3 mg/dL (ref 0.2–1.2)
BUN: 12 mg/dL (ref 7–25)
CHLORIDE: 106 mmol/L (ref 98–110)
CO2: 25 mmol/L (ref 20–32)
Calcium: 10.4 mg/dL — ABNORMAL HIGH (ref 8.6–10.2)
Creat: 0.89 mg/dL (ref 0.50–1.10)
GFR, Est African American: 89 mL/min/{1.73_m2} (ref >=60)
GFR, Est Non African American: 77 mL/min/{1.73_m2} (ref >=60)
GLUCOSE: 115 mg/dL (ref 65–139)
Globulin: 2.7 g/dL (calc) (ref 1.9–3.7)
Potassium: 3.9 mmol/L (ref 3.5–5.3)
Sodium: 138 mmol/L (ref 135–146)
Total Protein: 6.8 g/dL (ref 6.1–8.1)

## 2018-06-27 LAB — CBC
HCT: 43.7 % (ref 35.0–45.0)
Hemoglobin: 14.3 g/dL (ref 11.7–15.5)
MCH: 28 pg (ref 27.0–33.0)
MCHC: 32.7 g/dL (ref 32.0–36.0)
MCV: 85.7 fL (ref 80.0–100.0)
MPV: 11.6 fL (ref 7.5–12.5)
PLATELETS: 247 10*3/uL (ref 140–400)
RBC: 5.1 10*6/uL (ref 3.80–5.10)
RDW: 13.1 % (ref 11.0–15.0)
WBC: 4.7 10*3/uL (ref 3.8–10.8)

## 2018-06-27 LAB — TSH: TSH: 2.39 m[IU]/L

## 2018-06-27 LAB — VITAMIN D 25 HYDROXY (VIT D DEFICIENCY, FRACTURES): VIT D 25 HYDROXY: 25 ng/mL — AB (ref 30–100)

## 2018-06-27 MED ORDER — ERGOCALCIFEROL 1.25 MG (50000 UT) PO CAPS: 50000.0000 [IU] | ORAL_CAPSULE | ORAL | 3 refills | Status: DC

## 2018-06-27 NOTE — Progress Notes (Signed)
   CORBIN HOTT     MRN: [id number]      DOB: 05/01/1971   HPI Ms. Brazee is here for follow up and re-evaluation of chronic medical conditions, medication management and review of any available recent lab and radiology data.  Preventive health is updated, specifically  immunization.   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 Her disease is progressive and incurable ,there is clinical evidence of progression over time,  and as a result she does qualify for hospice care and she will be refer rd, Likely in home hospice is best option with access to services provided/ offered including Respite care    ROS History from caregivers Denies recent fever or chills. Denies sinus pressure, nasal congestion,  Denies chest congestion, productive cough or wheezing. Denies  leg swelling Denies abdominal pain, nausea, vomiting,diarrhea or constipation.   Denies dysuria, frequency, hesitancy  Progressive  limitation in mobility.  Denies skin, notes hyperpigmented lesion on right ankle  PE  BP 118/80 (BP Location: Left Arm, Patient Position: Sitting, Cuff Size: Normal)   Pulse (!) 154   Resp 12   Ht 5\' 4"  (1.626 m)   SpO2 98%   BMI 24.03 kg/m   Patient alert  And tachycardic  HEENT: No facial asymmetry, EOMI,  .  Neck decreased ROM no JVD, no mass.  Chest: Clear to auscultation bilaterally.  CVS: S1, S2 no murmurs, no S3.Tachycardia ABD: Soft non tender.   Ext: No edema  WU:[id number] ROM spine, shoulders, hips and knees.  Skin: Intact, hyperpigmented annular lesion on right ankle.  Psych: Poor eye contact, flat affect. .  GNF:[id number] power and tone in all 4 extremities   Assessment &  Plan  HUNTINGTON'S DISEASE Pt with progressive neurologic disease , Huntington's diagnosed over 15 years ago. Evidence of deterioration in health over time, aphasic, has dysphagia and is immobile independently Oral intake continues to deteriorate. Referral to Hospice indicated and Mother and Aunt who are her caregivers are desirous. They have been and will continue to be committed to best possible care for [facility], and realize that this is supportive and not curative. I have discussed this  with Mother in the past   Tachycardia Blood pressure and respiratory rate are within normal and patient in no distress. Noted to be tachycardic 1 year ago also. TSH checked and is normal, she is not anemic. Low dose metoprollo 12.5 mg started and cardiology eval requested  Weight loss, abnormal Poor oral intake due to advancing neurologic / end stage disease  Heavy menses Controlled with 12 weekly Depo Provera  Falls Home safety reviewed, pt incapable of independent ambulation

## 2018-06-27 NOTE — Assessment & Plan Note (Signed)
Home safety reviewed, pt incapable of independent ambulation

## 2018-06-27 NOTE — Assessment & Plan Note (Signed)
Blood pressure and respiratory rate are within normal and patient in no distress. Noted to be tachycardic 1 year ago also. TSH checked and is normal, she is not anemic. Low dose metoprollo 12.5 mg started and cardiology eval requested

## 2018-06-27 NOTE — Assessment & Plan Note (Signed)
Pt with progressive neurologic disease , Huntington's diagnosed over 15 years ago. Evidence of deterioration in health over time, aphasic, has dysphagia and is immobile independently Oral intake continues to deteriorate. Referral to Hospice indicated and Mother and Aunt who are her caregivers are desirous. They have been and will continue to be committed to best possible care for [name], and realize that this is supportive and not curative. I have discussed this  with Mother in the past

## 2018-06-27 NOTE — Assessment & Plan Note (Signed)
Controlled with 12 weekly Depo Provera

## 2018-06-27 NOTE — Assessment & Plan Note (Signed)
Poor oral intake due to advancing neurologic / end stage disease

## 2018-06-28 ENCOUNTER — Telehealth: Payer: Self-pay

## 2018-06-28 NOTE — Progress Notes (Signed)
Lab result letter sent to patient

## 2018-06-28 NOTE — Telephone Encounter (Signed)
[name] called and wanted to let you know that Victoria [name] was evaluated and admitted to hospice services yesterday (and therefore will qualify for respite care)

## 2018-07-02 ENCOUNTER — Telehealth: Payer: Self-pay

## 2018-07-02 DIAGNOSIS — E119 Type 2 diabetes mellitus without complications: Secondary | ICD-10-CM | POA: Diagnosis not present

## 2018-07-02 DIAGNOSIS — I10 Essential (primary) hypertension: Secondary | ICD-10-CM | POA: Diagnosis not present

## 2018-07-02 DIAGNOSIS — E785 Hyperlipidemia, unspecified: Secondary | ICD-10-CM | POA: Diagnosis not present

## 2018-07-02 DIAGNOSIS — G10 Huntington's disease: Secondary | ICD-10-CM | POA: Diagnosis not present

## 2018-07-02 DIAGNOSIS — R4701 Aphasia: Secondary | ICD-10-CM | POA: Diagnosis not present

## 2018-07-02 DIAGNOSIS — E559 Vitamin D deficiency, unspecified: Secondary | ICD-10-CM | POA: Diagnosis not present

## 2018-07-02 NOTE — Telephone Encounter (Signed)
pls give verbal oK for both medications

## 2018-07-02 NOTE — Telephone Encounter (Signed)
Called and spoke with Victoria [name], she stated that the hospice standing orders have just not been sent over to our office yet. She will go with the Hospice standing order, which is Ativan 0.5mg  as needed

## 2018-07-02 NOTE — Telephone Encounter (Signed)
Tabitha the nurse with hospice called and states that during her visit with Kimmora today her Mother states that at night Alanny is becoming more agitated, kicking and thrashing in the bed. Tabitha observed several bruises and a "knot" on patient's foot, from kicking bed rails, per family.  Called today to request maybe an order for liquid Ativan to be given at night as needed. Also reported that [place] had not had a bowel movement in 4-5 days now. Wanted to also request an order for Senakot to be used as needed for constipation. Family requested script to be sent to [facility]. Please advise   Tabitha Landscape architect Nurse) [phone]

## 2018-07-06 DIAGNOSIS — E559 Vitamin D deficiency, unspecified: Secondary | ICD-10-CM | POA: Diagnosis not present

## 2018-07-06 DIAGNOSIS — R4701 Aphasia: Secondary | ICD-10-CM | POA: Diagnosis not present

## 2018-07-06 DIAGNOSIS — G10 Huntington's disease: Secondary | ICD-10-CM | POA: Diagnosis not present

## 2018-07-06 DIAGNOSIS — E119 Type 2 diabetes mellitus without complications: Secondary | ICD-10-CM | POA: Diagnosis not present

## 2018-07-06 DIAGNOSIS — I10 Essential (primary) hypertension: Secondary | ICD-10-CM | POA: Diagnosis not present

## 2018-07-06 DIAGNOSIS — E785 Hyperlipidemia, unspecified: Secondary | ICD-10-CM | POA: Diagnosis not present

## 2018-07-13 ENCOUNTER — Telehealth: Payer: Self-pay | Admitting: Family Medicine

## 2018-07-13 DIAGNOSIS — I10 Essential (primary) hypertension: Secondary | ICD-10-CM | POA: Diagnosis not present

## 2018-07-13 DIAGNOSIS — R4701 Aphasia: Secondary | ICD-10-CM | POA: Diagnosis not present

## 2018-07-13 DIAGNOSIS — E559 Vitamin D deficiency, unspecified: Secondary | ICD-10-CM | POA: Diagnosis not present

## 2018-07-13 DIAGNOSIS — E785 Hyperlipidemia, unspecified: Secondary | ICD-10-CM | POA: Diagnosis not present

## 2018-07-13 DIAGNOSIS — G10 Huntington's disease: Secondary | ICD-10-CM | POA: Diagnosis not present

## 2018-07-13 DIAGNOSIS — E119 Type 2 diabetes mellitus without complications: Secondary | ICD-10-CM | POA: Diagnosis not present

## 2018-07-13 NOTE — Telephone Encounter (Signed)
Juliann Pulse called back wanting verbal OK to send medication refills that Hospice would pay for to [facility]. OK to do that given

## 2018-07-13 NOTE — Telephone Encounter (Signed)
Kathy LVM that she would like a call back regarding pt.

## 2018-07-13 NOTE — Telephone Encounter (Signed)
Called Kathy back, no answer, left a message

## 2018-07-17 DIAGNOSIS — E785 Hyperlipidemia, unspecified: Secondary | ICD-10-CM | POA: Diagnosis not present

## 2018-07-17 DIAGNOSIS — E119 Type 2 diabetes mellitus without complications: Secondary | ICD-10-CM | POA: Diagnosis not present

## 2018-07-17 DIAGNOSIS — G10 Huntington's disease: Secondary | ICD-10-CM | POA: Diagnosis not present

## 2018-07-17 DIAGNOSIS — E559 Vitamin D deficiency, unspecified: Secondary | ICD-10-CM | POA: Diagnosis not present

## 2018-07-17 DIAGNOSIS — I10 Essential (primary) hypertension: Secondary | ICD-10-CM | POA: Diagnosis not present

## 2018-07-17 DIAGNOSIS — R4701 Aphasia: Secondary | ICD-10-CM | POA: Diagnosis not present

## 2018-07-21 DIAGNOSIS — R4701 Aphasia: Secondary | ICD-10-CM | POA: Diagnosis not present

## 2018-07-21 DIAGNOSIS — E559 Vitamin D deficiency, unspecified: Secondary | ICD-10-CM | POA: Diagnosis not present

## 2018-07-21 DIAGNOSIS — E785 Hyperlipidemia, unspecified: Secondary | ICD-10-CM | POA: Diagnosis not present

## 2018-07-21 DIAGNOSIS — I10 Essential (primary) hypertension: Secondary | ICD-10-CM | POA: Diagnosis not present

## 2018-07-21 DIAGNOSIS — R1310 Dysphagia, unspecified: Secondary | ICD-10-CM | POA: Diagnosis not present

## 2018-07-21 DIAGNOSIS — E119 Type 2 diabetes mellitus without complications: Secondary | ICD-10-CM | POA: Diagnosis not present

## 2018-07-21 DIAGNOSIS — G10 Huntington's disease: Secondary | ICD-10-CM | POA: Diagnosis not present

## 2018-07-23 DIAGNOSIS — E119 Type 2 diabetes mellitus without complications: Secondary | ICD-10-CM | POA: Diagnosis not present

## 2018-07-23 DIAGNOSIS — E785 Hyperlipidemia, unspecified: Secondary | ICD-10-CM | POA: Diagnosis not present

## 2018-07-23 DIAGNOSIS — G10 Huntington's disease: Secondary | ICD-10-CM | POA: Diagnosis not present

## 2018-07-23 DIAGNOSIS — E559 Vitamin D deficiency, unspecified: Secondary | ICD-10-CM | POA: Diagnosis not present

## 2018-07-23 DIAGNOSIS — I10 Essential (primary) hypertension: Secondary | ICD-10-CM | POA: Diagnosis not present

## 2018-07-23 DIAGNOSIS — R4701 Aphasia: Secondary | ICD-10-CM | POA: Diagnosis not present

## 2018-07-24 DIAGNOSIS — R4701 Aphasia: Secondary | ICD-10-CM | POA: Diagnosis not present

## 2018-07-24 DIAGNOSIS — E559 Vitamin D deficiency, unspecified: Secondary | ICD-10-CM | POA: Diagnosis not present

## 2018-07-24 DIAGNOSIS — E119 Type 2 diabetes mellitus without complications: Secondary | ICD-10-CM | POA: Diagnosis not present

## 2018-07-24 DIAGNOSIS — I10 Essential (primary) hypertension: Secondary | ICD-10-CM | POA: Diagnosis not present

## 2018-07-24 DIAGNOSIS — G10 Huntington's disease: Secondary | ICD-10-CM | POA: Diagnosis not present

## 2018-07-24 DIAGNOSIS — E785 Hyperlipidemia, unspecified: Secondary | ICD-10-CM | POA: Diagnosis not present

## 2018-07-26 DIAGNOSIS — R4701 Aphasia: Secondary | ICD-10-CM | POA: Diagnosis not present

## 2018-07-26 DIAGNOSIS — E785 Hyperlipidemia, unspecified: Secondary | ICD-10-CM | POA: Diagnosis not present

## 2018-07-26 DIAGNOSIS — E559 Vitamin D deficiency, unspecified: Secondary | ICD-10-CM | POA: Diagnosis not present

## 2018-07-26 DIAGNOSIS — I10 Essential (primary) hypertension: Secondary | ICD-10-CM | POA: Diagnosis not present

## 2018-07-26 DIAGNOSIS — G10 Huntington's disease: Secondary | ICD-10-CM | POA: Diagnosis not present

## 2018-07-26 DIAGNOSIS — E119 Type 2 diabetes mellitus without complications: Secondary | ICD-10-CM | POA: Diagnosis not present

## 2018-07-30 ENCOUNTER — Other Ambulatory Visit: Payer: Self-pay | Admitting: Family Medicine

## 2018-08-01 DIAGNOSIS — G10 Huntington's disease: Secondary | ICD-10-CM | POA: Diagnosis not present

## 2018-08-01 DIAGNOSIS — E559 Vitamin D deficiency, unspecified: Secondary | ICD-10-CM | POA: Diagnosis not present

## 2018-08-01 DIAGNOSIS — I10 Essential (primary) hypertension: Secondary | ICD-10-CM | POA: Diagnosis not present

## 2018-08-01 DIAGNOSIS — E119 Type 2 diabetes mellitus without complications: Secondary | ICD-10-CM | POA: Diagnosis not present

## 2018-08-01 DIAGNOSIS — R4701 Aphasia: Secondary | ICD-10-CM | POA: Diagnosis not present

## 2018-08-01 DIAGNOSIS — E785 Hyperlipidemia, unspecified: Secondary | ICD-10-CM | POA: Diagnosis not present

## 2018-08-03 DIAGNOSIS — E559 Vitamin D deficiency, unspecified: Secondary | ICD-10-CM | POA: Diagnosis not present

## 2018-08-03 DIAGNOSIS — E785 Hyperlipidemia, unspecified: Secondary | ICD-10-CM | POA: Diagnosis not present

## 2018-08-03 DIAGNOSIS — R4701 Aphasia: Secondary | ICD-10-CM | POA: Diagnosis not present

## 2018-08-03 DIAGNOSIS — E119 Type 2 diabetes mellitus without complications: Secondary | ICD-10-CM | POA: Diagnosis not present

## 2018-08-03 DIAGNOSIS — I10 Essential (primary) hypertension: Secondary | ICD-10-CM | POA: Diagnosis not present

## 2018-08-03 DIAGNOSIS — G10 Huntington's disease: Secondary | ICD-10-CM | POA: Diagnosis not present

## 2018-08-06 DIAGNOSIS — R4701 Aphasia: Secondary | ICD-10-CM | POA: Diagnosis not present

## 2018-08-06 DIAGNOSIS — G10 Huntington's disease: Secondary | ICD-10-CM | POA: Diagnosis not present

## 2018-08-06 DIAGNOSIS — I10 Essential (primary) hypertension: Secondary | ICD-10-CM | POA: Diagnosis not present

## 2018-08-06 DIAGNOSIS — E119 Type 2 diabetes mellitus without complications: Secondary | ICD-10-CM | POA: Diagnosis not present

## 2018-08-06 DIAGNOSIS — E559 Vitamin D deficiency, unspecified: Secondary | ICD-10-CM | POA: Diagnosis not present

## 2018-08-06 DIAGNOSIS — E785 Hyperlipidemia, unspecified: Secondary | ICD-10-CM | POA: Diagnosis not present

## 2018-08-07 DIAGNOSIS — R4701 Aphasia: Secondary | ICD-10-CM | POA: Diagnosis not present

## 2018-08-07 DIAGNOSIS — G10 Huntington's disease: Secondary | ICD-10-CM | POA: Diagnosis not present

## 2018-08-07 DIAGNOSIS — I10 Essential (primary) hypertension: Secondary | ICD-10-CM | POA: Diagnosis not present

## 2018-08-07 DIAGNOSIS — E119 Type 2 diabetes mellitus without complications: Secondary | ICD-10-CM | POA: Diagnosis not present

## 2018-08-07 DIAGNOSIS — E785 Hyperlipidemia, unspecified: Secondary | ICD-10-CM | POA: Diagnosis not present

## 2018-08-07 DIAGNOSIS — E559 Vitamin D deficiency, unspecified: Secondary | ICD-10-CM | POA: Diagnosis not present

## 2018-08-08 DIAGNOSIS — E785 Hyperlipidemia, unspecified: Secondary | ICD-10-CM | POA: Diagnosis not present

## 2018-08-08 DIAGNOSIS — R4701 Aphasia: Secondary | ICD-10-CM | POA: Diagnosis not present

## 2018-08-08 DIAGNOSIS — I10 Essential (primary) hypertension: Secondary | ICD-10-CM | POA: Diagnosis not present

## 2018-08-08 DIAGNOSIS — E119 Type 2 diabetes mellitus without complications: Secondary | ICD-10-CM | POA: Diagnosis not present

## 2018-08-08 DIAGNOSIS — G10 Huntington's disease: Secondary | ICD-10-CM | POA: Diagnosis not present

## 2018-08-08 DIAGNOSIS — E559 Vitamin D deficiency, unspecified: Secondary | ICD-10-CM | POA: Diagnosis not present

## 2018-08-09 DIAGNOSIS — E559 Vitamin D deficiency, unspecified: Secondary | ICD-10-CM | POA: Diagnosis not present

## 2018-08-09 DIAGNOSIS — G10 Huntington's disease: Secondary | ICD-10-CM | POA: Diagnosis not present

## 2018-08-09 DIAGNOSIS — R4701 Aphasia: Secondary | ICD-10-CM | POA: Diagnosis not present

## 2018-08-09 DIAGNOSIS — I10 Essential (primary) hypertension: Secondary | ICD-10-CM | POA: Diagnosis not present

## 2018-08-09 DIAGNOSIS — E785 Hyperlipidemia, unspecified: Secondary | ICD-10-CM | POA: Diagnosis not present

## 2018-08-09 DIAGNOSIS — E119 Type 2 diabetes mellitus without complications: Secondary | ICD-10-CM | POA: Diagnosis not present

## 2018-08-10 DIAGNOSIS — I10 Essential (primary) hypertension: Secondary | ICD-10-CM | POA: Diagnosis not present

## 2018-08-10 DIAGNOSIS — G10 Huntington's disease: Secondary | ICD-10-CM | POA: Diagnosis not present

## 2018-08-10 DIAGNOSIS — R4701 Aphasia: Secondary | ICD-10-CM | POA: Diagnosis not present

## 2018-08-10 DIAGNOSIS — E785 Hyperlipidemia, unspecified: Secondary | ICD-10-CM | POA: Diagnosis not present

## 2018-08-10 DIAGNOSIS — E119 Type 2 diabetes mellitus without complications: Secondary | ICD-10-CM | POA: Diagnosis not present

## 2018-08-10 DIAGNOSIS — E559 Vitamin D deficiency, unspecified: Secondary | ICD-10-CM | POA: Diagnosis not present

## 2018-08-11 DIAGNOSIS — R4701 Aphasia: Secondary | ICD-10-CM | POA: Diagnosis not present

## 2018-08-11 DIAGNOSIS — E559 Vitamin D deficiency, unspecified: Secondary | ICD-10-CM | POA: Diagnosis not present

## 2018-08-11 DIAGNOSIS — I10 Essential (primary) hypertension: Secondary | ICD-10-CM | POA: Diagnosis not present

## 2018-08-11 DIAGNOSIS — E785 Hyperlipidemia, unspecified: Secondary | ICD-10-CM | POA: Diagnosis not present

## 2018-08-11 DIAGNOSIS — E119 Type 2 diabetes mellitus without complications: Secondary | ICD-10-CM | POA: Diagnosis not present

## 2018-08-11 DIAGNOSIS — G10 Huntington's disease: Secondary | ICD-10-CM | POA: Diagnosis not present

## 2018-08-13 ENCOUNTER — Telehealth: Payer: Self-pay

## 2018-08-13 ENCOUNTER — Other Ambulatory Visit: Payer: Self-pay

## 2018-08-13 DIAGNOSIS — R634 Abnormal weight loss: Secondary | ICD-10-CM

## 2018-08-13 DIAGNOSIS — R32 Unspecified urinary incontinence: Secondary | ICD-10-CM

## 2018-08-13 MED ORDER — UNABLE TO FIND: 0 refills | Status: DC

## 2018-08-13 MED ORDER — UNABLE TO FIND
0 refills | Status: DC
Start: 2018-08-13 — End: 2018-08-27

## 2018-08-13 NOTE — Telephone Encounter (Signed)
Under pads, pull ups and ensure ordered and faxed to [facility]

## 2018-08-14 DIAGNOSIS — R260 Ataxic gait: Secondary | ICD-10-CM | POA: Diagnosis not present

## 2018-08-14 DIAGNOSIS — G10 Huntington's disease: Secondary | ICD-10-CM | POA: Diagnosis not present

## 2018-08-14 DIAGNOSIS — Z79899 Other long term (current) drug therapy: Secondary | ICD-10-CM | POA: Diagnosis not present

## 2018-08-14 DIAGNOSIS — G4721 Circadian rhythm sleep disorder, delayed sleep phase type: Secondary | ICD-10-CM | POA: Diagnosis not present

## 2018-08-17 DIAGNOSIS — G10 Huntington's disease: Secondary | ICD-10-CM | POA: Diagnosis not present

## 2018-08-17 DIAGNOSIS — E559 Vitamin D deficiency, unspecified: Secondary | ICD-10-CM | POA: Diagnosis not present

## 2018-08-17 DIAGNOSIS — I10 Essential (primary) hypertension: Secondary | ICD-10-CM | POA: Diagnosis not present

## 2018-08-17 DIAGNOSIS — R4701 Aphasia: Secondary | ICD-10-CM | POA: Diagnosis not present

## 2018-08-17 DIAGNOSIS — E119 Type 2 diabetes mellitus without complications: Secondary | ICD-10-CM | POA: Diagnosis not present

## 2018-08-17 DIAGNOSIS — E785 Hyperlipidemia, unspecified: Secondary | ICD-10-CM | POA: Diagnosis not present

## 2018-08-19 DIAGNOSIS — R1310 Dysphagia, unspecified: Secondary | ICD-10-CM | POA: Diagnosis not present

## 2018-08-19 DIAGNOSIS — E559 Vitamin D deficiency, unspecified: Secondary | ICD-10-CM | POA: Diagnosis not present

## 2018-08-19 DIAGNOSIS — E119 Type 2 diabetes mellitus without complications: Secondary | ICD-10-CM | POA: Diagnosis not present

## 2018-08-19 DIAGNOSIS — I10 Essential (primary) hypertension: Secondary | ICD-10-CM | POA: Diagnosis not present

## 2018-08-19 DIAGNOSIS — E785 Hyperlipidemia, unspecified: Secondary | ICD-10-CM | POA: Diagnosis not present

## 2018-08-19 DIAGNOSIS — R4701 Aphasia: Secondary | ICD-10-CM | POA: Diagnosis not present

## 2018-08-19 DIAGNOSIS — G10 Huntington's disease: Secondary | ICD-10-CM | POA: Diagnosis not present

## 2018-08-20 DIAGNOSIS — I10 Essential (primary) hypertension: Secondary | ICD-10-CM | POA: Diagnosis not present

## 2018-08-20 DIAGNOSIS — E119 Type 2 diabetes mellitus without complications: Secondary | ICD-10-CM | POA: Diagnosis not present

## 2018-08-20 DIAGNOSIS — G10 Huntington's disease: Secondary | ICD-10-CM | POA: Diagnosis not present

## 2018-08-20 DIAGNOSIS — R4701 Aphasia: Secondary | ICD-10-CM | POA: Diagnosis not present

## 2018-08-20 DIAGNOSIS — E559 Vitamin D deficiency, unspecified: Secondary | ICD-10-CM | POA: Diagnosis not present

## 2018-08-20 DIAGNOSIS — E785 Hyperlipidemia, unspecified: Secondary | ICD-10-CM | POA: Diagnosis not present

## 2018-08-21 DIAGNOSIS — E785 Hyperlipidemia, unspecified: Secondary | ICD-10-CM | POA: Diagnosis not present

## 2018-08-21 DIAGNOSIS — E559 Vitamin D deficiency, unspecified: Secondary | ICD-10-CM | POA: Diagnosis not present

## 2018-08-21 DIAGNOSIS — E119 Type 2 diabetes mellitus without complications: Secondary | ICD-10-CM | POA: Diagnosis not present

## 2018-08-21 DIAGNOSIS — G10 Huntington's disease: Secondary | ICD-10-CM | POA: Diagnosis not present

## 2018-08-21 DIAGNOSIS — I10 Essential (primary) hypertension: Secondary | ICD-10-CM | POA: Diagnosis not present

## 2018-08-21 DIAGNOSIS — R4701 Aphasia: Secondary | ICD-10-CM | POA: Diagnosis not present

## 2018-08-27 ENCOUNTER — Telehealth: Payer: Self-pay

## 2018-08-27 DIAGNOSIS — R634 Abnormal weight loss: Secondary | ICD-10-CM

## 2018-08-27 DIAGNOSIS — R32 Unspecified urinary incontinence: Secondary | ICD-10-CM

## 2018-08-27 MED ORDER — UNABLE TO FIND: 2 refills | Status: DC

## 2018-08-27 NOTE — Telephone Encounter (Signed)
Underpads, pull ups, and Ensure scripts refilled for the CAPS program

## 2018-08-28 DIAGNOSIS — R4701 Aphasia: Secondary | ICD-10-CM | POA: Diagnosis not present

## 2018-08-28 DIAGNOSIS — E119 Type 2 diabetes mellitus without complications: Secondary | ICD-10-CM | POA: Diagnosis not present

## 2018-08-28 DIAGNOSIS — E785 Hyperlipidemia, unspecified: Secondary | ICD-10-CM | POA: Diagnosis not present

## 2018-08-28 DIAGNOSIS — I10 Essential (primary) hypertension: Secondary | ICD-10-CM | POA: Diagnosis not present

## 2018-08-28 DIAGNOSIS — E559 Vitamin D deficiency, unspecified: Secondary | ICD-10-CM | POA: Diagnosis not present

## 2018-08-28 DIAGNOSIS — G10 Huntington's disease: Secondary | ICD-10-CM | POA: Diagnosis not present

## 2018-09-04 DIAGNOSIS — R4701 Aphasia: Secondary | ICD-10-CM | POA: Diagnosis not present

## 2018-09-04 DIAGNOSIS — E559 Vitamin D deficiency, unspecified: Secondary | ICD-10-CM | POA: Diagnosis not present

## 2018-09-04 DIAGNOSIS — E119 Type 2 diabetes mellitus without complications: Secondary | ICD-10-CM | POA: Diagnosis not present

## 2018-09-04 DIAGNOSIS — G10 Huntington's disease: Secondary | ICD-10-CM | POA: Diagnosis not present

## 2018-09-04 DIAGNOSIS — I10 Essential (primary) hypertension: Secondary | ICD-10-CM | POA: Diagnosis not present

## 2018-09-04 DIAGNOSIS — E785 Hyperlipidemia, unspecified: Secondary | ICD-10-CM | POA: Diagnosis not present

## 2018-09-13 ENCOUNTER — Ambulatory Visit: Payer: Self-pay

## 2018-09-13 DIAGNOSIS — E559 Vitamin D deficiency, unspecified: Secondary | ICD-10-CM | POA: Diagnosis not present

## 2018-09-13 DIAGNOSIS — I10 Essential (primary) hypertension: Secondary | ICD-10-CM | POA: Diagnosis not present

## 2018-09-13 DIAGNOSIS — E119 Type 2 diabetes mellitus without complications: Secondary | ICD-10-CM | POA: Diagnosis not present

## 2018-09-13 DIAGNOSIS — E785 Hyperlipidemia, unspecified: Secondary | ICD-10-CM | POA: Diagnosis not present

## 2018-09-13 DIAGNOSIS — G10 Huntington's disease: Secondary | ICD-10-CM | POA: Diagnosis not present

## 2018-09-13 DIAGNOSIS — R4701 Aphasia: Secondary | ICD-10-CM | POA: Diagnosis not present

## 2018-09-17 DIAGNOSIS — G10 Huntington's disease: Secondary | ICD-10-CM | POA: Diagnosis not present

## 2018-09-17 DIAGNOSIS — R4701 Aphasia: Secondary | ICD-10-CM | POA: Diagnosis not present

## 2018-09-17 DIAGNOSIS — E119 Type 2 diabetes mellitus without complications: Secondary | ICD-10-CM | POA: Diagnosis not present

## 2018-09-17 DIAGNOSIS — E559 Vitamin D deficiency, unspecified: Secondary | ICD-10-CM | POA: Diagnosis not present

## 2018-09-17 DIAGNOSIS — I10 Essential (primary) hypertension: Secondary | ICD-10-CM | POA: Diagnosis not present

## 2018-09-17 DIAGNOSIS — E785 Hyperlipidemia, unspecified: Secondary | ICD-10-CM | POA: Diagnosis not present

## 2018-09-19 DIAGNOSIS — G10 Huntington's disease: Secondary | ICD-10-CM | POA: Diagnosis not present

## 2018-09-19 DIAGNOSIS — E119 Type 2 diabetes mellitus without complications: Secondary | ICD-10-CM | POA: Diagnosis not present

## 2018-09-19 DIAGNOSIS — I10 Essential (primary) hypertension: Secondary | ICD-10-CM | POA: Diagnosis not present

## 2018-09-19 DIAGNOSIS — R4701 Aphasia: Secondary | ICD-10-CM | POA: Diagnosis not present

## 2018-09-19 DIAGNOSIS — E559 Vitamin D deficiency, unspecified: Secondary | ICD-10-CM | POA: Diagnosis not present

## 2018-09-19 DIAGNOSIS — R1310 Dysphagia, unspecified: Secondary | ICD-10-CM | POA: Diagnosis not present

## 2018-09-19 DIAGNOSIS — E785 Hyperlipidemia, unspecified: Secondary | ICD-10-CM | POA: Diagnosis not present

## 2018-09-27 DIAGNOSIS — E785 Hyperlipidemia, unspecified: Secondary | ICD-10-CM | POA: Diagnosis not present

## 2018-09-27 DIAGNOSIS — E559 Vitamin D deficiency, unspecified: Secondary | ICD-10-CM | POA: Diagnosis not present

## 2018-09-27 DIAGNOSIS — R4701 Aphasia: Secondary | ICD-10-CM | POA: Diagnosis not present

## 2018-09-27 DIAGNOSIS — G10 Huntington's disease: Secondary | ICD-10-CM | POA: Diagnosis not present

## 2018-09-27 DIAGNOSIS — E119 Type 2 diabetes mellitus without complications: Secondary | ICD-10-CM | POA: Diagnosis not present

## 2018-09-27 DIAGNOSIS — I10 Essential (primary) hypertension: Secondary | ICD-10-CM | POA: Diagnosis not present

## 2018-10-05 DIAGNOSIS — I10 Essential (primary) hypertension: Secondary | ICD-10-CM | POA: Diagnosis not present

## 2018-10-05 DIAGNOSIS — E785 Hyperlipidemia, unspecified: Secondary | ICD-10-CM | POA: Diagnosis not present

## 2018-10-05 DIAGNOSIS — R4701 Aphasia: Secondary | ICD-10-CM | POA: Diagnosis not present

## 2018-10-05 DIAGNOSIS — E119 Type 2 diabetes mellitus without complications: Secondary | ICD-10-CM | POA: Diagnosis not present

## 2018-10-05 DIAGNOSIS — G10 Huntington's disease: Secondary | ICD-10-CM | POA: Diagnosis not present

## 2018-10-05 DIAGNOSIS — E559 Vitamin D deficiency, unspecified: Secondary | ICD-10-CM | POA: Diagnosis not present

## 2018-10-10 DIAGNOSIS — R4701 Aphasia: Secondary | ICD-10-CM | POA: Diagnosis not present

## 2018-10-10 DIAGNOSIS — E119 Type 2 diabetes mellitus without complications: Secondary | ICD-10-CM | POA: Diagnosis not present

## 2018-10-10 DIAGNOSIS — G10 Huntington's disease: Secondary | ICD-10-CM | POA: Diagnosis not present

## 2018-10-10 DIAGNOSIS — E785 Hyperlipidemia, unspecified: Secondary | ICD-10-CM | POA: Diagnosis not present

## 2018-10-10 DIAGNOSIS — I10 Essential (primary) hypertension: Secondary | ICD-10-CM | POA: Diagnosis not present

## 2018-10-10 DIAGNOSIS — E559 Vitamin D deficiency, unspecified: Secondary | ICD-10-CM | POA: Diagnosis not present

## 2018-10-14 ENCOUNTER — Encounter: Payer: Self-pay | Admitting: Obstetrics and Gynecology

## 2018-10-14 ENCOUNTER — Encounter: Payer: Self-pay | Admitting: Adult Health

## 2018-10-15 NOTE — Progress Notes (Signed)
REVIEWED-NO ADDITIONAL RECOMMENDATIONS. 

## 2018-10-17 DIAGNOSIS — E559 Vitamin D deficiency, unspecified: Secondary | ICD-10-CM | POA: Diagnosis not present

## 2018-10-17 DIAGNOSIS — G10 Huntington's disease: Secondary | ICD-10-CM | POA: Diagnosis not present

## 2018-10-17 DIAGNOSIS — R4701 Aphasia: Secondary | ICD-10-CM | POA: Diagnosis not present

## 2018-10-17 DIAGNOSIS — E785 Hyperlipidemia, unspecified: Secondary | ICD-10-CM | POA: Diagnosis not present

## 2018-10-17 DIAGNOSIS — I10 Essential (primary) hypertension: Secondary | ICD-10-CM | POA: Diagnosis not present

## 2018-10-17 DIAGNOSIS — E119 Type 2 diabetes mellitus without complications: Secondary | ICD-10-CM | POA: Diagnosis not present

## 2018-10-18 DIAGNOSIS — E119 Type 2 diabetes mellitus without complications: Secondary | ICD-10-CM | POA: Diagnosis not present

## 2018-10-18 DIAGNOSIS — I10 Essential (primary) hypertension: Secondary | ICD-10-CM | POA: Diagnosis not present

## 2018-10-18 DIAGNOSIS — E785 Hyperlipidemia, unspecified: Secondary | ICD-10-CM | POA: Diagnosis not present

## 2018-10-18 DIAGNOSIS — E559 Vitamin D deficiency, unspecified: Secondary | ICD-10-CM | POA: Diagnosis not present

## 2018-10-18 DIAGNOSIS — R4701 Aphasia: Secondary | ICD-10-CM | POA: Diagnosis not present

## 2018-10-18 DIAGNOSIS — G10 Huntington's disease: Secondary | ICD-10-CM | POA: Diagnosis not present

## 2018-10-19 DIAGNOSIS — R1310 Dysphagia, unspecified: Secondary | ICD-10-CM | POA: Diagnosis not present

## 2018-10-19 DIAGNOSIS — I10 Essential (primary) hypertension: Secondary | ICD-10-CM | POA: Diagnosis not present

## 2018-10-19 DIAGNOSIS — E559 Vitamin D deficiency, unspecified: Secondary | ICD-10-CM | POA: Diagnosis not present

## 2018-10-19 DIAGNOSIS — G10 Huntington's disease: Secondary | ICD-10-CM | POA: Diagnosis not present

## 2018-10-19 DIAGNOSIS — E119 Type 2 diabetes mellitus without complications: Secondary | ICD-10-CM | POA: Diagnosis not present

## 2018-10-19 DIAGNOSIS — R4701 Aphasia: Secondary | ICD-10-CM | POA: Diagnosis not present

## 2018-10-19 DIAGNOSIS — E785 Hyperlipidemia, unspecified: Secondary | ICD-10-CM | POA: Diagnosis not present

## 2018-10-24 ENCOUNTER — Encounter: Payer: Self-pay | Admitting: Family Medicine

## 2018-10-24 ENCOUNTER — Other Ambulatory Visit: Payer: Self-pay

## 2018-10-24 ENCOUNTER — Ambulatory Visit (INDEPENDENT_AMBULATORY_CARE_PROVIDER_SITE_OTHER): Payer: Medicare Other | Admitting: Family Medicine

## 2018-10-24 VITALS — BP 118/80 | Ht 64.0 in | Wt 140.0 lb

## 2018-10-24 DIAGNOSIS — R32 Unspecified urinary incontinence: Secondary | ICD-10-CM

## 2018-10-24 DIAGNOSIS — F0393 Unspecified dementia, unspecified severity, with mood disturbance: Secondary | ICD-10-CM

## 2018-10-24 DIAGNOSIS — F0280 Dementia in other diseases classified elsewhere without behavioral disturbance: Secondary | ICD-10-CM | POA: Diagnosis not present

## 2018-10-24 DIAGNOSIS — E785 Hyperlipidemia, unspecified: Secondary | ICD-10-CM

## 2018-10-24 DIAGNOSIS — F329 Major depressive disorder, single episode, unspecified: Secondary | ICD-10-CM

## 2018-10-24 DIAGNOSIS — G10 Huntington's disease: Secondary | ICD-10-CM | POA: Diagnosis not present

## 2018-10-24 MED ORDER — ERGOCALCIFEROL 1.25 MG (50000 UT) PO CAPS: 50000.0000 [IU] | ORAL_CAPSULE | ORAL | 1 refills | Status: AC

## 2018-10-24 NOTE — Patient Instructions (Addendum)
F/U  In 4.5 months, call if you need me before  You are prescribed once weekly vitamin D  Your bowel movements are good on your current regime , so keep doing what you are doing!  I am thankful that you, your Mom and your Aunt continue to do very well together with the help of Hospice  Thanks for choosing [facility], we consider it a privelige to serve you.

## 2018-10-24 NOTE — Progress Notes (Signed)
Virtual Visit via Telephone Note  I connected with Victoria [name] on 10/24/18 at  1:40 PM EDT by telephone and verified that I am speaking with the correct person using two identifiers.  Location: Patient: home Provider: office   I discussed the limitations, risks, security and privacy concerns of performing an evaluation and management service by telephone and the availability of in person appointments. I also discussed with the patient that there may be a patient responsible charge related to this service. The patient expressed understanding and agreed to proceed.   History of Present Illness: F/U chronic problems Overall fair per report of Mother and Aunt, deny skin breakdown, both very aware of the terminal nature of her illness History is from family, pt aphasic Denies recent fever or chills.Food intake remains pooor Denies , nasal congestion,  Denies chest congestion, productive cough or wheezing. Denies chest pains, palpitations and leg swelling Denies  vomiting,diarrhea or constipation.   C/O chronic  incontinence. Marked  limitation in mobility. . Denies symptoms of uncontrolled depression, anxiety or insomnia. Denies skin break down or rash.       Observations/Objective: BP 118/80   Ht 5\' 4"  (1.626 m)   Wt 140 lb (63.5 kg)   BMI 24.03 kg/m   Assessment and Plan: HUNTINGTON'S DISEASE Progressive deterioration in level of function, but with Hospice care in place, Mother and Aunt feel supported and less stressed  Depression due to dementia Controlled on medication  Hyperlipidemia with target LDL less than 100 Maintained on statin therapy  Urinary incontinence in female Relies on incontinent supplies    Follow Up Instructions:    I discussed the assessment and treatment plan with the patient. The patient was provided an opportunity to ask questions and all were answered. The patient agreed with the plan and demonstrated an understanding of the  instructions.   The patient was advised to call back or seek an in-person evaluation if the symptoms worsen or if the condition fails to improve as anticipated.  I provided 21 minutes of non-face-to-face time during this encounter.   Tula Nakayama, MD

## 2018-10-25 DIAGNOSIS — I10 Essential (primary) hypertension: Secondary | ICD-10-CM | POA: Diagnosis not present

## 2018-10-25 DIAGNOSIS — G10 Huntington's disease: Secondary | ICD-10-CM | POA: Diagnosis not present

## 2018-10-25 DIAGNOSIS — E559 Vitamin D deficiency, unspecified: Secondary | ICD-10-CM | POA: Diagnosis not present

## 2018-10-25 DIAGNOSIS — E119 Type 2 diabetes mellitus without complications: Secondary | ICD-10-CM | POA: Diagnosis not present

## 2018-10-25 DIAGNOSIS — R4701 Aphasia: Secondary | ICD-10-CM | POA: Diagnosis not present

## 2018-10-25 DIAGNOSIS — E785 Hyperlipidemia, unspecified: Secondary | ICD-10-CM | POA: Diagnosis not present

## 2018-10-26 DIAGNOSIS — E559 Vitamin D deficiency, unspecified: Secondary | ICD-10-CM | POA: Diagnosis not present

## 2018-10-26 DIAGNOSIS — G10 Huntington's disease: Secondary | ICD-10-CM | POA: Diagnosis not present

## 2018-10-26 DIAGNOSIS — E785 Hyperlipidemia, unspecified: Secondary | ICD-10-CM | POA: Diagnosis not present

## 2018-10-26 DIAGNOSIS — E119 Type 2 diabetes mellitus without complications: Secondary | ICD-10-CM | POA: Diagnosis not present

## 2018-10-26 DIAGNOSIS — I10 Essential (primary) hypertension: Secondary | ICD-10-CM | POA: Diagnosis not present

## 2018-10-26 DIAGNOSIS — R4701 Aphasia: Secondary | ICD-10-CM | POA: Diagnosis not present

## 2018-10-27 NOTE — Assessment & Plan Note (Signed)
Maintained on statin therapy

## 2018-10-27 NOTE — Assessment & Plan Note (Signed)
Relies on incontinent supplies

## 2018-10-27 NOTE — Assessment & Plan Note (Signed)
Progressive deterioration in level of function, but with Hospice care in place, Mother and Aunt feel supported and less stressed

## 2018-10-27 NOTE — Assessment & Plan Note (Signed)
Controlled on medication

## 2018-10-31 DIAGNOSIS — E559 Vitamin D deficiency, unspecified: Secondary | ICD-10-CM | POA: Diagnosis not present

## 2018-10-31 DIAGNOSIS — G10 Huntington's disease: Secondary | ICD-10-CM | POA: Diagnosis not present

## 2018-10-31 DIAGNOSIS — E119 Type 2 diabetes mellitus without complications: Secondary | ICD-10-CM | POA: Diagnosis not present

## 2018-10-31 DIAGNOSIS — I10 Essential (primary) hypertension: Secondary | ICD-10-CM | POA: Diagnosis not present

## 2018-10-31 DIAGNOSIS — R4701 Aphasia: Secondary | ICD-10-CM | POA: Diagnosis not present

## 2018-10-31 DIAGNOSIS — E785 Hyperlipidemia, unspecified: Secondary | ICD-10-CM | POA: Diagnosis not present

## 2018-11-02 ENCOUNTER — Telehealth: Payer: Self-pay

## 2018-11-02 MED ORDER — UNABLE TO FIND: 0 refills | Status: DC

## 2018-11-02 NOTE — Telephone Encounter (Signed)
Prescription entered and faxed for CAP program

## 2018-11-07 DIAGNOSIS — R4701 Aphasia: Secondary | ICD-10-CM | POA: Diagnosis not present

## 2018-11-07 DIAGNOSIS — E119 Type 2 diabetes mellitus without complications: Secondary | ICD-10-CM | POA: Diagnosis not present

## 2018-11-07 DIAGNOSIS — E785 Hyperlipidemia, unspecified: Secondary | ICD-10-CM | POA: Diagnosis not present

## 2018-11-07 DIAGNOSIS — G10 Huntington's disease: Secondary | ICD-10-CM | POA: Diagnosis not present

## 2018-11-07 DIAGNOSIS — I10 Essential (primary) hypertension: Secondary | ICD-10-CM | POA: Diagnosis not present

## 2018-11-07 DIAGNOSIS — E559 Vitamin D deficiency, unspecified: Secondary | ICD-10-CM | POA: Diagnosis not present

## 2018-11-14 DIAGNOSIS — E785 Hyperlipidemia, unspecified: Secondary | ICD-10-CM | POA: Diagnosis not present

## 2018-11-14 DIAGNOSIS — I10 Essential (primary) hypertension: Secondary | ICD-10-CM | POA: Diagnosis not present

## 2018-11-14 DIAGNOSIS — G10 Huntington's disease: Secondary | ICD-10-CM | POA: Diagnosis not present

## 2018-11-14 DIAGNOSIS — E559 Vitamin D deficiency, unspecified: Secondary | ICD-10-CM | POA: Diagnosis not present

## 2018-11-14 DIAGNOSIS — E119 Type 2 diabetes mellitus without complications: Secondary | ICD-10-CM | POA: Diagnosis not present

## 2018-11-14 DIAGNOSIS — R4701 Aphasia: Secondary | ICD-10-CM | POA: Diagnosis not present

## 2018-11-19 DIAGNOSIS — R4701 Aphasia: Secondary | ICD-10-CM | POA: Diagnosis not present

## 2018-11-19 DIAGNOSIS — G10 Huntington's disease: Secondary | ICD-10-CM | POA: Diagnosis not present

## 2018-11-19 DIAGNOSIS — E785 Hyperlipidemia, unspecified: Secondary | ICD-10-CM | POA: Diagnosis not present

## 2018-11-19 DIAGNOSIS — E559 Vitamin D deficiency, unspecified: Secondary | ICD-10-CM | POA: Diagnosis not present

## 2018-11-19 DIAGNOSIS — R1310 Dysphagia, unspecified: Secondary | ICD-10-CM | POA: Diagnosis not present

## 2018-11-19 DIAGNOSIS — I10 Essential (primary) hypertension: Secondary | ICD-10-CM | POA: Diagnosis not present

## 2018-11-19 DIAGNOSIS — E119 Type 2 diabetes mellitus without complications: Secondary | ICD-10-CM | POA: Diagnosis not present

## 2018-11-21 DIAGNOSIS — E119 Type 2 diabetes mellitus without complications: Secondary | ICD-10-CM | POA: Diagnosis not present

## 2018-11-21 DIAGNOSIS — E559 Vitamin D deficiency, unspecified: Secondary | ICD-10-CM | POA: Diagnosis not present

## 2018-11-21 DIAGNOSIS — E785 Hyperlipidemia, unspecified: Secondary | ICD-10-CM | POA: Diagnosis not present

## 2018-11-21 DIAGNOSIS — I10 Essential (primary) hypertension: Secondary | ICD-10-CM | POA: Diagnosis not present

## 2018-11-21 DIAGNOSIS — G10 Huntington's disease: Secondary | ICD-10-CM | POA: Diagnosis not present

## 2018-11-21 DIAGNOSIS — R4701 Aphasia: Secondary | ICD-10-CM | POA: Diagnosis not present

## 2018-12-05 DIAGNOSIS — E559 Vitamin D deficiency, unspecified: Secondary | ICD-10-CM | POA: Diagnosis not present

## 2018-12-05 DIAGNOSIS — I10 Essential (primary) hypertension: Secondary | ICD-10-CM | POA: Diagnosis not present

## 2018-12-05 DIAGNOSIS — R4701 Aphasia: Secondary | ICD-10-CM | POA: Diagnosis not present

## 2018-12-05 DIAGNOSIS — E119 Type 2 diabetes mellitus without complications: Secondary | ICD-10-CM | POA: Diagnosis not present

## 2018-12-05 DIAGNOSIS — E785 Hyperlipidemia, unspecified: Secondary | ICD-10-CM | POA: Diagnosis not present

## 2018-12-05 DIAGNOSIS — G10 Huntington's disease: Secondary | ICD-10-CM | POA: Diagnosis not present

## 2018-12-07 DIAGNOSIS — E119 Type 2 diabetes mellitus without complications: Secondary | ICD-10-CM | POA: Diagnosis not present

## 2018-12-07 DIAGNOSIS — G10 Huntington's disease: Secondary | ICD-10-CM | POA: Diagnosis not present

## 2018-12-07 DIAGNOSIS — E559 Vitamin D deficiency, unspecified: Secondary | ICD-10-CM | POA: Diagnosis not present

## 2018-12-07 DIAGNOSIS — R4701 Aphasia: Secondary | ICD-10-CM | POA: Diagnosis not present

## 2018-12-07 DIAGNOSIS — I10 Essential (primary) hypertension: Secondary | ICD-10-CM | POA: Diagnosis not present

## 2018-12-07 DIAGNOSIS — E785 Hyperlipidemia, unspecified: Secondary | ICD-10-CM | POA: Diagnosis not present

## 2018-12-11 ENCOUNTER — Other Ambulatory Visit: Payer: Self-pay | Admitting: Family Medicine

## 2018-12-17 DIAGNOSIS — R4701 Aphasia: Secondary | ICD-10-CM

## 2018-12-17 DIAGNOSIS — E119 Type 2 diabetes mellitus without complications: Secondary | ICD-10-CM

## 2018-12-17 DIAGNOSIS — G10 Huntington's disease: Secondary | ICD-10-CM

## 2018-12-17 DIAGNOSIS — E559 Vitamin D deficiency, unspecified: Secondary | ICD-10-CM

## 2018-12-17 DIAGNOSIS — I10 Essential (primary) hypertension: Secondary | ICD-10-CM

## 2018-12-17 DIAGNOSIS — R1310 Dysphagia, unspecified: Secondary | ICD-10-CM | POA: Diagnosis not present

## 2018-12-17 DIAGNOSIS — E785 Hyperlipidemia, unspecified: Secondary | ICD-10-CM

## 2018-12-19 DIAGNOSIS — E785 Hyperlipidemia, unspecified: Secondary | ICD-10-CM | POA: Diagnosis not present

## 2018-12-19 DIAGNOSIS — E559 Vitamin D deficiency, unspecified: Secondary | ICD-10-CM | POA: Diagnosis not present

## 2018-12-19 DIAGNOSIS — E119 Type 2 diabetes mellitus without complications: Secondary | ICD-10-CM | POA: Diagnosis not present

## 2018-12-19 DIAGNOSIS — R4701 Aphasia: Secondary | ICD-10-CM | POA: Diagnosis not present

## 2018-12-19 DIAGNOSIS — I10 Essential (primary) hypertension: Secondary | ICD-10-CM | POA: Diagnosis not present

## 2018-12-19 DIAGNOSIS — R1310 Dysphagia, unspecified: Secondary | ICD-10-CM | POA: Diagnosis not present

## 2018-12-19 DIAGNOSIS — G10 Huntington's disease: Secondary | ICD-10-CM | POA: Diagnosis not present

## 2018-12-21 DIAGNOSIS — I10 Essential (primary) hypertension: Secondary | ICD-10-CM | POA: Diagnosis not present

## 2018-12-21 DIAGNOSIS — E559 Vitamin D deficiency, unspecified: Secondary | ICD-10-CM | POA: Diagnosis not present

## 2018-12-21 DIAGNOSIS — R1310 Dysphagia, unspecified: Secondary | ICD-10-CM | POA: Diagnosis not present

## 2018-12-21 DIAGNOSIS — G10 Huntington's disease: Secondary | ICD-10-CM | POA: Diagnosis not present

## 2018-12-21 DIAGNOSIS — R4701 Aphasia: Secondary | ICD-10-CM | POA: Diagnosis not present

## 2018-12-21 DIAGNOSIS — E785 Hyperlipidemia, unspecified: Secondary | ICD-10-CM | POA: Diagnosis not present

## 2018-12-21 DIAGNOSIS — E119 Type 2 diabetes mellitus without complications: Secondary | ICD-10-CM | POA: Diagnosis not present

## 2018-12-26 ENCOUNTER — Other Ambulatory Visit: Payer: Self-pay

## 2018-12-26 MED ORDER — CITALOPRAM HYDROBROMIDE 20 MG PO TABS: ORAL_TABLET | ORAL | 0 refills | Status: DC

## 2019-01-03 DIAGNOSIS — E119 Type 2 diabetes mellitus without complications: Secondary | ICD-10-CM | POA: Diagnosis not present

## 2019-01-03 DIAGNOSIS — R4701 Aphasia: Secondary | ICD-10-CM | POA: Diagnosis not present

## 2019-01-03 DIAGNOSIS — E559 Vitamin D deficiency, unspecified: Secondary | ICD-10-CM | POA: Diagnosis not present

## 2019-01-03 DIAGNOSIS — I10 Essential (primary) hypertension: Secondary | ICD-10-CM | POA: Diagnosis not present

## 2019-01-03 DIAGNOSIS — G10 Huntington's disease: Secondary | ICD-10-CM | POA: Diagnosis not present

## 2019-01-03 DIAGNOSIS — E785 Hyperlipidemia, unspecified: Secondary | ICD-10-CM | POA: Diagnosis not present

## 2019-01-08 DIAGNOSIS — E559 Vitamin D deficiency, unspecified: Secondary | ICD-10-CM | POA: Diagnosis not present

## 2019-01-08 DIAGNOSIS — E785 Hyperlipidemia, unspecified: Secondary | ICD-10-CM | POA: Diagnosis not present

## 2019-01-08 DIAGNOSIS — E119 Type 2 diabetes mellitus without complications: Secondary | ICD-10-CM | POA: Diagnosis not present

## 2019-01-08 DIAGNOSIS — I10 Essential (primary) hypertension: Secondary | ICD-10-CM | POA: Diagnosis not present

## 2019-01-08 DIAGNOSIS — G10 Huntington's disease: Secondary | ICD-10-CM | POA: Diagnosis not present

## 2019-01-08 DIAGNOSIS — R4701 Aphasia: Secondary | ICD-10-CM | POA: Diagnosis not present

## 2019-01-18 DIAGNOSIS — R4701 Aphasia: Secondary | ICD-10-CM | POA: Diagnosis not present

## 2019-01-18 DIAGNOSIS — I10 Essential (primary) hypertension: Secondary | ICD-10-CM | POA: Diagnosis not present

## 2019-01-18 DIAGNOSIS — E119 Type 2 diabetes mellitus without complications: Secondary | ICD-10-CM | POA: Diagnosis not present

## 2019-01-18 DIAGNOSIS — G10 Huntington's disease: Secondary | ICD-10-CM | POA: Diagnosis not present

## 2019-01-18 DIAGNOSIS — E785 Hyperlipidemia, unspecified: Secondary | ICD-10-CM | POA: Diagnosis not present

## 2019-01-18 DIAGNOSIS — E559 Vitamin D deficiency, unspecified: Secondary | ICD-10-CM | POA: Diagnosis not present

## 2019-01-19 DIAGNOSIS — E785 Hyperlipidemia, unspecified: Secondary | ICD-10-CM | POA: Diagnosis not present

## 2019-01-19 DIAGNOSIS — I10 Essential (primary) hypertension: Secondary | ICD-10-CM | POA: Diagnosis not present

## 2019-01-19 DIAGNOSIS — G10 Huntington's disease: Secondary | ICD-10-CM | POA: Diagnosis not present

## 2019-01-19 DIAGNOSIS — F330 Major depressive disorder, recurrent, mild: Secondary | ICD-10-CM | POA: Diagnosis not present

## 2019-01-19 DIAGNOSIS — R1310 Dysphagia, unspecified: Secondary | ICD-10-CM | POA: Diagnosis not present

## 2019-01-19 DIAGNOSIS — R4701 Aphasia: Secondary | ICD-10-CM | POA: Diagnosis not present

## 2019-01-19 DIAGNOSIS — R159 Full incontinence of feces: Secondary | ICD-10-CM | POA: Diagnosis not present

## 2019-01-19 DIAGNOSIS — R32 Unspecified urinary incontinence: Secondary | ICD-10-CM | POA: Diagnosis not present

## 2019-01-19 DIAGNOSIS — K5900 Constipation, unspecified: Secondary | ICD-10-CM | POA: Diagnosis not present

## 2019-01-19 DIAGNOSIS — E119 Type 2 diabetes mellitus without complications: Secondary | ICD-10-CM | POA: Diagnosis not present

## 2019-01-19 DIAGNOSIS — E559 Vitamin D deficiency, unspecified: Secondary | ICD-10-CM | POA: Diagnosis not present

## 2019-01-30 DIAGNOSIS — G10 Huntington's disease: Secondary | ICD-10-CM | POA: Diagnosis not present

## 2019-01-30 DIAGNOSIS — R4701 Aphasia: Secondary | ICD-10-CM | POA: Diagnosis not present

## 2019-01-30 DIAGNOSIS — E785 Hyperlipidemia, unspecified: Secondary | ICD-10-CM | POA: Diagnosis not present

## 2019-01-30 DIAGNOSIS — E559 Vitamin D deficiency, unspecified: Secondary | ICD-10-CM | POA: Diagnosis not present

## 2019-01-30 DIAGNOSIS — E119 Type 2 diabetes mellitus without complications: Secondary | ICD-10-CM | POA: Diagnosis not present

## 2019-01-30 DIAGNOSIS — I10 Essential (primary) hypertension: Secondary | ICD-10-CM | POA: Diagnosis not present

## 2019-02-06 DIAGNOSIS — E559 Vitamin D deficiency, unspecified: Secondary | ICD-10-CM | POA: Diagnosis not present

## 2019-02-06 DIAGNOSIS — E119 Type 2 diabetes mellitus without complications: Secondary | ICD-10-CM | POA: Diagnosis not present

## 2019-02-06 DIAGNOSIS — G10 Huntington's disease: Secondary | ICD-10-CM | POA: Diagnosis not present

## 2019-02-06 DIAGNOSIS — E785 Hyperlipidemia, unspecified: Secondary | ICD-10-CM | POA: Diagnosis not present

## 2019-02-06 DIAGNOSIS — R4701 Aphasia: Secondary | ICD-10-CM | POA: Diagnosis not present

## 2019-02-06 DIAGNOSIS — I10 Essential (primary) hypertension: Secondary | ICD-10-CM | POA: Diagnosis not present

## 2019-02-11 DIAGNOSIS — E559 Vitamin D deficiency, unspecified: Secondary | ICD-10-CM | POA: Diagnosis not present

## 2019-02-11 DIAGNOSIS — G10 Huntington's disease: Secondary | ICD-10-CM | POA: Diagnosis not present

## 2019-02-11 DIAGNOSIS — I10 Essential (primary) hypertension: Secondary | ICD-10-CM | POA: Diagnosis not present

## 2019-02-11 DIAGNOSIS — E785 Hyperlipidemia, unspecified: Secondary | ICD-10-CM | POA: Diagnosis not present

## 2019-02-11 DIAGNOSIS — E119 Type 2 diabetes mellitus without complications: Secondary | ICD-10-CM | POA: Diagnosis not present

## 2019-02-11 DIAGNOSIS — R4701 Aphasia: Secondary | ICD-10-CM | POA: Diagnosis not present

## 2019-02-14 DIAGNOSIS — E119 Type 2 diabetes mellitus without complications: Secondary | ICD-10-CM | POA: Diagnosis not present

## 2019-02-14 DIAGNOSIS — E559 Vitamin D deficiency, unspecified: Secondary | ICD-10-CM | POA: Diagnosis not present

## 2019-02-14 DIAGNOSIS — G10 Huntington's disease: Secondary | ICD-10-CM | POA: Diagnosis not present

## 2019-02-14 DIAGNOSIS — E785 Hyperlipidemia, unspecified: Secondary | ICD-10-CM | POA: Diagnosis not present

## 2019-02-14 DIAGNOSIS — R4701 Aphasia: Secondary | ICD-10-CM | POA: Diagnosis not present

## 2019-02-14 DIAGNOSIS — I10 Essential (primary) hypertension: Secondary | ICD-10-CM | POA: Diagnosis not present

## 2019-02-19 ENCOUNTER — Telehealth: Payer: Self-pay | Admitting: Family Medicine

## 2019-02-19 DIAGNOSIS — R1310 Dysphagia, unspecified: Secondary | ICD-10-CM | POA: Diagnosis not present

## 2019-02-19 DIAGNOSIS — I10 Essential (primary) hypertension: Secondary | ICD-10-CM | POA: Diagnosis not present

## 2019-02-19 DIAGNOSIS — M6281 Muscle weakness (generalized): Secondary | ICD-10-CM

## 2019-02-19 DIAGNOSIS — G10 Huntington's disease: Secondary | ICD-10-CM | POA: Diagnosis not present

## 2019-02-19 DIAGNOSIS — E785 Hyperlipidemia, unspecified: Secondary | ICD-10-CM | POA: Diagnosis not present

## 2019-02-19 DIAGNOSIS — E119 Type 2 diabetes mellitus without complications: Secondary | ICD-10-CM | POA: Diagnosis not present

## 2019-02-19 DIAGNOSIS — R32 Unspecified urinary incontinence: Secondary | ICD-10-CM | POA: Diagnosis not present

## 2019-02-19 DIAGNOSIS — K5900 Constipation, unspecified: Secondary | ICD-10-CM | POA: Diagnosis not present

## 2019-02-19 DIAGNOSIS — G319 Degenerative disease of nervous system, unspecified: Secondary | ICD-10-CM

## 2019-02-19 DIAGNOSIS — E559 Vitamin D deficiency, unspecified: Secondary | ICD-10-CM | POA: Diagnosis not present

## 2019-02-19 DIAGNOSIS — F330 Major depressive disorder, recurrent, mild: Secondary | ICD-10-CM | POA: Diagnosis not present

## 2019-02-19 DIAGNOSIS — R159 Full incontinence of feces: Secondary | ICD-10-CM | POA: Diagnosis not present

## 2019-02-19 DIAGNOSIS — R279 Unspecified lack of coordination: Secondary | ICD-10-CM

## 2019-02-19 DIAGNOSIS — R4701 Aphasia: Secondary | ICD-10-CM | POA: Diagnosis not present

## 2019-02-19 NOTE — Telephone Encounter (Signed)
Faxed dme order for hospital bed to [facility]

## 2019-02-19 NOTE — Telephone Encounter (Signed)
Hospice is calling to advised that they are discharging the patient. And that they are removing there DME , There needs to be a order placed for a hospital bed for the pt

## 2019-02-20 ENCOUNTER — Telehealth: Payer: Self-pay | Admitting: *Deleted

## 2019-02-20 ENCOUNTER — Telehealth: Payer: Self-pay

## 2019-02-20 ENCOUNTER — Other Ambulatory Visit: Payer: Self-pay

## 2019-02-20 MED ORDER — UNABLE TO FIND: 0 refills | Status: DC

## 2019-02-20 NOTE — Telephone Encounter (Signed)
Spoke with patients mother. Patient has been discharged from respite care and hospice. In need of certain medical equipment. Made patient an appt to see NP on 02/26/2019

## 2019-02-20 NOTE — Telephone Encounter (Signed)
Mother stated she had spoken with Velna Hatchet prior to patient being discharged from respite and Velna Hatchet stated there would be options that could be put in place. She wanted to let Velna Hatchet know she has now been discharged and wanted to go ahead and get those options put in place and wanted to know where to go from her. I let her know Velna Hatchet was out of the office until Monday but I would leave her a message.

## 2019-02-20 NOTE — Telephone Encounter (Signed)
Pts mother called pt was respite care at hospice. She has been there 9 months but they have dicharged her. She had the hospital bed and all supplies were through hospice and hospice was trying to take this back. [facility] called to let her know to not worry about the bed. They did need notes and prescription for the pump and mattress as to why she needed it. Also for the table that she uses needs prescription for this. Please call pts mother back as she needs to know what steps to take now

## 2019-02-25 IMAGING — CR DG CHEST 1V PORT
1 series · 1 of 1 positions shown · non-contrast
Comparison: None.

CLINICAL DATA: Tachycardia.  Unresponsive.

EXAM:
PORTABLE CHEST 1 VIEW

[portable]
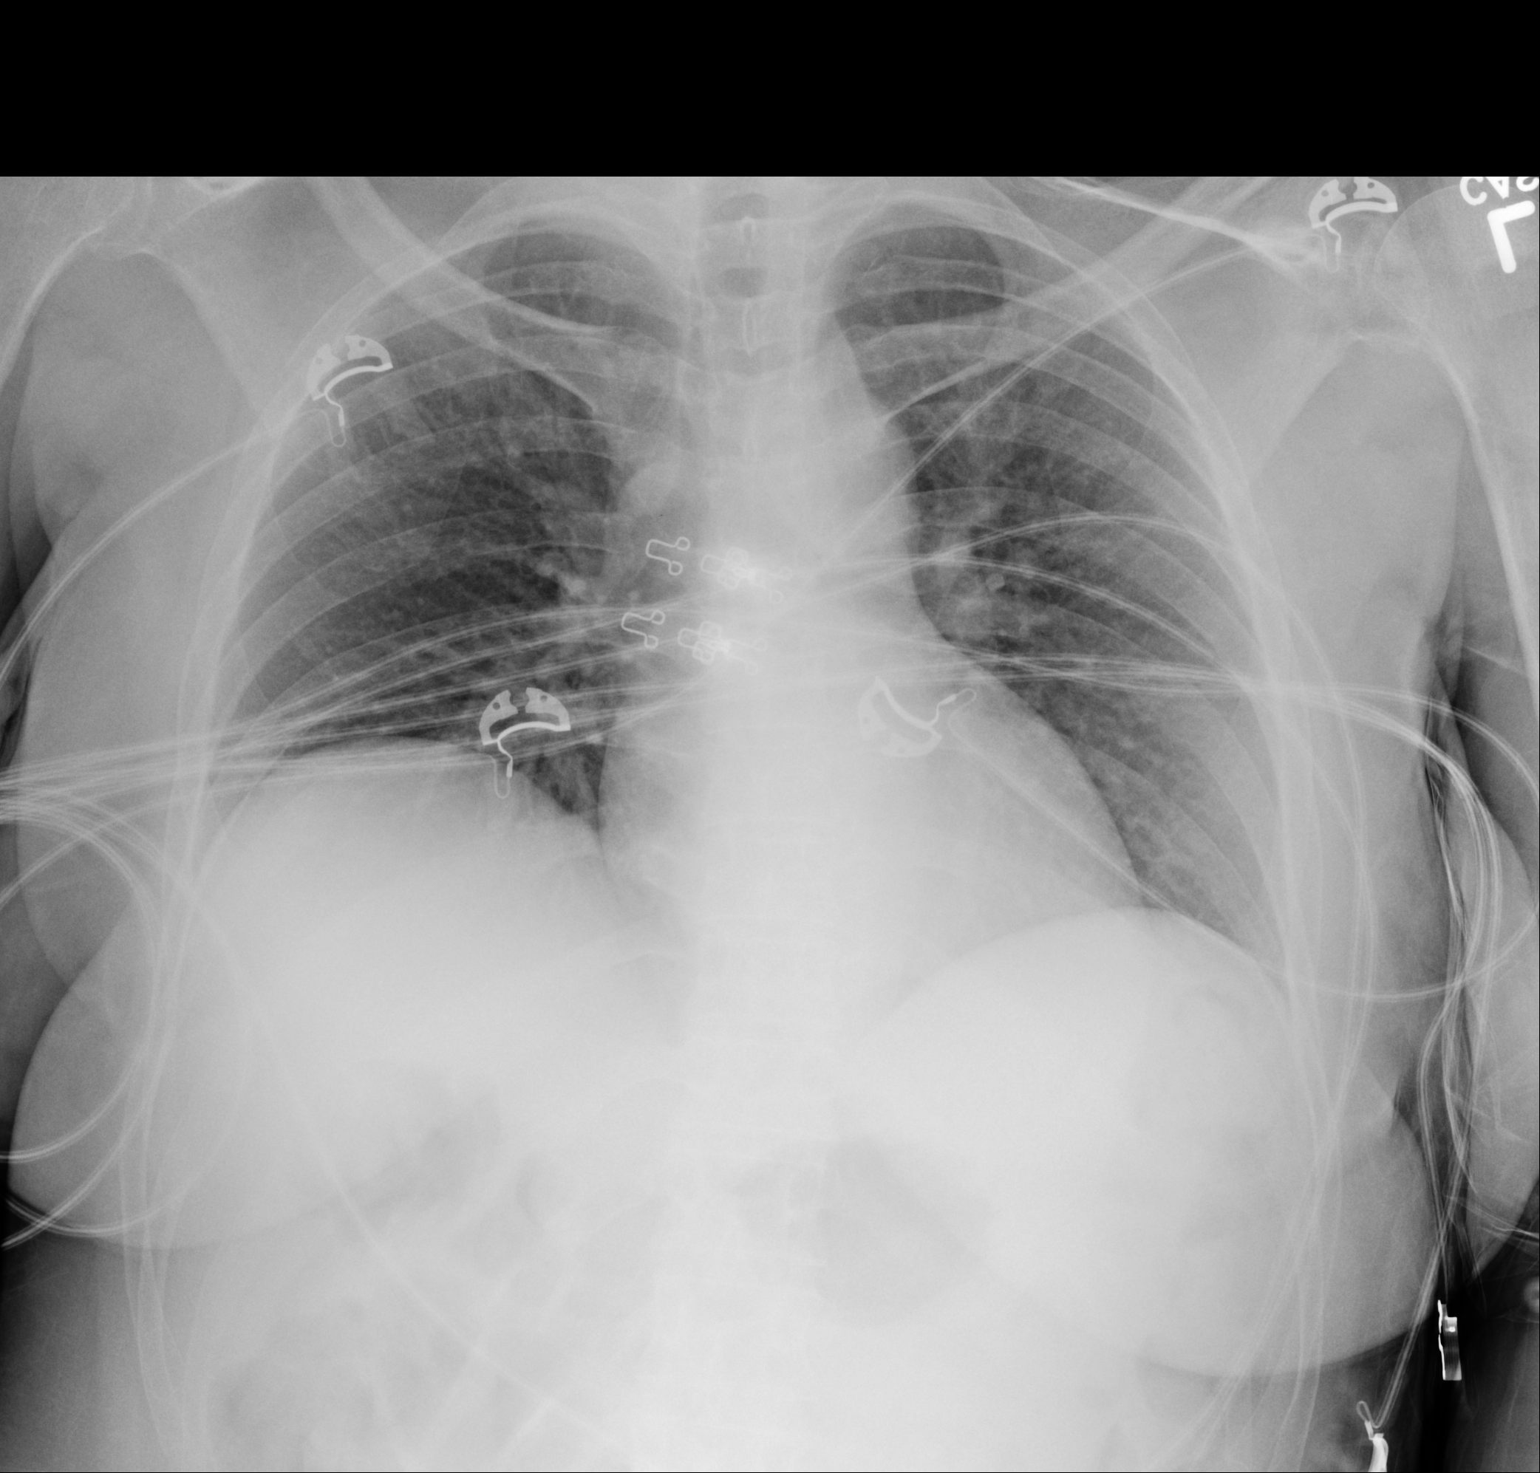

[1 of 1 positions shown; findings below may reference images not displayed]

FINDINGS: 7988 hours. Low volume film. The cardiopericardial silhouette is
within normal limits for size. The lungs are clear without focal
pneumonia, edema, pneumothorax or pleural effusion. The visualized
bony structures of the thorax are intact. Telemetry leads overlie
the chest.
IMPRESSION: Low volume film without acute cardiopulmonary findings.

## 2019-02-26 ENCOUNTER — Ambulatory Visit (INDEPENDENT_AMBULATORY_CARE_PROVIDER_SITE_OTHER): Payer: Medicare Other | Admitting: Family Medicine

## 2019-02-26 ENCOUNTER — Other Ambulatory Visit: Payer: Self-pay

## 2019-02-26 ENCOUNTER — Encounter: Payer: Self-pay | Admitting: Family Medicine

## 2019-02-26 DIAGNOSIS — F0280 Dementia in other diseases classified elsewhere without behavioral disturbance: Secondary | ICD-10-CM | POA: Diagnosis not present

## 2019-02-26 DIAGNOSIS — E559 Vitamin D deficiency, unspecified: Secondary | ICD-10-CM | POA: Diagnosis not present

## 2019-02-26 DIAGNOSIS — E785 Hyperlipidemia, unspecified: Secondary | ICD-10-CM | POA: Diagnosis not present

## 2019-02-26 DIAGNOSIS — I10 Essential (primary) hypertension: Secondary | ICD-10-CM | POA: Diagnosis not present

## 2019-02-26 DIAGNOSIS — K5900 Constipation, unspecified: Secondary | ICD-10-CM | POA: Diagnosis not present

## 2019-02-26 DIAGNOSIS — Z7401 Bed confinement status: Secondary | ICD-10-CM | POA: Diagnosis not present

## 2019-02-26 DIAGNOSIS — G10 Huntington's disease: Secondary | ICD-10-CM

## 2019-02-26 DIAGNOSIS — R32 Unspecified urinary incontinence: Secondary | ICD-10-CM | POA: Diagnosis not present

## 2019-02-26 DIAGNOSIS — R159 Full incontinence of feces: Secondary | ICD-10-CM | POA: Diagnosis not present

## 2019-02-26 DIAGNOSIS — R1319 Other dysphagia: Secondary | ICD-10-CM

## 2019-02-26 DIAGNOSIS — E119 Type 2 diabetes mellitus without complications: Secondary | ICD-10-CM | POA: Diagnosis not present

## 2019-02-26 DIAGNOSIS — R4701 Aphasia: Secondary | ICD-10-CM | POA: Diagnosis not present

## 2019-02-26 DIAGNOSIS — R1310 Dysphagia, unspecified: Secondary | ICD-10-CM | POA: Diagnosis not present

## 2019-02-26 DIAGNOSIS — F330 Major depressive disorder, recurrent, mild: Secondary | ICD-10-CM | POA: Diagnosis not present

## 2019-02-26 DIAGNOSIS — N920 Excessive and frequent menstruation with regular cycle: Secondary | ICD-10-CM | POA: Diagnosis not present

## 2019-02-26 MED ORDER — UNABLE TO FIND: 0 refills | Status: DC

## 2019-02-26 NOTE — Progress Notes (Signed)
Virtual Visit via Telephone Note   This visit type was conducted due to national recommendations for restrictions regarding the COVID-19 Pandemic (e.g. social distancing) in an effort to limit this patient's exposure and mitigate transmission in our community.  Due to her co-morbid illnesses, this patient is at least at moderate risk for complications without adequate follow up.  This format is felt to be most appropriate for this patient at this time.  The patient did not have access to video technology/had technical difficulties with video requiring transitioning to audio format only (telephone).  All issues noted in this document were discussed and addressed.  No physical exam could be performed with this format.   Evaluation Performed:  Follow-up visit  Date:  02/26/2019   ID:  Victoria, [name] 28-Jul-1970, MRN [id number]  Patient Location: Home Provider Location: Office  Location of Patient: Home Location of Provider: Telehealth Consent was obtain for visit to be over via telehealth. I verified that I am speaking with the correct person using two identifiers.  PCP:  Fayrene Helper, MD   Chief Complaint:  Home health needs, post d/c from hospice  History of Present Illness:    Victoria [name] is a 48 y.o. female with extensive history including diabetes, fibroids, heavy menses, Huntington's disease, dementia, bedridden, obesity among others.  Recently discharged from hospice in her spite care after 6 months.  Mother and aunt are the only ones to take care of her.  She is back at home and in need of a APP pop and over the bed table so that she can provide her with baths and help her with feeding.  Additionally needs home health so that she can get her Depo shot which is due on September 20 of this month.  As well as vital signs and skin breakdown checks.   As stated Victoria [name] is bed ridden, her mother does most of her care and is the communicator and historian today.  She  pures her food.  Needs a bedside table to help her with her feeding and bathing.  Victoria [name] is incontinent of stool and urine.  She does take her medications whole in applesauce.  Currently her mother denies having any pressure sores or wounds.  She wears depends and they have pads as well.  The patient does not have symptoms concerning for COVID-19 infection (fever, chills, cough, or new shortness of breath).   Past Medical, Surgical, Social History, Allergies, and Medications have been Reviewed.   Past Medical History:  Diagnosis Date  . Anxiety   . Diabetes mellitus   . Dyslipidemia   . Fibroids 06/10/2014  . Huntington's disease ([place])   . Hypertension   . Menorrhagia with regular cycle 06/02/2014  . Mild cognitive impairment   . Obesity   . Perennial allergic rhinitis   . Sleep apnea    cannot tolerate CPAP.   No past surgical history on file.   Current Meds  Medication Sig  . citalopram (CELEXA) 20 MG tablet TAKE 1 TABLET BY MOUTH DAILY  . Clobetasol Propionate (CLOBEX) 0.05 % shampoo Apply topically twice daily  . donepezil (ARICEPT) 10 MG tablet Take 10 mg by mouth at bedtime.  . ergocalciferol (VITAMIN D2) 1.25 MG (50000 UT) capsule Take 1 capsule (50,000 Units total) by mouth once a week. One capsule once weekly  . Incontinence Supply Disposable (UNDERPADS) MISC 30 per month  . lovastatin (MEVACOR) 20 MG tablet TAKE ONE TABLET (20MG  TOTAL) BY  MOUTH ATBEDTIME  . medroxyPROGESTERone (DEPO-PROVERA) 150 MG/ML injection Inject 1 mL (150 mg total) into the muscle every 3 (three) months.  . memantine (NAMENDA) 10 MG tablet Take 1 tablet (10 mg total) by mouth 2 (two) times daily.  . QUEtiapine (SEROQUEL) 50 MG tablet TAKE 1 TABLET BY MOUTH 3 TIMES DAILY.  Marland Kitchen risperiDONE (RISPERDAL) 1 MG tablet Take 1 mg by mouth 2 (two) times daily.  Marland Kitchen STARCH-MALTO DEXTRIN (THICK-IT) POWD 4 tsp per 6 ounces of liquid.  Marland Kitchen UNABLE TO FIND Gel pad cushion for wheelchair x 1   Dx Huntingtons disease   . UNABLE TO FIND Underpads  CAPS program  . UNABLE TO FIND Pull ups size large- CAPS program  . UNABLE TO FIND Ensure- 3 cans daily (chocolate or vanilla) CAP program  . UNABLE TO FIND Gloves/2 month CAP program  . UNABLE TO FIND Gel Pad overlay for electric hospital bed x 1  DX: Huntington's disease ICD 10 G10     Allergies:   Diphenhydramine hcl   Social History   Tobacco Use  . Smoking status: Never Smoker  . Smokeless tobacco: Never Used  Substance Use Topics  . Alcohol use: No  . Drug use: No     Family Hx: The patient's family history includes Alzheimer's disease in her maternal grandmother; Cancer in an other family member; Diabetes in her mother and another family member; Heart defect in an other family member; Huntington's disease in her brother and father; Hyperlipidemia in her mother; Hypertension in her mother.  ROS:   Please see the history of present illness.    All other systems reviewed and are negative.   Labs/Other Tests and Data Reviewed:     Recent Labs: 06/26/2018: ALT 6; BUN 12; Creat 0.89; Hemoglobin 14.3; Platelets 247; Potassium 3.9; Sodium 138; TSH 2.39   Recent Lipid Panel Lab Results  Component Value Date/Time   CHOL 167 08/31/2016 02:31 PM   TRIG 96 08/31/2016 02:31 PM   HDL 34 (L) 08/31/2016 02:31 PM   CHOLHDL 4.9 08/31/2016 02:31 PM   LDLCALC 114 (H) 08/31/2016 02:31 PM    Wt Readings from Last 3 Encounters:  10/24/18 140 lb (63.5 kg)  05/09/18 140 lb (63.5 kg)  12/28/17 142 lb (64.4 kg)     Objective:    Vital Signs:  There were no vitals taken for this visit.   Huntington's Disease Non verbal  ASSESSMENT & PLAN:    1. 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 Was on Hospice and Respite Care. But she stopped declining and has been recently d/c. Mother needs over the bed table to help with bathing and feeding, she needs pump for her mattress, and home  health to give her depo shots and check vitals. Orders placed today.  2. Other dysphagia Mother must puree foods and feed her, need over the bed table  3. Bedridden Needs pump for mattress and over the bed table  4. Menorrhagia with regular cycle Needs Depo injections- home health   Time:   Today, I have spent 10 minutes with the patient with telehealth technology discussing the above problems.     Medication Adjustments/Labs and Tests Ordered: Current medicines are reviewed at length with the patient today.  Concerns regarding medicines are outlined above.   Tests Ordered: No orders of the defined types were placed in this encounter.   Medication Changes: No orders of the defined types were placed in this encounter.  Disposition:  Follow up 03/07/2019  Signed, Perlie Mayo, NP  02/26/2019 11:41 AM     [place]

## 2019-02-26 NOTE — Patient Instructions (Signed)
    Thank you for completing your visit via telephone. I appreciate the opportunity to provide you with the care for your health and wellness. Today we discussed: Home health needs  Follow Up: 03/07/2019  No labs.  Referral for home health place today.  Order for over the bed table and APP mattress Pap ordered as well.  Please continue to practice social distancing to keep you, your family, and our community safe.  If you must go out, please wear a Mask and practice good handwashing.  [place] YOUR HANDS WELL AND FREQUENTLY. AVOID TOUCHING YOUR FACE, UNLESS YOUR HANDS ARE FRESHLY WASHED.  GET FRESH AIR DAILY. STAY HYDRATED WITH WATER.   It was a pleasure to see you and I look forward to continuing to work together on your health and well-being. Please do not hesitate to call the office if you need care or have questions about your care.  Have a wonderful day and week. With Gratitude, Cherly Beach, DNP, AGNP-BC

## 2019-03-04 ENCOUNTER — Telehealth: Payer: Self-pay | Admitting: Family Medicine

## 2019-03-04 NOTE — Telephone Encounter (Signed)
Victoria [name] w\amedysis is calling she states that we need to complete a face to face\ or virtual to start the process   I will reach out for an appt

## 2019-03-05 DIAGNOSIS — N920 Excessive and frequent menstruation with regular cycle: Secondary | ICD-10-CM | POA: Diagnosis not present

## 2019-03-05 DIAGNOSIS — E119 Type 2 diabetes mellitus without complications: Secondary | ICD-10-CM | POA: Diagnosis not present

## 2019-03-05 DIAGNOSIS — F0280 Dementia in other diseases classified elsewhere without behavioral disturbance: Secondary | ICD-10-CM | POA: Diagnosis not present

## 2019-03-05 DIAGNOSIS — R1319 Other dysphagia: Secondary | ICD-10-CM | POA: Diagnosis not present

## 2019-03-05 DIAGNOSIS — G10 Huntington's disease: Secondary | ICD-10-CM | POA: Diagnosis not present

## 2019-03-05 DIAGNOSIS — I10 Essential (primary) hypertension: Secondary | ICD-10-CM | POA: Diagnosis not present

## 2019-03-05 DIAGNOSIS — D259 Leiomyoma of uterus, unspecified: Secondary | ICD-10-CM | POA: Diagnosis not present

## 2019-03-05 DIAGNOSIS — G4739 Other sleep apnea: Secondary | ICD-10-CM | POA: Diagnosis not present

## 2019-03-05 DIAGNOSIS — E1140 Type 2 diabetes mellitus with diabetic neuropathy, unspecified: Secondary | ICD-10-CM | POA: Diagnosis not present

## 2019-03-05 DIAGNOSIS — E785 Hyperlipidemia, unspecified: Secondary | ICD-10-CM | POA: Diagnosis not present

## 2019-03-05 NOTE — Telephone Encounter (Signed)
Per Gwinda Passe- appt scheduled

## 2019-03-07 ENCOUNTER — Encounter: Payer: Self-pay | Admitting: Family Medicine

## 2019-03-07 ENCOUNTER — Ambulatory Visit (INDEPENDENT_AMBULATORY_CARE_PROVIDER_SITE_OTHER): Payer: Medicare Other | Admitting: Family Medicine

## 2019-03-07 ENCOUNTER — Other Ambulatory Visit: Payer: Self-pay

## 2019-03-07 VITALS — BP 118/80 | HR 100 | Resp 15 | Ht 64.0 in | Wt 140.0 lb

## 2019-03-07 DIAGNOSIS — I10 Essential (primary) hypertension: Secondary | ICD-10-CM | POA: Diagnosis not present

## 2019-03-07 DIAGNOSIS — R1319 Other dysphagia: Secondary | ICD-10-CM | POA: Diagnosis not present

## 2019-03-07 DIAGNOSIS — E119 Type 2 diabetes mellitus without complications: Secondary | ICD-10-CM | POA: Diagnosis not present

## 2019-03-07 DIAGNOSIS — N920 Excessive and frequent menstruation with regular cycle: Secondary | ICD-10-CM | POA: Diagnosis not present

## 2019-03-07 DIAGNOSIS — G10 Huntington's disease: Secondary | ICD-10-CM | POA: Diagnosis not present

## 2019-03-07 DIAGNOSIS — Z0000 Encounter for general adult medical examination without abnormal findings: Secondary | ICD-10-CM

## 2019-03-07 DIAGNOSIS — F0280 Dementia in other diseases classified elsewhere without behavioral disturbance: Secondary | ICD-10-CM | POA: Diagnosis not present

## 2019-03-07 MED ORDER — LOVASTATIN 20 MG PO TABS: ORAL_TABLET | ORAL | 1 refills | Status: DC

## 2019-03-07 NOTE — Progress Notes (Signed)
Subjective:   Victoria [name] is a 48 y.o. female who presents for Medicare Annual (Subsequent) preventive examination.  Location of Patient: Home Location of Provider: Telehealth Consent was obtain for visit to be over via telehealth. I verified that I am speaking with the correct person using two identifiers.  Mother is historian secondary to Huntington's   Review of Systems:    Cardiac Risk Factors include: dyslipidemia     Objective:     Vitals: BP 118/80   Pulse 100   Resp 15   Ht 5\' 4"  (1.626 m)   Wt 140 lb (63.5 kg)   BMI 24.03 kg/m   Body mass index is 24.03 kg/m.  Advanced Directives 03/05/2018 04/12/2017 01/16/2017 06/22/2016 08/19/2015 09/02/2014 08/29/2014  Does Patient Have a Medical Advance Directive? No No No No No - Yes  Type of Advance Directive [place] (No Data) Therapist, sports in Chart? - - - - - - No - copy requested  Would patient like information on creating a medical advance directive? - No - Patient declined Yes (MAU/Ambulatory/Procedural Areas - Information given) No - Patient declined Yes Higher education careers adviser given - -    Tobacco Social History   Tobacco Use  Smoking Status Never Smoker  Smokeless Tobacco Never Used     Counseling given: Yes   Clinical Intake:  Pre-visit preparation completed: Yes  Pain : No/denies pain Pain Score: 0-No pain     BMI - recorded: 24.03 Nutritional Status: BMI of 19-24  Normal Nutritional Risks: None Diabetes: No  How often do you need to have someone help you when you read instructions, pamphlets, or other written materials from your doctor or pharmacy?: 5 - Always What is the last grade level you completed in school?: 12  Interpreter Needed?: No     Past Medical History:  Diagnosis Date  . Anxiety   . Diabetes mellitus   . Dyslipidemia   . Fibroids 06/10/2014  . Huntington's disease ([place])   . Hypertension   .  Menorrhagia with regular cycle 06/02/2014  . Mild cognitive impairment   . Obesity   . Perennial allergic rhinitis   . Sleep apnea    cannot tolerate CPAP.   History reviewed. No pertinent surgical history. Family History  Problem Relation Age of Onset  . Huntington's disease Father   . Diabetes Mother   . Hypertension Mother   . Hyperlipidemia Mother   . Huntington's disease Brother   . Alzheimer's disease Maternal Grandmother   . Cancer Other        family history   . Diabetes Other        family history   . Heart defect Other        family history    Social History   Socioeconomic History  . Marital status: Divorced    Spouse name: Not on file  . Number of children: 1  . Years of education: Not on file  . Highest education level: Not on file  Occupational History  . Occupation: disabled   Social Needs  . Financial resource strain: Not on file  . Food insecurity    Worry: Not on file    Inability: Not on file  . Transportation needs    Medical: Not on file    Non-medical: Not on file  Tobacco Use  . Smoking status: Never Smoker  . Smokeless tobacco: Never Used  Substance and Sexual Activity  . Alcohol use: No  . Drug use: No  . Sexual activity: Never    Birth control/protection: Injection  Lifestyle  . Physical activity    Days per week: Not on file    Minutes per session: Not on file  . Stress: Not on file  Relationships  . Social Herbalist on phone: Not on file    Gets together: Not on file    Attends religious service: Not on file    Active member of club or organization: Not on file    Attends meetings of clubs or organizations: Not on file    Relationship status: Not on file  Other Topics Concern  . Not on file  Social History Narrative   Lives with mom   Drinks 1 cup of coffee a day    Outpatient Encounter Medications as of 03/07/2019  Medication Sig  . citalopram (CELEXA) 20 MG tablet TAKE 1 TABLET BY MOUTH DAILY  . Clobetasol  Propionate (CLOBEX) 0.05 % shampoo Apply topically twice daily  . donepezil (ARICEPT) 10 MG tablet Take 10 mg by mouth at bedtime.  . ergocalciferol (VITAMIN D2) 1.25 MG (50000 UT) capsule Take 1 capsule (50,000 Units total) by mouth once a week. One capsule once weekly  . Incontinence Supply Disposable (UNDERPADS) MISC 30 per month  . lovastatin (MEVACOR) 20 MG tablet TAKE ONE TABLET (20MG  TOTAL) BY MOUTH ATBEDTIME  . medroxyPROGESTERone (DEPO-PROVERA) 150 MG/ML injection Inject 1 mL (150 mg total) into the muscle every 3 (three) months.  . memantine (NAMENDA) 10 MG tablet Take 1 tablet (10 mg total) by mouth 2 (two) times daily.  . QUEtiapine (SEROQUEL) 50 MG tablet TAKE 1 TABLET BY MOUTH 3 TIMES DAILY.  Marland Kitchen risperiDONE (RISPERDAL) 1 MG tablet Take 1 mg by mouth 2 (two) times daily.  Marland Kitchen STARCH-MALTO DEXTRIN (THICK-IT) POWD 4 tsp per 6 ounces of liquid.  Marland Kitchen UNABLE TO FIND Gel pad cushion for wheelchair x 1   Dx Huntingtons disease  . UNABLE TO FIND Underpads  CAPS program  . UNABLE TO FIND Pull ups size large- CAPS program  . UNABLE TO FIND Ensure- 3 cans daily (chocolate or vanilla) CAP program  . UNABLE TO FIND Gloves/2 month CAP program  . UNABLE TO FIND Gel Pad overlay for electric hospital bed x 1  DX: Huntington's disease ICD 10 G10  . UNABLE TO FIND APP pump for mattress And an Over the bed table   No facility-administered encounter medications on file as of 03/07/2019.     Activities of Daily Living In your present state of health, do you have any difficulty performing the following activities: 03/07/2019  Hearing? N  Vision? N  Difficulty concentrating or making decisions? Y  Walking or climbing stairs? Y  Dressing or bathing? Y  Doing errands, shopping? Y  Preparing Food and eating ? Y  Using the Toilet? Y  In the past six months, have you accidently leaked urine? Y  Do you have problems with loss of bowel control? Y  Managing your Medications? Y  Managing your Finances? Y   Housekeeping or managing your Housekeeping? Y  Some recent data might be hidden    Patient Care Team: Fayrene Helper, MD as PCP - General [name], MD as Consulting Physician (Obstetrics and Gynecology) Danie Binder, MD as Consulting Physician (Gastroenterology) Phillips Odor, MD as Consulting Physician (Neurology)    Assessment:   This is a routine  wellness examination for Victoria [name].  Exercise Activities and Dietary recommendations Current Exercise Habits: The patient does not participate in regular exercise at present, Exercise limited by: neurologic condition(s)  Goals   None     Fall Risk Fall Risk  03/07/2019 02/26/2019 10/24/2018 06/26/2018 06/22/2018  Falls in the past year? 0 Exclusion - non ambulatory 0 0 Exclusion - non ambulatory  Number falls in past yr: 0 - 0 0 -  Injury with Fall? 0 - 0 0 -  Risk Factor Category  - - - - -  Risk for fall due to : - - - - -  Follow up - - - - -   Is the patient's home free of loose throw rugs in walkways, pet beds, electrical cords, etc?   yes      Grab bars in the bathroom? yes      Handrails on the stairs?   yes      Adequate lighting?   yes     Depression Screen PHQ 2/9 Scores 03/07/2019 02/26/2019 06/26/2018 03/05/2018  PHQ - 2 Score 0 0 1 6  PHQ- 9 Score - - - 16  Exception Documentation - - - -     Cognitive Function MMSE - Mini Mental State Exam 01/16/2017  Not completed: Unable to complete     6CIT Screen 03/05/2018  What Year? 4 points  What month? 3 points  What time? 3 points  Count back from 20 4 points  Months in reverse 4 points  Repeat phrase 10 points  Total Score 28    Immunization History  Administered Date(s) Administered  . Influenza Split 04/27/2011, 05/01/2012  . Influenza Whole 03/22/2007, 04/21/2009, 03/04/2010  . Influenza,inj,Quad PF,6+ Mos 03/19/2013, 05/08/2014, 04/14/2015, 05/04/2016, 07/03/2017, 03/05/2018  . Pneumococcal Conjugate-13 08/26/2014  . Pneumococcal Polysaccharide-23  04/21/2009  . Td 01/17/2007  . Tdap 08/25/2011    Qualifies for Shingles Vaccine? n/a  Screening Tests Health Maintenance  Topic Date Due  . PAP SMEAR-Modifier  04/26/2014  . URINE MICROALBUMIN  05/07/2015  . INFLUENZA VACCINE  01/19/2019  . HEMOGLOBIN A1C  12/31/2019 (Originally 03/03/2017)  . FOOT EXAM  01/08/2020 (Originally 03/21/2016)  . TETANUS/TDAP  08/24/2021  . PNEUMOCOCCAL POLYSACCHARIDE VACCINE AGE 44-64 HIGH RISK  Completed  . HIV Screening  Completed  . OPHTHALMOLOGY EXAM  Discontinued    Cancer Screenings: Lung: Low Dose CT Chest recommended if Age 105-80 years, 30 pack-year currently smoking OR have quit w/in 15years. Patient does not qualify. Breast:  Up to date on Mammogram? n/a  Up to date of Bone Density/Dexa? n/a Colorectal:  n/a  Additional Screenings:   Hepatitis C Screening:  n/a     Plan:     1. Encounter for Medicare annual wellness exam   I have personally reviewed and noted the following in the patient's chart:   . Medical and social history . Use of alcohol, tobacco or illicit drugs  . Current medications and supplements . Functional ability and status . Nutritional status . Physical activity . Advanced directives . List of other physicians . Hospitalizations, surgeries, and ER visits in previous 12 months . Vitals . Screenings to include cognitive, depression, and falls . Referrals and appointments  In addition, I have reviewed and discussed with patient certain preventive protocols, quality metrics, and best practice recommendations. A written personalized care plan for preventive services as well as general preventive health recommendations were provided to patient.   I provided 20 minutes of non-face-to-face time during this encounter.  Perlie Mayo, NP  03/07/2019

## 2019-03-07 NOTE — Patient Instructions (Signed)
Victoria [name] , Thank you for taking time to come for your Medicare Wellness Visit. I appreciate your ongoing commitment to your health goals. Please review the following plan we discussed and let me know if I can assist you in the future.   Please continue to practice social distancing to keep you, your family, and our community safe.  If you must go out, please wear a Mask and practice good handwashing.  Next appointment: 03/13/2019   Preventive Care 40-64 Years, Female Preventive care refers to lifestyle choices and visits with your health care provider that can promote health and wellness. What does preventive care include?  A yearly physical exam. This is also called an annual well check.  Dental exams once or twice a year.  Routine eye exams. Ask your health care provider how often you should have your eyes checked.  Personal lifestyle choices, including:  Daily care of your teeth and gums.  Regular physical activity.  Eating a healthy diet.  Avoiding tobacco and drug use.  Limiting alcohol use.  Practicing safe sex.  Taking low-dose aspirin daily starting at age 59.  Taking vitamin and mineral supplements as recommended by your health care provider. What happens during an annual well check? The services and screenings done by your health care provider during your annual well check will depend on your age, overall health, lifestyle risk factors, and family history of disease. Counseling  Your health care provider may ask you questions about your:  Alcohol use.  Tobacco use.  Drug use.  Emotional well-being.  Home and relationship well-being.  Sexual activity.  Eating habits.  Work and work Statistician.  Method of birth control.  Menstrual cycle.  Pregnancy history. Screening  You may have the following tests or measurements:  Height, weight, and BMI.  Blood pressure.  Lipid and cholesterol levels. These may be checked every 5 years, or more  frequently if you are over 40 years old.  Skin check.  Lung cancer screening. You may have this screening every year starting at age 84 if you have a 30-pack-year history of smoking and currently smoke or have quit within the past 15 years.  Fecal occult blood test (FOBT) of the stool. You may have this test every year starting at age 60.  Flexible sigmoidoscopy or colonoscopy. You may have a sigmoidoscopy every 5 years or a colonoscopy every 10 years starting at age 63.  Hepatitis C blood test.  Hepatitis B blood test.  Sexually transmitted disease (STD) testing.  Diabetes screening. This is done by checking your blood sugar (glucose) after you have not eaten for a while (fasting). You may have this done every 1-3 years.  Mammogram. This may be done every 1-2 years. Talk to your health care provider about when you should start having regular mammograms. This may depend on whether you have a family history of breast cancer.  BRCA-related cancer screening. This may be done if you have a family history of breast, ovarian, tubal, or peritoneal cancers.  Pelvic exam and Pap test. This may be done every 3 years starting at age 41. Starting at age 26, this may be done every 5 years if you have a Pap test in combination with an HPV test.  Bone density scan. This is done to screen for osteoporosis. You may have this scan if you are at high risk for osteoporosis. Discuss your test results, treatment options, and if necessary, the need for more tests with your health care provider. Vaccines  Your health care provider may recommend certain vaccines, such as:  Influenza vaccine. This is recommended every year.  Tetanus, diphtheria, and acellular pertussis (Tdap, Td) vaccine. You may need a Td booster every 10 years.  Zoster vaccine. You may need this after age 2.  Pneumococcal 13-valent conjugate (PCV13) vaccine. You may need this if you have certain conditions and were not previously  vaccinated.  Pneumococcal polysaccharide (PPSV23) vaccine. You may need one or two doses if you smoke cigarettes or if you have certain conditions. Talk to your health care provider about which screenings and vaccines you need and how often you need them. This information is not intended to replace advice given to you by your health care provider. Make sure you discuss any questions you have with your health care provider. Document Released: 07/03/2015 Document Revised: 02/24/2016 Document Reviewed: 04/07/2015 Elsevier Interactive Patient Education  2017 [place] Prevention in the Home Falls can cause injuries. They can happen to people of all ages. There are many things you can do to make your home safe and to help prevent falls. What can I do on the outside of my home?  Regularly fix the edges of walkways and driveways and fix any cracks.  Remove anything that might make you trip as you walk through a door, such as a raised step or threshold.  Trim any bushes or trees on the path to your home.  Use bright outdoor lighting.  Clear any walking paths of anything that might make someone trip, such as rocks or tools.  Regularly check to see if handrails are loose or broken. Make sure that both sides of any steps have handrails.  Any raised decks and porches should have guardrails on the edges.  Have any leaves, snow, or ice cleared regularly.  Use sand or salt on walking paths during winter.  Clean up any spills in your garage right away. This includes oil or grease spills. What can I do in the bathroom?  Use night lights.  Install grab bars by the toilet and in the tub and shower. Do not use towel bars as grab bars.  Use non-skid mats or decals in the tub or shower.  If you need to sit down in the shower, use a plastic, non-slip stool.  Keep the floor dry. Clean up any water that spills on the floor as soon as it happens.  Remove soap buildup in the tub or  shower regularly.  Attach bath mats securely with double-sided non-slip rug tape.  Do not have throw rugs and other things on the floor that can make you trip. What can I do in the bedroom?  Use night lights.  Make sure that you have a light by your bed that is easy to reach.  Do not use any sheets or blankets that are too big for your bed. They should not hang down onto the floor.  Have a firm chair that has side arms. You can use this for support while you get dressed.  Do not have throw rugs and other things on the floor that can make you trip. What can I do in the kitchen?  Clean up any spills right away.  Avoid walking on wet floors.  Keep items that you use a lot in easy-to-reach places.  If you need to reach something above you, use a strong step stool that has a grab bar.  Keep electrical cords out of the way.  Do not use  floor polish or wax that makes floors slippery. If you must use wax, use non-skid floor wax.  Do not have throw rugs and other things on the floor that can make you trip. What can I do with my stairs?  Do not leave any items on the stairs.  Make sure that there are handrails on both sides of the stairs and use them. Fix handrails that are broken or loose. Make sure that handrails are as long as the stairways.  Check any carpeting to make sure that it is firmly attached to the stairs. Fix any carpet that is loose or worn.  Avoid having throw rugs at the top or bottom of the stairs. If you do have throw rugs, attach them to the floor with carpet tape.  Make sure that you have a light switch at the top of the stairs and the bottom of the stairs. If you do not have them, ask someone to add them for you. What else can I do to help prevent falls?  Wear shoes that:  Do not have high heels.  Have rubber bottoms.  Are comfortable and fit you well.  Are closed at the toe. Do not wear sandals.  If you use a stepladder:  Make sure that it is fully  opened. Do not climb a closed stepladder.  Make sure that both sides of the stepladder are locked into place.  Ask someone to hold it for you, if possible.  Clearly mark and make sure that you can see:  Any grab bars or handrails.  First and last steps.  Where the edge of each step is.  Use tools that help you move around (mobility aids) if they are needed. These include:  Canes.  Walkers.  Scooters.  Crutches.  Turn on the lights when you go into a dark area. Replace any light bulbs as soon as they burn out.  Set up your furniture so you have a clear path. Avoid moving your furniture around.  If any of your floors are uneven, fix them.  If there are any pets around you, be aware of where they are.  Review your medicines with your doctor. Some medicines can make you feel dizzy. This can increase your chance of falling. Ask your doctor what other things that you can do to help prevent falls. This information is not intended to replace advice given to you by your health care provider. Make sure you discuss any questions you have with your health care provider. Document Released: 04/02/2009 Document Revised: 11/12/2015 Document Reviewed: 07/11/2014 Elsevier Interactive Patient Education  2017 [employer].

## 2019-03-08 DIAGNOSIS — G10 Huntington's disease: Secondary | ICD-10-CM | POA: Diagnosis not present

## 2019-03-08 DIAGNOSIS — E119 Type 2 diabetes mellitus without complications: Secondary | ICD-10-CM | POA: Diagnosis not present

## 2019-03-08 DIAGNOSIS — R1319 Other dysphagia: Secondary | ICD-10-CM | POA: Diagnosis not present

## 2019-03-08 DIAGNOSIS — F0280 Dementia in other diseases classified elsewhere without behavioral disturbance: Secondary | ICD-10-CM | POA: Diagnosis not present

## 2019-03-08 DIAGNOSIS — I10 Essential (primary) hypertension: Secondary | ICD-10-CM | POA: Diagnosis not present

## 2019-03-08 DIAGNOSIS — N920 Excessive and frequent menstruation with regular cycle: Secondary | ICD-10-CM | POA: Diagnosis not present

## 2019-03-12 DIAGNOSIS — R1319 Other dysphagia: Secondary | ICD-10-CM | POA: Diagnosis not present

## 2019-03-12 DIAGNOSIS — N920 Excessive and frequent menstruation with regular cycle: Secondary | ICD-10-CM | POA: Diagnosis not present

## 2019-03-12 DIAGNOSIS — I10 Essential (primary) hypertension: Secondary | ICD-10-CM | POA: Diagnosis not present

## 2019-03-12 DIAGNOSIS — E119 Type 2 diabetes mellitus without complications: Secondary | ICD-10-CM | POA: Diagnosis not present

## 2019-03-12 DIAGNOSIS — F0280 Dementia in other diseases classified elsewhere without behavioral disturbance: Secondary | ICD-10-CM | POA: Diagnosis not present

## 2019-03-12 DIAGNOSIS — G10 Huntington's disease: Secondary | ICD-10-CM | POA: Diagnosis not present

## 2019-03-13 ENCOUNTER — Ambulatory Visit: Payer: Medicare Other | Admitting: Family Medicine

## 2019-03-13 ENCOUNTER — Other Ambulatory Visit: Payer: Self-pay

## 2019-03-13 DIAGNOSIS — G4739 Other sleep apnea: Secondary | ICD-10-CM

## 2019-03-13 DIAGNOSIS — I10 Essential (primary) hypertension: Secondary | ICD-10-CM

## 2019-03-13 DIAGNOSIS — F0280 Dementia in other diseases classified elsewhere without behavioral disturbance: Secondary | ICD-10-CM | POA: Diagnosis not present

## 2019-03-13 DIAGNOSIS — N920 Excessive and frequent menstruation with regular cycle: Secondary | ICD-10-CM | POA: Diagnosis not present

## 2019-03-13 DIAGNOSIS — E119 Type 2 diabetes mellitus without complications: Secondary | ICD-10-CM | POA: Diagnosis not present

## 2019-03-13 DIAGNOSIS — E1140 Type 2 diabetes mellitus with diabetic neuropathy, unspecified: Secondary | ICD-10-CM | POA: Diagnosis not present

## 2019-03-13 DIAGNOSIS — G10 Huntington's disease: Secondary | ICD-10-CM | POA: Diagnosis not present

## 2019-03-13 DIAGNOSIS — R1319 Other dysphagia: Secondary | ICD-10-CM | POA: Diagnosis not present

## 2019-03-13 DIAGNOSIS — E785 Hyperlipidemia, unspecified: Secondary | ICD-10-CM

## 2019-03-13 DIAGNOSIS — D259 Leiomyoma of uterus, unspecified: Secondary | ICD-10-CM | POA: Diagnosis not present

## 2019-03-13 NOTE — Telephone Encounter (Signed)
I called Santiago Glad and advised that the pt was not coming in ,and did not have a way to do a virtual. Santiago Glad is going to have her Nurse Star go out to do a virtual visit at 63 or 1

## 2019-03-14 DIAGNOSIS — N920 Excessive and frequent menstruation with regular cycle: Secondary | ICD-10-CM | POA: Diagnosis not present

## 2019-03-14 DIAGNOSIS — G10 Huntington's disease: Secondary | ICD-10-CM | POA: Diagnosis not present

## 2019-03-14 DIAGNOSIS — F0280 Dementia in other diseases classified elsewhere without behavioral disturbance: Secondary | ICD-10-CM | POA: Diagnosis not present

## 2019-03-14 DIAGNOSIS — E119 Type 2 diabetes mellitus without complications: Secondary | ICD-10-CM | POA: Diagnosis not present

## 2019-03-14 DIAGNOSIS — I10 Essential (primary) hypertension: Secondary | ICD-10-CM | POA: Diagnosis not present

## 2019-03-14 DIAGNOSIS — R1319 Other dysphagia: Secondary | ICD-10-CM | POA: Diagnosis not present

## 2019-03-15 DIAGNOSIS — R1319 Other dysphagia: Secondary | ICD-10-CM | POA: Diagnosis not present

## 2019-03-15 DIAGNOSIS — E119 Type 2 diabetes mellitus without complications: Secondary | ICD-10-CM | POA: Diagnosis not present

## 2019-03-15 DIAGNOSIS — N920 Excessive and frequent menstruation with regular cycle: Secondary | ICD-10-CM | POA: Diagnosis not present

## 2019-03-15 DIAGNOSIS — I10 Essential (primary) hypertension: Secondary | ICD-10-CM | POA: Diagnosis not present

## 2019-03-15 DIAGNOSIS — F0280 Dementia in other diseases classified elsewhere without behavioral disturbance: Secondary | ICD-10-CM | POA: Diagnosis not present

## 2019-03-15 DIAGNOSIS — G10 Huntington's disease: Secondary | ICD-10-CM | POA: Diagnosis not present

## 2019-03-19 ENCOUNTER — Other Ambulatory Visit: Payer: Self-pay

## 2019-03-19 ENCOUNTER — Telehealth: Payer: Self-pay

## 2019-03-19 ENCOUNTER — Telehealth (INDEPENDENT_AMBULATORY_CARE_PROVIDER_SITE_OTHER): Payer: Medicare Other | Admitting: Family Medicine

## 2019-03-19 VITALS — BP 102/64 | HR 70 | Temp 97.2°F | Resp 16

## 2019-03-19 DIAGNOSIS — F0280 Dementia in other diseases classified elsewhere without behavioral disturbance: Secondary | ICD-10-CM | POA: Diagnosis not present

## 2019-03-19 DIAGNOSIS — I10 Essential (primary) hypertension: Secondary | ICD-10-CM | POA: Diagnosis not present

## 2019-03-19 DIAGNOSIS — E119 Type 2 diabetes mellitus without complications: Secondary | ICD-10-CM | POA: Diagnosis not present

## 2019-03-19 DIAGNOSIS — M6281 Muscle weakness (generalized): Secondary | ICD-10-CM

## 2019-03-19 DIAGNOSIS — R1319 Other dysphagia: Secondary | ICD-10-CM | POA: Diagnosis not present

## 2019-03-19 DIAGNOSIS — R279 Unspecified lack of coordination: Secondary | ICD-10-CM | POA: Diagnosis not present

## 2019-03-19 DIAGNOSIS — G10 Huntington's disease: Secondary | ICD-10-CM

## 2019-03-19 DIAGNOSIS — N920 Excessive and frequent menstruation with regular cycle: Secondary | ICD-10-CM | POA: Diagnosis not present

## 2019-03-19 MED ORDER — LOVASTATIN 20 MG PO TABS: ORAL_TABLET | ORAL | 1 refills | Status: DC

## 2019-03-19 NOTE — Telephone Encounter (Signed)
States when patient was in hospice she was giving pt lorazepam and it really calmed her down and helped with he restlessness at night and helped her sleep. There is no refills left and wanted to know if she could get it sent to [place]

## 2019-03-19 NOTE — Patient Instructions (Signed)
Follow up virtual visit in 4 months, call if you need me sooner  Nurse to coordinate flu vaccine, obtain and give H/H to administer, contact [phone], Starlena   Will look into reclining wheelchair and be in touch

## 2019-03-20 ENCOUNTER — Other Ambulatory Visit: Payer: Self-pay | Admitting: Family Medicine

## 2019-03-20 DIAGNOSIS — G10 Huntington's disease: Secondary | ICD-10-CM | POA: Diagnosis not present

## 2019-03-20 DIAGNOSIS — I10 Essential (primary) hypertension: Secondary | ICD-10-CM | POA: Diagnosis not present

## 2019-03-20 DIAGNOSIS — F0280 Dementia in other diseases classified elsewhere without behavioral disturbance: Secondary | ICD-10-CM | POA: Diagnosis not present

## 2019-03-20 DIAGNOSIS — N920 Excessive and frequent menstruation with regular cycle: Secondary | ICD-10-CM | POA: Diagnosis not present

## 2019-03-20 DIAGNOSIS — E119 Type 2 diabetes mellitus without complications: Secondary | ICD-10-CM | POA: Diagnosis not present

## 2019-03-20 DIAGNOSIS — R1319 Other dysphagia: Secondary | ICD-10-CM | POA: Diagnosis not present

## 2019-03-20 MED ORDER — LORAZEPAM 0.5 MG PO TABS: 0.5000 mg | ORAL_TABLET | Freq: Two times a day (BID) | ORAL | 2 refills | Status: AC

## 2019-03-20 NOTE — Progress Notes (Addendum)
Virtual Visit via Telephone Note  I connected with Victoria [name] on 03/20/19 at  2:20 PM EDT by video visit and verified that I aam speaking with the correct person using two identifiers  Location: Patient:home Provider: office   I discussed the limitations, risks, security and privacy concerns of performing an evaluation and management service by telephone and the availability of in person appointments. I also discussed with the patient that there may be a patient responsible charge related to this service. The patient expressed understanding and agreed to proceed.   History of Present Illness: History from mother and  nurse Face to face video exam to evaluate for standing wheelchair, need for flu vaccine and f/u chronic problems Overall doing fairly well. Able to swallow with adequate soft food intake , no skin breakdown Unable to walk and support herself, family requests reclinig wheelchair so she can be  moved from bed to other rooms Denies recent fever or chills. Denies sinus pressure, nasal congestion, ear pain or sore throat. Denies chest congestion, productive cough or wheezing. Denies leg swelling Denies , vomiting,diarrhea or constipation.   Denies malodorous urine. Chronic immobility with spastic paraplegia  Denies skin break down or rash.       Observations/Objective: BP 102/64   Pulse 70   Temp (!) 97.2 F (36.2 C) (Oral)   Resp 16   SpO2 99%  Pt gazing toward camera with flat affect  Assessment and Plan:  Lack of coordination Need forreclining wheelchair justified so that pt can be moved out of her bed and into another room  Muscle weakness (generalized) Incapable of independent movement and has loss of power in upper extremities also, needs 24 hour total cae well provided by her Mother  HUNTINGTON'S DISEASE Progressive deterioration, pt immobile, has laid in her bed for  Months, is lifted for change in position, standing whelchair will improve her  quality of life   Follow Up Instructions:    I discussed the assessment and treatment plan with the patient. The patient was provided an opportunity to ask questions and all were answered. The patient agreed with the plan and demonstrated an understanding of the instructions.   The patient was advised to call back or seek an in-person evaluation if the symptoms worsen or if the condition fails to improve as anticipated.  I provided 25 minutes of face-to-face time during this encounter.   Tula Nakayama, MD

## 2019-03-20 NOTE — Telephone Encounter (Signed)
Working on getting a reclining medical chair for patient through [facility]

## 2019-03-20 NOTE — Telephone Encounter (Signed)
pls let her know this has been prescribed at one tablet twice daily first fill is 03/23/2019, thanks

## 2019-03-21 ENCOUNTER — Encounter: Payer: Self-pay | Admitting: Family Medicine

## 2019-03-21 NOTE — Assessment & Plan Note (Signed)
Progressive deterioration, pt immobile, has laid in her bed for  Months, is lifted for change in position, standing whelchair will improve her quality of life

## 2019-03-21 NOTE — Telephone Encounter (Signed)
Called back to let her know and phone busy

## 2019-03-21 NOTE — Assessment & Plan Note (Signed)
Need forreclining wheelchair justified so that pt can be moved out of her bed and into another room

## 2019-03-21 NOTE — Assessment & Plan Note (Signed)
Incapable of independent movement and has loss of power in upper extremities also, needs 24 hour total cae well provided by her Mother

## 2019-03-22 ENCOUNTER — Telehealth: Payer: Self-pay | Admitting: *Deleted

## 2019-03-22 DIAGNOSIS — F0280 Dementia in other diseases classified elsewhere without behavioral disturbance: Secondary | ICD-10-CM | POA: Diagnosis not present

## 2019-03-22 DIAGNOSIS — E119 Type 2 diabetes mellitus without complications: Secondary | ICD-10-CM | POA: Diagnosis not present

## 2019-03-22 DIAGNOSIS — R1319 Other dysphagia: Secondary | ICD-10-CM | POA: Diagnosis not present

## 2019-03-22 DIAGNOSIS — G10 Huntington's disease: Secondary | ICD-10-CM | POA: Diagnosis not present

## 2019-03-22 DIAGNOSIS — I10 Essential (primary) hypertension: Secondary | ICD-10-CM | POA: Diagnosis not present

## 2019-03-22 DIAGNOSIS — N920 Excessive and frequent menstruation with regular cycle: Secondary | ICD-10-CM | POA: Diagnosis not present

## 2019-03-22 NOTE — Telephone Encounter (Signed)
Home health nurse called back to let us know that [place] did not have a fever and she had just received her flu shot

## 2019-03-22 NOTE — Telephone Encounter (Signed)
Chart updated to reflect patient had flu shot

## 2019-03-26 ENCOUNTER — Other Ambulatory Visit: Payer: Self-pay | Admitting: Family Medicine

## 2019-03-26 DIAGNOSIS — E119 Type 2 diabetes mellitus without complications: Secondary | ICD-10-CM | POA: Diagnosis not present

## 2019-03-26 DIAGNOSIS — N920 Excessive and frequent menstruation with regular cycle: Secondary | ICD-10-CM | POA: Diagnosis not present

## 2019-03-26 DIAGNOSIS — I10 Essential (primary) hypertension: Secondary | ICD-10-CM | POA: Diagnosis not present

## 2019-03-26 DIAGNOSIS — F0280 Dementia in other diseases classified elsewhere without behavioral disturbance: Secondary | ICD-10-CM | POA: Diagnosis not present

## 2019-03-26 DIAGNOSIS — R1319 Other dysphagia: Secondary | ICD-10-CM | POA: Diagnosis not present

## 2019-03-26 DIAGNOSIS — G10 Huntington's disease: Secondary | ICD-10-CM | POA: Diagnosis not present

## 2019-03-27 DIAGNOSIS — F0280 Dementia in other diseases classified elsewhere without behavioral disturbance: Secondary | ICD-10-CM | POA: Diagnosis not present

## 2019-03-27 DIAGNOSIS — R1319 Other dysphagia: Secondary | ICD-10-CM | POA: Diagnosis not present

## 2019-03-27 DIAGNOSIS — E119 Type 2 diabetes mellitus without complications: Secondary | ICD-10-CM | POA: Diagnosis not present

## 2019-03-27 DIAGNOSIS — I10 Essential (primary) hypertension: Secondary | ICD-10-CM | POA: Diagnosis not present

## 2019-03-27 DIAGNOSIS — N920 Excessive and frequent menstruation with regular cycle: Secondary | ICD-10-CM | POA: Diagnosis not present

## 2019-03-27 DIAGNOSIS — G10 Huntington's disease: Secondary | ICD-10-CM | POA: Diagnosis not present

## 2019-04-02 ENCOUNTER — Other Ambulatory Visit: Payer: Self-pay | Admitting: Family Medicine

## 2019-04-04 DIAGNOSIS — N920 Excessive and frequent menstruation with regular cycle: Secondary | ICD-10-CM | POA: Diagnosis not present

## 2019-04-04 DIAGNOSIS — F0280 Dementia in other diseases classified elsewhere without behavioral disturbance: Secondary | ICD-10-CM | POA: Diagnosis not present

## 2019-04-04 DIAGNOSIS — E785 Hyperlipidemia, unspecified: Secondary | ICD-10-CM | POA: Diagnosis not present

## 2019-04-04 DIAGNOSIS — I10 Essential (primary) hypertension: Secondary | ICD-10-CM | POA: Diagnosis not present

## 2019-04-04 DIAGNOSIS — E1140 Type 2 diabetes mellitus with diabetic neuropathy, unspecified: Secondary | ICD-10-CM | POA: Diagnosis not present

## 2019-04-04 DIAGNOSIS — R1319 Other dysphagia: Secondary | ICD-10-CM | POA: Diagnosis not present

## 2019-04-04 DIAGNOSIS — G10 Huntington's disease: Secondary | ICD-10-CM | POA: Diagnosis not present

## 2019-04-04 DIAGNOSIS — E119 Type 2 diabetes mellitus without complications: Secondary | ICD-10-CM | POA: Diagnosis not present

## 2019-04-04 DIAGNOSIS — G4739 Other sleep apnea: Secondary | ICD-10-CM | POA: Diagnosis not present

## 2019-04-04 DIAGNOSIS — D259 Leiomyoma of uterus, unspecified: Secondary | ICD-10-CM | POA: Diagnosis not present

## 2019-04-05 DIAGNOSIS — I10 Essential (primary) hypertension: Secondary | ICD-10-CM | POA: Diagnosis not present

## 2019-04-05 DIAGNOSIS — F0280 Dementia in other diseases classified elsewhere without behavioral disturbance: Secondary | ICD-10-CM | POA: Diagnosis not present

## 2019-04-05 DIAGNOSIS — R1319 Other dysphagia: Secondary | ICD-10-CM | POA: Diagnosis not present

## 2019-04-05 DIAGNOSIS — G10 Huntington's disease: Secondary | ICD-10-CM | POA: Diagnosis not present

## 2019-04-05 DIAGNOSIS — N920 Excessive and frequent menstruation with regular cycle: Secondary | ICD-10-CM | POA: Diagnosis not present

## 2019-04-05 DIAGNOSIS — E119 Type 2 diabetes mellitus without complications: Secondary | ICD-10-CM | POA: Diagnosis not present

## 2019-04-08 ENCOUNTER — Telehealth: Payer: Self-pay | Admitting: *Deleted

## 2019-04-08 NOTE — Telephone Encounter (Signed)
pls order as requested, thanks

## 2019-04-08 NOTE — Telephone Encounter (Signed)
Victoria [name] occupational health with [facility] called wanting orders for pt for a resting hand splint for contracted hand and wrist she can let you know the instructions if you want to call her [phone] and she wouldn like the orders and demographics to be sent to [facility] in [place] and their fax number is [phone]

## 2019-04-11 DIAGNOSIS — G10 Huntington's disease: Secondary | ICD-10-CM | POA: Diagnosis not present

## 2019-04-11 DIAGNOSIS — I10 Essential (primary) hypertension: Secondary | ICD-10-CM | POA: Diagnosis not present

## 2019-04-11 DIAGNOSIS — R1319 Other dysphagia: Secondary | ICD-10-CM | POA: Diagnosis not present

## 2019-04-11 DIAGNOSIS — E119 Type 2 diabetes mellitus without complications: Secondary | ICD-10-CM | POA: Diagnosis not present

## 2019-04-11 DIAGNOSIS — F0280 Dementia in other diseases classified elsewhere without behavioral disturbance: Secondary | ICD-10-CM | POA: Diagnosis not present

## 2019-04-11 DIAGNOSIS — N920 Excessive and frequent menstruation with regular cycle: Secondary | ICD-10-CM | POA: Diagnosis not present

## 2019-04-11 MED ORDER — UNABLE TO FIND: 0 refills | Status: DC

## 2019-04-11 NOTE — Addendum Note (Signed)
Addended by: Eual Fines on: 04/11/2019 10:42 AM   Modules accepted: Orders

## 2019-04-18 DIAGNOSIS — F0280 Dementia in other diseases classified elsewhere without behavioral disturbance: Secondary | ICD-10-CM | POA: Diagnosis not present

## 2019-04-18 DIAGNOSIS — G10 Huntington's disease: Secondary | ICD-10-CM | POA: Diagnosis not present

## 2019-04-18 DIAGNOSIS — R1319 Other dysphagia: Secondary | ICD-10-CM | POA: Diagnosis not present

## 2019-04-18 DIAGNOSIS — I10 Essential (primary) hypertension: Secondary | ICD-10-CM | POA: Diagnosis not present

## 2019-04-18 DIAGNOSIS — N920 Excessive and frequent menstruation with regular cycle: Secondary | ICD-10-CM | POA: Diagnosis not present

## 2019-04-18 DIAGNOSIS — E119 Type 2 diabetes mellitus without complications: Secondary | ICD-10-CM | POA: Diagnosis not present

## 2019-04-19 ENCOUNTER — Telehealth: Payer: Self-pay | Admitting: *Deleted

## 2019-04-19 DIAGNOSIS — Z3042 Encounter for surveillance of injectable contraceptive: Secondary | ICD-10-CM

## 2019-04-19 MED ORDER — MEDROXYPROGESTERONE ACETATE 150 MG/ML IM SUSP: 150.0000 mg | INTRAMUSCULAR | 3 refills | Status: DC

## 2019-04-19 NOTE — Telephone Encounter (Signed)
Mother called requesting Depo refill.  States Victoria [name] is completely bedridden now.

## 2019-04-19 NOTE — Addendum Note (Signed)
Addended by: Derrek Monaco A on: 04/19/2019 01:30 PM   Modules accepted: Orders

## 2019-04-19 NOTE — Telephone Encounter (Signed)
Refilled depo 

## 2019-04-23 ENCOUNTER — Telehealth: Payer: Self-pay | Admitting: *Deleted

## 2019-04-23 DIAGNOSIS — N920 Excessive and frequent menstruation with regular cycle: Secondary | ICD-10-CM | POA: Diagnosis not present

## 2019-04-23 DIAGNOSIS — E119 Type 2 diabetes mellitus without complications: Secondary | ICD-10-CM | POA: Diagnosis not present

## 2019-04-23 DIAGNOSIS — R1319 Other dysphagia: Secondary | ICD-10-CM | POA: Diagnosis not present

## 2019-04-23 DIAGNOSIS — G10 Huntington's disease: Secondary | ICD-10-CM | POA: Diagnosis not present

## 2019-04-23 DIAGNOSIS — I10 Essential (primary) hypertension: Secondary | ICD-10-CM | POA: Diagnosis not present

## 2019-04-23 DIAGNOSIS — F0280 Dementia in other diseases classified elsewhere without behavioral disturbance: Secondary | ICD-10-CM | POA: Diagnosis not present

## 2019-04-23 NOTE — Telephone Encounter (Signed)
Pt needs refills on her depo for December. She is bedridden currently. Her mother is requesting a callback to discuss. She needs it to be sent to [facility].

## 2019-04-25 DIAGNOSIS — E119 Type 2 diabetes mellitus without complications: Secondary | ICD-10-CM | POA: Diagnosis not present

## 2019-04-25 DIAGNOSIS — R1319 Other dysphagia: Secondary | ICD-10-CM | POA: Diagnosis not present

## 2019-04-25 DIAGNOSIS — G10 Huntington's disease: Secondary | ICD-10-CM | POA: Diagnosis not present

## 2019-04-25 DIAGNOSIS — I10 Essential (primary) hypertension: Secondary | ICD-10-CM | POA: Diagnosis not present

## 2019-04-25 DIAGNOSIS — N920 Excessive and frequent menstruation with regular cycle: Secondary | ICD-10-CM | POA: Diagnosis not present

## 2019-04-25 DIAGNOSIS — F0280 Dementia in other diseases classified elsewhere without behavioral disturbance: Secondary | ICD-10-CM | POA: Diagnosis not present

## 2019-04-29 ENCOUNTER — Other Ambulatory Visit: Payer: Self-pay | Admitting: Family Medicine

## 2019-04-30 ENCOUNTER — Other Ambulatory Visit: Payer: Self-pay | Admitting: Family Medicine

## 2019-05-03 DIAGNOSIS — E119 Type 2 diabetes mellitus without complications: Secondary | ICD-10-CM | POA: Diagnosis not present

## 2019-05-03 DIAGNOSIS — R1319 Other dysphagia: Secondary | ICD-10-CM | POA: Diagnosis not present

## 2019-05-03 DIAGNOSIS — I10 Essential (primary) hypertension: Secondary | ICD-10-CM | POA: Diagnosis not present

## 2019-05-03 DIAGNOSIS — F0280 Dementia in other diseases classified elsewhere without behavioral disturbance: Secondary | ICD-10-CM | POA: Diagnosis not present

## 2019-05-03 DIAGNOSIS — N920 Excessive and frequent menstruation with regular cycle: Secondary | ICD-10-CM | POA: Diagnosis not present

## 2019-05-03 DIAGNOSIS — G10 Huntington's disease: Secondary | ICD-10-CM | POA: Diagnosis not present

## 2019-05-04 DIAGNOSIS — I10 Essential (primary) hypertension: Secondary | ICD-10-CM | POA: Diagnosis not present

## 2019-05-04 DIAGNOSIS — D259 Leiomyoma of uterus, unspecified: Secondary | ICD-10-CM | POA: Diagnosis not present

## 2019-05-04 DIAGNOSIS — G4739 Other sleep apnea: Secondary | ICD-10-CM | POA: Diagnosis not present

## 2019-05-04 DIAGNOSIS — N920 Excessive and frequent menstruation with regular cycle: Secondary | ICD-10-CM | POA: Diagnosis not present

## 2019-05-04 DIAGNOSIS — E785 Hyperlipidemia, unspecified: Secondary | ICD-10-CM | POA: Diagnosis not present

## 2019-05-04 DIAGNOSIS — G10 Huntington's disease: Secondary | ICD-10-CM | POA: Diagnosis not present

## 2019-05-04 DIAGNOSIS — F0280 Dementia in other diseases classified elsewhere without behavioral disturbance: Secondary | ICD-10-CM | POA: Diagnosis not present

## 2019-05-04 DIAGNOSIS — E1140 Type 2 diabetes mellitus with diabetic neuropathy, unspecified: Secondary | ICD-10-CM | POA: Diagnosis not present

## 2019-05-09 ENCOUNTER — Telehealth: Payer: Self-pay

## 2019-05-09 DIAGNOSIS — N920 Excessive and frequent menstruation with regular cycle: Secondary | ICD-10-CM | POA: Diagnosis not present

## 2019-05-09 DIAGNOSIS — G10 Huntington's disease: Secondary | ICD-10-CM | POA: Diagnosis not present

## 2019-05-09 DIAGNOSIS — F0280 Dementia in other diseases classified elsewhere without behavioral disturbance: Secondary | ICD-10-CM | POA: Diagnosis not present

## 2019-05-09 DIAGNOSIS — I10 Essential (primary) hypertension: Secondary | ICD-10-CM | POA: Diagnosis not present

## 2019-05-09 DIAGNOSIS — E1140 Type 2 diabetes mellitus with diabetic neuropathy, unspecified: Secondary | ICD-10-CM | POA: Diagnosis not present

## 2019-05-09 DIAGNOSIS — E785 Hyperlipidemia, unspecified: Secondary | ICD-10-CM | POA: Diagnosis not present

## 2019-05-09 NOTE — Telephone Encounter (Signed)
Patients mother states she has been on thickit for awhile now and does admit that the cough doesn't sound good for the past few days but doesn't think she could get her out to get an xray

## 2019-05-09 NOTE — Telephone Encounter (Signed)
If aspirating, which is likely, only protection is PEG tube , I do not think Mother wants that. Recommend thickened feeding  ( use thickit) feed with HOB at angle and small amounts If no fever or symptoms of infection causing cough I suggest start with the obvious

## 2019-05-09 NOTE — Telephone Encounter (Signed)
I spoke with Mother will carry out precautionary feeding, small amounts of thickened food, describes choking/ coughing when eating, no interest in PEG feeds

## 2019-05-09 NOTE — Telephone Encounter (Signed)
Nurse from [place] is concerned about [place] For the past 2 weeks she states patient has had a cough (mother states its been for months, after she eats mostly) but nurse states its worsened recently and where she is unable to take a deep breath for her she is unable to get a good listen at her lungs but said the cough is wet sounding and doesn't sound good and she is afraid that she is aspirating. Vitals are normal. Temp 98.5 today. They do NOT have mobile xray at [facility]. Please advise. Mother wanted your advice

## 2019-05-13 ENCOUNTER — Telehealth: Payer: Self-pay

## 2019-05-13 NOTE — Telephone Encounter (Signed)
Wheelchair assesment completed, need Face to Face sent to [phone]

## 2019-05-13 NOTE — Telephone Encounter (Signed)
Face to face faxed to number given

## 2019-05-14 DIAGNOSIS — N920 Excessive and frequent menstruation with regular cycle: Secondary | ICD-10-CM | POA: Diagnosis not present

## 2019-05-14 DIAGNOSIS — E1140 Type 2 diabetes mellitus with diabetic neuropathy, unspecified: Secondary | ICD-10-CM | POA: Diagnosis not present

## 2019-05-14 DIAGNOSIS — F0280 Dementia in other diseases classified elsewhere without behavioral disturbance: Secondary | ICD-10-CM | POA: Diagnosis not present

## 2019-05-14 DIAGNOSIS — I10 Essential (primary) hypertension: Secondary | ICD-10-CM | POA: Diagnosis not present

## 2019-05-14 DIAGNOSIS — E785 Hyperlipidemia, unspecified: Secondary | ICD-10-CM | POA: Diagnosis not present

## 2019-05-14 DIAGNOSIS — G10 Huntington's disease: Secondary | ICD-10-CM | POA: Diagnosis not present

## 2019-05-15 DIAGNOSIS — G10 Huntington's disease: Secondary | ICD-10-CM | POA: Diagnosis not present

## 2019-05-15 DIAGNOSIS — I10 Essential (primary) hypertension: Secondary | ICD-10-CM

## 2019-05-15 DIAGNOSIS — E1140 Type 2 diabetes mellitus with diabetic neuropathy, unspecified: Secondary | ICD-10-CM | POA: Diagnosis not present

## 2019-05-15 DIAGNOSIS — F0280 Dementia in other diseases classified elsewhere without behavioral disturbance: Secondary | ICD-10-CM | POA: Diagnosis not present

## 2019-05-15 DIAGNOSIS — G4739 Other sleep apnea: Secondary | ICD-10-CM

## 2019-05-15 DIAGNOSIS — N920 Excessive and frequent menstruation with regular cycle: Secondary | ICD-10-CM | POA: Diagnosis not present

## 2019-05-15 DIAGNOSIS — D259 Leiomyoma of uterus, unspecified: Secondary | ICD-10-CM

## 2019-05-15 DIAGNOSIS — E785 Hyperlipidemia, unspecified: Secondary | ICD-10-CM

## 2019-05-21 ENCOUNTER — Other Ambulatory Visit: Payer: Self-pay | Admitting: Obstetrics and Gynecology

## 2019-05-21 DIAGNOSIS — Z3042 Encounter for surveillance of injectable contraceptive: Secondary | ICD-10-CM

## 2019-05-21 NOTE — Telephone Encounter (Signed)
Refilled Depo Provera for menses control for this 47 yo female

## 2019-05-24 DIAGNOSIS — E785 Hyperlipidemia, unspecified: Secondary | ICD-10-CM | POA: Diagnosis not present

## 2019-05-24 DIAGNOSIS — N920 Excessive and frequent menstruation with regular cycle: Secondary | ICD-10-CM | POA: Diagnosis not present

## 2019-05-24 DIAGNOSIS — F0280 Dementia in other diseases classified elsewhere without behavioral disturbance: Secondary | ICD-10-CM | POA: Diagnosis not present

## 2019-05-24 DIAGNOSIS — G10 Huntington's disease: Secondary | ICD-10-CM | POA: Diagnosis not present

## 2019-05-24 DIAGNOSIS — I10 Essential (primary) hypertension: Secondary | ICD-10-CM | POA: Diagnosis not present

## 2019-05-24 DIAGNOSIS — E1140 Type 2 diabetes mellitus with diabetic neuropathy, unspecified: Secondary | ICD-10-CM | POA: Diagnosis not present

## 2019-05-28 ENCOUNTER — Other Ambulatory Visit: Payer: Self-pay | Admitting: Family Medicine

## 2019-05-28 DIAGNOSIS — N920 Excessive and frequent menstruation with regular cycle: Secondary | ICD-10-CM | POA: Diagnosis not present

## 2019-05-28 DIAGNOSIS — E1140 Type 2 diabetes mellitus with diabetic neuropathy, unspecified: Secondary | ICD-10-CM | POA: Diagnosis not present

## 2019-05-28 DIAGNOSIS — G10 Huntington's disease: Secondary | ICD-10-CM | POA: Diagnosis not present

## 2019-05-28 DIAGNOSIS — F0280 Dementia in other diseases classified elsewhere without behavioral disturbance: Secondary | ICD-10-CM | POA: Diagnosis not present

## 2019-05-28 DIAGNOSIS — E785 Hyperlipidemia, unspecified: Secondary | ICD-10-CM | POA: Diagnosis not present

## 2019-05-28 DIAGNOSIS — I10 Essential (primary) hypertension: Secondary | ICD-10-CM | POA: Diagnosis not present

## 2019-06-04 ENCOUNTER — Telehealth: Payer: Self-pay | Admitting: *Deleted

## 2019-06-04 DIAGNOSIS — G10 Huntington's disease: Secondary | ICD-10-CM

## 2019-06-04 NOTE — Telephone Encounter (Signed)
Pt mother patricia called said addams order for home health needed to be changed to skilled nursing so she can get her depo shot its due by Monday

## 2019-06-05 NOTE — Telephone Encounter (Signed)
Explained to mother that [facility] was going to call tomorrow am and let me know when one of their nurses could go out to set up a video visit so they could go out to do teaching of diease process and teach family member how to do the injection

## 2019-06-05 NOTE — Telephone Encounter (Signed)
Ask if they can bring pt tomorrow for depo shot

## 2019-06-05 NOTE — Telephone Encounter (Signed)
Pt mother patricia said this is not an option and she cannot get her up needs something else doene

## 2019-06-06 NOTE — Addendum Note (Signed)
Addended by: Eual Fines on: 06/06/2019 09:57 AM   Modules accepted: Orders

## 2019-06-07 ENCOUNTER — Telehealth: Payer: Self-pay

## 2019-06-07 ENCOUNTER — Other Ambulatory Visit: Payer: Self-pay

## 2019-06-07 MED ORDER — LOVASTATIN 20 MG PO TABS: ORAL_TABLET | ORAL | 0 refills | Status: AC

## 2019-06-07 NOTE — Telephone Encounter (Signed)
Mother called and was upset she did not get a call yesterday.  She said she is waiting for someone to call about a Depo shot. I told her you were not in the office today and you would call her on Monday.

## 2019-06-10 ENCOUNTER — Telehealth: Payer: Self-pay | Admitting: *Deleted

## 2019-06-10 ENCOUNTER — Ambulatory Visit: Payer: Medicare Other | Admitting: Family Medicine

## 2019-06-10 ENCOUNTER — Other Ambulatory Visit: Payer: Self-pay

## 2019-06-10 NOTE — Telephone Encounter (Signed)
Mother aware that [facility] states a nurse was coming out and  to make sure she picked up the shot from the pharmacy.

## 2019-06-10 NOTE — Telephone Encounter (Signed)
I had spoken with mother and we had gotten a visit set up with [facility] and it was scheduled for this am at 8:40 but she called and said that she did not have a nurse to come out to do the face to face visit today so it has been cancelled.

## 2019-06-10 NOTE — Telephone Encounter (Signed)
Just spoke with [facility] and they are calling her now and said they weren't able to get a nurse to go today but they would do it tomorrow and were letting mother know

## 2019-06-10 NOTE — Telephone Encounter (Signed)
noted 

## 2019-06-10 NOTE — Telephone Encounter (Signed)
Santiago Glad with [facility] said they couldn't get an nurse out to help Victoria [name] with her face to face. They were still going to go out and help with shot but it would be the first of the year before they could get someone out to help her with face to face appt so they could get paid. She would call back to set this up once she has a nurse to go out there.

## 2019-06-10 NOTE — Telephone Encounter (Signed)
Pts mother was told to call back if they had not heard from [facility] to do pt shot and she wanted to let brandi know that she has not heard back from them and they have not been out there

## 2019-06-11 DIAGNOSIS — M6281 Muscle weakness (generalized): Secondary | ICD-10-CM | POA: Diagnosis not present

## 2019-06-11 DIAGNOSIS — G839 Paralytic syndrome, unspecified: Secondary | ICD-10-CM | POA: Diagnosis not present

## 2019-06-11 DIAGNOSIS — G10 Huntington's disease: Secondary | ICD-10-CM | POA: Diagnosis not present

## 2019-06-17 DIAGNOSIS — M6281 Muscle weakness (generalized): Secondary | ICD-10-CM | POA: Diagnosis not present

## 2019-06-17 DIAGNOSIS — G839 Paralytic syndrome, unspecified: Secondary | ICD-10-CM | POA: Diagnosis not present

## 2019-06-17 DIAGNOSIS — G10 Huntington's disease: Secondary | ICD-10-CM | POA: Diagnosis not present

## 2019-06-18 ENCOUNTER — Emergency Department (HOSPITAL_COMMUNITY): Payer: Medicare Other

## 2019-06-18 ENCOUNTER — Telehealth: Payer: Self-pay

## 2019-06-18 ENCOUNTER — Emergency Department (HOSPITAL_COMMUNITY)
Admission: EM | Admit: 2019-06-18 | Discharge: 2019-06-18 | Disposition: A | Discharge status: Home / Self Care | Payer: Medicare Other | Attending: Emergency Medicine | Admitting: Emergency Medicine

## 2019-06-18 ENCOUNTER — Other Ambulatory Visit: Payer: Self-pay

## 2019-06-18 ENCOUNTER — Encounter (HOSPITAL_COMMUNITY): Payer: Self-pay | Admitting: Emergency Medicine

## 2019-06-18 DIAGNOSIS — G3184 Mild cognitive impairment, so stated: Secondary | ICD-10-CM | POA: Diagnosis not present

## 2019-06-18 DIAGNOSIS — R05 Cough: Secondary | ICD-10-CM | POA: Diagnosis present

## 2019-06-18 DIAGNOSIS — I10 Essential (primary) hypertension: Secondary | ICD-10-CM | POA: Insufficient documentation

## 2019-06-18 DIAGNOSIS — Z79899 Other long term (current) drug therapy: Secondary | ICD-10-CM | POA: Insufficient documentation

## 2019-06-18 DIAGNOSIS — E119 Type 2 diabetes mellitus without complications: Secondary | ICD-10-CM | POA: Insufficient documentation

## 2019-06-18 DIAGNOSIS — E860 Dehydration: Secondary | ICD-10-CM | POA: Diagnosis not present

## 2019-06-18 LAB — URINALYSIS, ROUTINE W REFLEX MICROSCOPIC
Bacteria, UA: NONE SEEN
Bilirubin Urine: NEGATIVE
Glucose, UA: NEGATIVE mg/dL
Ketones, ur: 5 mg/dL — AB
Leukocytes,Ua: NEGATIVE
Nitrite: NEGATIVE
Protein, ur: NEGATIVE mg/dL
Specific Gravity, Urine: 1.024 (ref 1.005–1.030)
pH: 5 (ref 5.0–8.0)

## 2019-06-18 LAB — COMPREHENSIVE METABOLIC PANEL
ALT: 10 U/L (ref 0–44)
AST: 11 U/L — ABNORMAL LOW (ref 15–41)
Albumin: 3.5 g/dL (ref 3.5–5.0)
Alkaline Phosphatase: 51 U/L (ref 38–126)
Anion gap: 9 (ref 5–15)
BUN: 14 mg/dL (ref 6–20)
CO2: 26 mmol/L (ref 22–32)
Calcium: 10.2 mg/dL (ref 8.9–10.3)
Chloride: 107 mmol/L (ref 98–111)
Creatinine, Ser: 0.93 mg/dL (ref 0.44–1.00)
GFR calc Af Amer: >60 mL/min (ref >60)
GFR calc non Af Amer: >60 mL/min (ref >60)
Glucose, Bld: 80 mg/dL (ref 70–99)
Potassium: 3.6 mmol/L (ref 3.5–5.1)
Sodium: 142 mmol/L (ref 135–145)
Total Bilirubin: 0.4 mg/dL (ref 0.3–1.2)
Total Protein: 7 g/dL (ref 6.5–8.1)

## 2019-06-18 LAB — CBC WITH DIFFERENTIAL/PLATELET
Abs Immature Granulocytes: 0 K/uL (ref 0.00–0.07)
Basophils Absolute: 0 K/uL (ref 0.0–0.1)
Basophils Relative: 1 %
Eosinophils Absolute: 0.1 K/uL (ref 0.0–0.5)
Eosinophils Relative: 2 %
HCT: 42.9 % (ref 36.0–46.0)
Hemoglobin: 13.1 g/dL (ref 12.0–15.0)
Immature Granulocytes: 0 %
Lymphocytes Relative: 30 %
Lymphs Abs: 1.6 K/uL (ref 0.7–4.0)
MCH: 29.3 pg (ref 26.0–34.0)
MCHC: 30.5 g/dL (ref 30.0–36.0)
MCV: 96 fL (ref 80.0–100.0)
Monocytes Absolute: 0.3 K/uL (ref 0.1–1.0)
Monocytes Relative: 5 %
Neutro Abs: 3.3 K/uL (ref 1.7–7.7)
Neutrophils Relative %: 62 %
Platelets: 211 K/uL (ref 150–400)
RBC: 4.47 MIL/uL (ref 3.87–5.11)
RDW: 12.7 % (ref 11.5–15.5)
WBC: 5.3 K/uL (ref 4.0–10.5)
nRBC: 0 % (ref 0.0–0.2)

## 2019-06-18 MED ORDER — SODIUM CHLORIDE 0.9 % IV SOLN: INTRAVENOUS | Status: DC

## 2019-06-18 NOTE — ED Notes (Signed)
Called [facility] at this time for transport . Victoria [name]

## 2019-06-18 NOTE — ED Provider Notes (Signed)
[facility] EMERGENCY DEPARTMENT Provider Note   CSN: [id number] Arrival date & time: 06/18/19  1617     History Chief Complaint  Patient presents with  . Cough    Victoria [name] is a 48 y.o. female.  HPI   This patient is a 48 year old female, she has a known history of Huntington's disease, advanced, at the age of 69 she does not walk, she does not talk, she has cognitive impairment, hypertension and diabetes.  She had a possible witnessed aspiration event while she was at home, she was choking on her food and then appeared to have some difficulty breathing.  By the time the paramedics arrived the patient appeared back to her normal comfortable self and they detected no abnormal vital signs.  The patient does not answer questions, level 5 caveat applies.  Past Medical History:  Diagnosis Date  . Anxiety   . Diabetes mellitus   . Dyslipidemia   . Fibroids 06/10/2014  . Huntington's disease ([place])   . Hypertension   . Menorrhagia with regular cycle 06/02/2014  . Mild cognitive impairment   . Obesity   . Perennial allergic rhinitis   . Sleep apnea    cannot tolerate CPAP.    Patient Active Problem List   Diagnosis Date Noted  . Weight loss, abnormal 01/16/2017  . Urinary incontinence in female 08/13/2015  . 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 Dysphagia 10/14/2014  . Dandruff 09/07/2014  . Intramural leiomyoma of uterus 06/30/2014  . Heavy menses 12/03/2013  . Falls 08/03/2011  . Lack of coordination 08/03/2011  . Muscle weakness (generalized) 08/03/2011  . HUNTINGTON'S DISEASE 03/15/2009  . Unspecified visual loss 02/19/2008  . Hyperlipidemia with target LDL less than 100 07/19/2007  . Neurodegenerative cognitive impairment ([place]) 07/19/2007    History reviewed. No pertinent surgical history.   OB History    Gravida  1   Para  1   Term  1   Preterm      AB      Living  1     SAB       TAB      Ectopic      Multiple      Live Births  1           Family History  Problem Relation Age of Onset  . Huntington's disease Father   . Diabetes Mother   . Hypertension Mother   . Hyperlipidemia Mother   . Huntington's disease Brother   . Alzheimer's disease Maternal Grandmother   . Cancer Other        family history   . Diabetes Other        family history   . Heart defect Other        family history     Social History   Tobacco Use  . Smoking status: Never Smoker  . Smokeless tobacco: Never Used  Substance Use Topics  . Alcohol use: No  . Drug use: No    Home Medications Prior to Admission medications   Medication Sig Start Date End Date Taking? Authorizing Provider  acetaminophen (TYLENOL) 500 MG tablet Take 500 mg by mouth every 6 (six) hours as needed for mild pain or moderate pain.   Yes [provider]  citalopram (CELEXA) 20 MG tablet TAKE 1 TABLET BY MOUTH DAILY Patient taking differently: Take 20 mg by mouth daily.  04/30/19  Yes Fayrene Helper,  MD  Clobetasol Propionate (CLOBEX) 0.05 % shampoo Apply topically twice daily Patient taking differently: Apply 1 application topically 2 (two) times daily. Apply topically twice daily 03/19/13  Yes Fayrene Helper, MD  donepezil (ARICEPT) 10 MG tablet TAKE 1 TABLET BY MOUTH DAILY. Patient taking differently: Take 10 mg by mouth at bedtime.  04/30/19  Yes Fayrene Helper, MD  ergocalciferol (VITAMIN D2) 1.25 MG (50000 UT) capsule Take 1 capsule (50,000 Units total) by mouth once a week. One capsule once weekly 10/24/18  Yes Fayrene Helper, MD  lovastatin (MEVACOR) 20 MG tablet TAKE ONE TABLET (20MG  TOTAL) BY MOUTH ATBEDTIME 06/07/19  Yes Fayrene Helper, MD  medroxyPROGESTERone Acetate 150 MG/ML SUSY INJECT 1 ML (150 MG TOTAL) INTO THE MUSCLE EVERY 3 MONTHS Patient taking differently: Inject 150 mg into the skin every 3 (three) months.  05/21/19  Yes [name], MD   memantine (NAMENDA) 10 MG tablet TAKE (1) TABLET BY MOUTH TWICE DAILY. Patient taking differently: Take 10 mg by mouth 2 (two) times daily.  03/26/19  Yes Fayrene Helper, MD  QUEtiapine (SEROQUEL) 50 MG tablet TAKE 1 TABLET BY MOUTH 3 TIMES DAILY. Patient taking differently: Take 50 mg by mouth 3 (three) times daily.  07/30/18  Yes Fayrene Helper, MD  risperiDONE (RISPERDAL) 1 MG tablet TAKE (1) TABLET BY MOUTH TWICE DAILY. Patient taking differently: Take 1 mg by mouth 2 (two) times daily.  05/28/19  Yes Fayrene Helper, MD  ST[place]-M[place] DEXTRIN (THICK-IT) POWD 4 tsp per 6 ounces of liquid. Patient taking differently: Take 60 mLs by mouth See admin instructions. Mix 4 tsp per 6 ounces of liquid. 12/01/17  Yes Fayrene Helper, MD  Incontinence Supply Disposable (UNDERPADS) MISC 30 per month Patient not taking: Reported on 06/18/2019 08/10/16   Fayrene Helper, MD  LORazepam (ATIVAN) 0.5 MG tablet Take 1 tablet (0.5 mg total) by mouth 2 (two) times daily. Patient not taking: Reported on 06/18/2019 03/23/19   Fayrene Helper, MD    Allergies    Diphenhydramine hcl  Review of Systems   Review of Systems  Unable to perform ROS: Mental status change    Physical Exam Updated Vital Signs BP 110/77   Pulse 89   Temp (!) 97.4 F (36.3 C) (Oral)   Resp 16   Wt 63 kg   SpO2 100%   BMI 23.84 kg/m   Physical Exam Vitals and nursing note reviewed.  Constitutional:      General: She is not in acute distress.    Appearance: She is well-developed.  HENT:     Head: Normocephalic and atraumatic.     Mouth/Throat:     Pharynx: No oropharyngeal exudate.  Eyes:     General: No scleral icterus.       Right eye: No discharge.        Left eye: No discharge.     Conjunctiva/sclera: Conjunctivae normal.     Pupils: Pupils are equal, round, and reactive to light.  Neck:     Thyroid: No thyromegaly.     Vascular: No JVD.  Cardiovascular:     Rate and Rhythm: Normal rate  and regular rhythm.     Heart sounds: Normal heart sounds. No murmur. No friction rub. No gallop.   Pulmonary:     Effort: Pulmonary effort is normal. No respiratory distress.     Breath sounds: Normal breath sounds. No wheezing or rales.  Abdominal:     General:  Bowel sounds are normal. There is no distension.     Palpations: Abdomen is soft. There is no mass.     Tenderness: There is no abdominal tenderness.  Musculoskeletal:        General: No tenderness. Normal range of motion.     Cervical back: Normal range of motion and neck supple.  Lymphadenopathy:     Cervical: No cervical adenopathy.  Skin:    General: Skin is warm and dry.     Findings: No erythema or rash.  Neurological:     Mental Status: She is alert.     Comments: This patient is awake alert, she does not follow commands, she will not open her mouth when I asked her to.  She seems to respond to painful stimuli  Psychiatric:        Behavior: Behavior normal.     ED Results / Procedures / Treatments   Labs (all labs ordered are listed, but only abnormal results are displayed) Labs Reviewed  URINALYSIS, ROUTINE W REFLEX MICROSCOPIC - Abnormal; Notable for the following components:      Result Value   Hgb urine dipstick SMALL (*)    Ketones, ur 5 (*)    All other components within normal limits  COMPREHENSIVE METABOLIC PANEL - Abnormal; Notable for the following components:   AST 11 (*)    All other components within normal limits  URINE CULTURE  CBC WITH DIFFERENTIAL/PLATELET    EKG None  Radiology DG Chest Port 1 View  Result Date: 06/18/2019 CLINICAL DATA:  Cough, possible aspiration EXAM: PORTABLE CHEST 1 VIEW COMPARISON:  06/24/2017 FINDINGS: The lungs are clear. There is no pleural effusion or pneumothorax. Cardiomediastinal contours are within normal limits with normal heart size. IMPRESSION: No acute process in the chest. Electronically Signed   By: Macy Mis M.D.   On: 06/18/2019 17:12     Procedures Procedures (including critical care time)  Medications Ordered in ED Medications  0.9 %  sodium chloride infusion ( Intravenous New Bag/Given 06/18/19 1929)    ED Course  I have reviewed the triage vital signs and the nursing notes.  Pertinent labs & imaging results that were available during my care of the patient were reviewed by me and considered in my medical decision making (see chart for details).  Clinical Course as of Jun 17 2048  Tue Jun 18, 2019  1723 The chest x-ray shows no acute infiltrates, her oxygen is 100%, she is afebrile   [BM]  1803 I discussed the patient's care with his primary caregiver at home, reports that she has been having some minor difficulty with her diet, they have been doing pured meals, very small amounts more frequently but at nighttime after she eating she tends to have minor aspiration events which end up with coughing spells.  The family is concerned that she may be dehydrated, the family doctor had recommended they come to the hospital for further evaluation.  The family is insistent upon laboratory work-up, the x-ray has been performed and is totally clear and reassuring.  I communicated this to the family.  We will proceed with lab work including metabolic panel and urinalysis   [BM]  1956 The patient's still appears well, discussed all of the results with the family member at the bedside, mild ketones, IV fluids are being given, will give 500 cc but however I anticipate the ability for this patient to be discharged home and the family member at the bedside is in agreement   [  BM]    Clinical Course User Index [BM] Noemi Chapel, MD   MDM Rules/Calculators/A&P                       The patient has flexion contractures, she is chronically ill, she does not appear to be in distress and has normal lung sounds with an oxygen of 100%.  We will obtain a chest x-ray to rule out infiltrate  IV fluids given, stable for discharge  Final  Clinical Impression(s) / ED Diagnoses Final diagnoses:  Dehydration    Rx / DC Orders ED Discharge Orders    None       Noemi Chapel, MD 06/18/19 2049

## 2019-06-18 NOTE — Telephone Encounter (Signed)
Arlyn Leak with [facility] called and stated that one of her nurses was out doing a late visit on the patient on 06/17/2019. She had crackles in the left lung, was unable to lie down flat, fever, low blood pressure,cough. The family is reluctant to take patient to the ED for evaluation. Patients vitals at this visit were T 100.3 R 22  O2 96% BP 94/60 P84. They asked a nurse from [facility] to call and speak with the patients mother because she was refusing to take the patient to the ED for evaluation.  I called and spoke with the patients mother and advised her that the patient needed to go to the ED for evaluation. She had a slight fever the night before, a cough, was unable to breath while lying down, and had crackles in her lung. She really needed to be evaluated and not wait. She needed blood work and Production manager and a Covid test because some of her symptoms were Covid symptoms. Mother stated she would call her sister and they would discuss it and she would call me back and let me know of their decision.

## 2019-06-18 NOTE — Telephone Encounter (Signed)
They are going to send Victoria [name] to the ER for evaluation.

## 2019-06-18 NOTE — ED Triage Notes (Signed)
[facility] reports they got called to patient's residence today. [name] caregiver reports she had a bad coughing spell while eating dinner yesterday evening and today her lungs sound congested.

## 2019-06-18 NOTE — ED Notes (Signed)
Unable to establish IV after two attempts. Charge Nurse notified to try.

## 2019-06-18 NOTE — Discharge Instructions (Signed)
All of the test have been normal except for the urinalysis which showed very mild dehydration.  The x-ray showed no pneumonia, the blood counts were normal, the kidney and liver tests were normal and the electrolytes were normal.  This is all very good news.  Please continue to follow-up with your family doctor and you may want to follow-up with the gastroenterologist to have further discussion about potentially thinking about a gastroenterostomy feeding tube for the future if aspiration events continue to happen.  Emergency department for severe or worsening difficulty breathing

## 2019-06-19 ENCOUNTER — Encounter: Payer: Self-pay | Admitting: Family Medicine

## 2019-06-19 ENCOUNTER — Ambulatory Visit (INDEPENDENT_AMBULATORY_CARE_PROVIDER_SITE_OTHER): Payer: Medicare Other | Admitting: Family Medicine

## 2019-06-19 VITALS — BP 115/80 | Ht 64.0 in | Wt 138.0 lb

## 2019-06-19 DIAGNOSIS — G10 Huntington's disease: Secondary | ICD-10-CM | POA: Diagnosis not present

## 2019-06-19 DIAGNOSIS — T17308D Unspecified foreign body in larynx causing other injury, subsequent encounter: Secondary | ICD-10-CM

## 2019-06-19 DIAGNOSIS — R0989 Other specified symptoms and signs involving the circulatory and respiratory systems: Secondary | ICD-10-CM | POA: Diagnosis not present

## 2019-06-19 DIAGNOSIS — E8809 Other disorders of plasma-protein metabolism, not elsewhere classified: Secondary | ICD-10-CM | POA: Insufficient documentation

## 2019-06-19 DIAGNOSIS — Z6823 Body mass index (BMI) 23.0-23.9, adult: Secondary | ICD-10-CM

## 2019-06-19 DIAGNOSIS — T17308A Unspecified foreign body in larynx causing other injury, initial encounter: Secondary | ICD-10-CM | POA: Insufficient documentation

## 2019-06-19 DIAGNOSIS — R634 Abnormal weight loss: Secondary | ICD-10-CM | POA: Diagnosis not present

## 2019-06-19 NOTE — Assessment & Plan Note (Signed)
Reduced intake and increased difficulty swallowing , hospice consult

## 2019-06-19 NOTE — Patient Instructions (Signed)
F/U in February, call if you need me before  I will have GI evaluate you to make recommendations re safest way to  Take in food  I will also refer you back to  Hospice re malnutrition   Please eat small amounts often  Thanks for choosing  [facility], we consider it a privelige to serve you.

## 2019-06-19 NOTE — Assessment & Plan Note (Signed)
borderline malnutrtion, refer hospice

## 2019-06-19 NOTE — Assessment & Plan Note (Signed)
Increased and more severe, will refer to GI for formal consult as to options fotr nutrition and recommendations re thickit and consistency of food to be eaten.

## 2019-06-19 NOTE — Progress Notes (Signed)
Virtual Visit via Telephone Note  I connected with Victoria [name] on 06/19/19 at  3:40 PM EST by telephone and verified that I am speaking with the correct person using two identifiers.  Location: Patient: home  Provider:ooffice   I discussed the limitations, risks, security and privacy concerns of performing an evaluation and management service by telephone and the availability of in person appointments. I also discussed with the patient that there may be a patient responsible charge related to this service. The patient expressed understanding and agreed to proceed.   History of Present Illness: Increased choking episodes, over past 3 days  , even with thickit,, the excess coughing when choking appears to hurt her chest , was taken to the Ed yesterday,no change in condition and no new meds. States no suctioning done in the ED at visit, and she had a CXR which is clear. I explained to Mother that her choking is likely a result of deterioration in her neurologic condition. I asked specifically if she would be interested in tube feeding, she asked what do I suggest, and I explaine that the decision is hers to make , we have had discussion face to face in the past regarding the chronic downhill course of Victoria [name], and Mother has not committed to end of life wishes for Victoria [name] Has been in hospice in the past , recently discharged, however , reduced intake has her borderline malnourished with albumin at 3.5 will try to get her back in , she does qualify not only on malnutrition , but most importantly because  Of her Neurologic illness with deterioration in her overall health. [place] in support of return to hospice, will feed small amounts of thickened food more frequently, and also agrees to GI consul for discussion on alternatives to feeding Observations/Objective: BP 115/80   Ht 5\' 4"  (1.626 m)   Wt 138 lb (62.6 kg)   BMI 23.69 kg/m  Historian is Mother, pt is aphasic  Assessment and  Plan: Choking Increased and more severe, will refer to GI for formal consult as to options fotr nutrition and recommendations re thickit and consistency of food to be eaten.    Weight loss, abnormal Reduced intake and increased difficulty swallowing , hospice consult  HUNTINGTON'S DISEASE Ongoing deterioration in quality of life with inadequate nutrition, hospicer referral indicated  Hypoalbuminemia borderline malnutrtion, refer hospice    Follow Up Instructions:    I discussed the assessment and treatment plan with the patient. The patient was provided an opportunity to ask questions and all were answered. The patient agreed with the plan and demonstrated an understanding of the instructions.   The patient was advised to call back or seek an in-person evaluation if the symptoms worsen or if the condition fails to improve as anticipated.  I provided 25 minutes of non-face-to-face time during this encounter.   Tula Nakayama, MD

## 2019-06-19 NOTE — Assessment & Plan Note (Signed)
Ongoing deterioration in quality of life with inadequate nutrition, hospicer referral indicated

## 2019-06-20 ENCOUNTER — Encounter: Payer: Self-pay | Admitting: Gastroenterology

## 2019-06-20 LAB — URINE CULTURE: Culture: NO GROWTH

## 2019-06-27 ENCOUNTER — Telehealth (INDEPENDENT_AMBULATORY_CARE_PROVIDER_SITE_OTHER): Payer: Medicare Other | Admitting: Family Medicine

## 2019-06-27 ENCOUNTER — Encounter: Payer: Self-pay | Admitting: Family Medicine

## 2019-06-27 ENCOUNTER — Other Ambulatory Visit: Payer: Self-pay

## 2019-06-27 VITALS — BP 115/80 | Ht 64.0 in | Wt 138.0 lb

## 2019-06-27 DIAGNOSIS — M6281 Muscle weakness (generalized): Secondary | ICD-10-CM | POA: Diagnosis not present

## 2019-06-27 DIAGNOSIS — K5900 Constipation, unspecified: Secondary | ICD-10-CM | POA: Diagnosis not present

## 2019-06-27 DIAGNOSIS — G10 Huntington's disease: Secondary | ICD-10-CM | POA: Diagnosis not present

## 2019-06-27 DIAGNOSIS — T17308D Unspecified foreign body in larynx causing other injury, subsequent encounter: Secondary | ICD-10-CM | POA: Diagnosis not present

## 2019-06-27 DIAGNOSIS — G839 Paralytic syndrome, unspecified: Secondary | ICD-10-CM | POA: Diagnosis not present

## 2019-06-27 NOTE — Patient Instructions (Addendum)
F/U AS BEFORE , CALL IF YOU NEED ME SOONER  Start stool softener 2 twice daily, when stool is soft may reduce to one twice daily  Starting today ad  All Bran cereal or shredded wheat cereal to her cheerios and also 2 large prunes  Continue prune juice , warmed every day  Dulcolax suppository use every 3 days if no bowel movement to help her to have one, or magnesium citrate   We are referring kylar to [facility] to see if you can get some in home assistance with her care that you do need as far as managing her feeds as well as bowel movements.  Thanks for choosing [facility], we consider it a privelige to serve you.

## 2019-06-27 NOTE — Progress Notes (Signed)
Appointment scheduled for video visit for Virtual Visit via Telephone Note  I connected with [name] on 06/27/19 at  3:00 PM EST by telephone and verified that I am speaking with the correct person using two identifiers.  Location: Patient: home Provider: office   I discussed the limitations, risks, security and privacy concerns of performing an evaluation and management service by telephone and the availability of in person appointments. I also discussed with the patient that there may be a patient responsible charge related to this service. The patient expressed understanding and agreed to proceed.   History of Present Illness: Specific appointment scheduled for video visit for patient to qualify for in home pT, unfortunately, no one available to facilitate the process as family is incapable Mother c/o constipation and lack of BM x 5 days, as well as ongoing challenge of feeding , states unable to use miralax with thickit , and requesting teaching in the home for help with appropriate feeding as well as managem,ent of constipation. She has never inserted a suppository , but will attempt this in an effort ot facilitate BM   Observations/Objective: BP 115/80   Ht 5\' 4"  (1.626 m)   Wt 138 lb (62.6 kg)   BMI 23.69 kg/m  ( from record) Mother provides history as pt I incapable  Assessment and Plan: Constipation Unable to swallow miralax in thickit, liquids not an option Pt to start daily stool softener a,d high fiber ceral to be added to her daily cheerios as well as prunes, this is to be crushed in the blender. Use suppository/ magnesium citrate every 3 days if no BM  Choking recurrent choking and difficulty mixing and administering thickit for safwe swallowing, pt with advanced huntington's. Will refer for [facility] assessment to determine all available resources to help Mother in her home as far as safe feeding and management of bM's is concerned  HUNTINGTON'S DISEASE Advanced  Huntington's disease with increasing challenges of adequate  nutrition, management of constipation and also impaired mobility and generalized muscle weakness . [facility] consult for social worker visit to determine and facilitate support for Mother as she cares for Misty at home    Follow Up Instructions:    I discussed the assessment and treatment plan with the patient. The patient was provided an opportunity to ask questions and all were answered. The patient agreed with the plan and demonstrated an understanding of the instructions.   The patient was advised to call back or seek an in-person evaluation if the symptoms worsen or if the condition fails to improve as anticipated.  I provided 15 minutes of non-face-to-face time during this encounter.   Tula Nakayama, MD

## 2019-06-28 NOTE — Assessment & Plan Note (Signed)
recurrent choking and difficulty mixing and administering thickit for safwe swallowing, pt with advanced huntington's. Will refer for [facility] assessment to determine all available resources to help Mother in her home as far as safe feeding and management of bM's is concerned

## 2019-06-28 NOTE — Assessment & Plan Note (Signed)
Advanced Huntington's disease with increasing challenges of adequate  nutrition, management of constipation and also impaired mobility and generalized muscle weakness . [facility] consult for social worker visit to determine and facilitate support for Mother as she cares for Acacia at home

## 2019-06-28 NOTE — Assessment & Plan Note (Signed)
Unable to swallow miralax in thickit, liquids not an option Pt to start daily stool softener a,d high fiber ceral to be added to her daily cheerios as well as prunes, this is to be crushed in the blender. Use suppository/ magnesium citrate every 3 days if no BM

## 2019-07-01 ENCOUNTER — Telehealth: Payer: Self-pay

## 2019-07-01 NOTE — Telephone Encounter (Signed)
FYI

## 2019-07-01 NOTE — Addendum Note (Signed)
Addended by: Eual Fines on: 07/01/2019 11:47 AM   Modules accepted: Orders

## 2019-07-01 NOTE — Progress Notes (Addendum)
Referring Provider: Dr. Moshe Cipro Primary Care Physician:  Fayrene Helper, MD  Primary GI: Dr. Oneida Alar   Patient Location: Home   Provider Location: [facility] office   Reason for Visit: Choking, weight loss, low albumin   Persons present on the virtual encounter, with roles: NP, Mother, and Caretaker    Total time (minutes) spent on medical discussion: 40 minutes   Due to COVID-19, visit was conducted using virtual method.  Visit was requested by patient.  Virtual Visit via Telephone Note Due to COVID-19, visit is conducted virtually and was requested by patient.   I connected with [name] on 07/02/19 at  8:30 AM EST by telephone and verified that I am speaking with the correct person using two identifiers.   I discussed the limitations, risks, security and privacy concerns of performing an evaluation and management service by telephone and the availability of in person appointments. I also discussed with the patient that there may be a patient responsible charge related to this service. The patient expressed understanding and agreed to proceed.  Chief Complaint  Patient presents with  . Dysphagia    food/pills; trouble with puree/thickened liquids; able to get down 2-3 oz then starts oozing out of mouth  . Weight Loss    "less than 100 lbs"  . Constipation    no bm in 10 days; takes Miralax daily and prune juice     History of Present Illness: Victoria [name]  is a 49 y.o. female presenting today at the request of Dr. Moshe Cipro due to excessive choking, malnutrition. History of Huntington's disease, diagnosed in 2009,   Weights reviewed dating back to 2016. Previously in the 150s. 2017 mid 140s. Per mother and auntie, she is less than 100 lbs now. Mother, Mardene Celeste is present, along with patient's Auntie: Moreen Fowler, who also works as Quarry manager with patient.    Dysphagia worsening for about 2 weeks. Was eating about 3 times a day prior to this although very small amounts of  pureed food. Small amounts each feeding. Would have  fruit, ensure, banana sandwich, yogurt would go down. Every once in awhile sounded like may be aspirating. Family would help along. Last 2 weeks could tolerate 2-3 ounces of puree at a time in the morning. Would want more but pooling in mouth and oozing out. Cough and choking. Eyes bulged big as well. Getting about 2 ounces intake and hungry states Velma. Feels weak. Went to ED 12/29 and was given IV fluids. Unable to get the attention they felt needed per caretaker Velma, as it was busy in the ED. Home health told Velma they could hear crackling. Cupping hands to beat back. BP cuff: 97/65. Velma can hear breathing. Normally wouldn't hear congestion in the mornings. Yesterday took 2 feedings but no issues till 45 minutes later and then started coughing. Sitting straight up after eating. Would try to lay her down for comfort during the day but had the choking sound.   Constipation: no BM in over a week. Last BM Saturday before last. Miralax and prune juice in the past. Unable to take in liquids other than a few sips. Suppositories recommended.   Speech Therapist: to be scheduled this week. Started over with [place] agency yesterday. Patient not able to follow directions. In the past, family has discussed feeding tubes for patient. Wanted to wait until they had exhausted all forms of oral intake. Mother and Jola Baptist realize the progression of disease and would like to discuss  hospice. They are unsure if they want to go the feeding tube route depending on her prognosis. They are concerned about anesthesia.    Past Medical History:  Diagnosis Date  . Anxiety   . Diabetes mellitus   . Dyslipidemia   . Fibroids 06/10/2014  . Huntington's disease ([place])   . Hypertension   . Menorrhagia with regular cycle 06/02/2014  . Mild cognitive impairment   . Obesity   . Perennial allergic rhinitis   . Sleep apnea    cannot tolerate CPAP.     No past  surgical history on file.   Current Meds  Medication Sig  . acetaminophen (TYLENOL) 500 MG tablet Take 500 mg by mouth every 6 (six) hours as needed for mild pain or moderate pain.  . citalopram (CELEXA) 20 MG tablet TAKE 1 TABLET BY MOUTH DAILY (Patient taking differently: Take 20 mg by mouth daily. )  . Clobetasol Propionate (CLOBEX) 0.05 % shampoo Apply topically twice daily (Patient taking differently: Apply 1 application topically as needed. Apply topically twice daily)  . donepezil (ARICEPT) 10 MG tablet TAKE 1 TABLET BY MOUTH DAILY. (Patient taking differently: Take 10 mg by mouth at bedtime. )  . ergocalciferol (VITAMIN D2) 1.25 MG (50000 UT) capsule Take 1 capsule (50,000 Units total) by mouth once a week. One capsule once weekly  . Incontinence Supply Disposable (UNDERPADS) MISC 30 per month  . LORazepam (ATIVAN) 0.5 MG tablet Take 1 tablet (0.5 mg total) by mouth 2 (two) times daily.  Marland Kitchen lovastatin (MEVACOR) 20 MG tablet TAKE ONE TABLET (20MG  TOTAL) BY MOUTH ATBEDTIME  . medroxyPROGESTERone Acetate 150 MG/ML SUSY INJECT 1 ML (150 MG TOTAL) INTO THE MUSCLE EVERY 3 MONTHS (Patient taking differently: Inject 150 mg into the skin every 3 (three) months. )  . memantine (NAMENDA) 10 MG tablet TAKE (1) TABLET BY MOUTH TWICE DAILY. (Patient taking differently: Take 10 mg by mouth 2 (two) times daily. )  . polyethylene glycol (MIRALAX / GLYCOLAX) 17 g packet Take 17 g by mouth daily.  . QUEtiapine (SEROQUEL) 50 MG tablet TAKE 1 TABLET BY MOUTH 3 TIMES DAILY. (Patient taking differently: Take 50 mg by mouth 3 (three) times daily. )  . risperiDONE (RISPERDAL) 1 MG tablet TAKE (1) TABLET BY MOUTH TWICE DAILY. (Patient taking differently: Take 1 mg by mouth 2 (two) times daily. )  . STARCH-MALTO DEXTRIN (THICK-IT) POWD 4 tsp per 6 ounces of liquid. (Patient taking differently: Take 60 mLs by mouth See admin instructions. Mix 4 tsp per 6 ounces of liquid.)     Family History  Problem Relation Age  of Onset  . Huntington's disease Father   . Diabetes Mother   . Hypertension Mother   . Hyperlipidemia Mother   . Huntington's disease Brother   . Alzheimer's disease Maternal Grandmother   . Cancer Other        family history   . Diabetes Other        family history   . Heart defect Other        family history     Social History   Socioeconomic History  . Marital status: Divorced    Spouse name: Not on file  . Number of children: 1  . Years of education: Not on file  . Highest education level: Not on file  Occupational History  . Occupation: disabled   Tobacco Use  . Smoking status: Never Smoker  . Smokeless tobacco: Never Used  Substance and Sexual Activity  .  Alcohol use: No  . Drug use: No  . Sexual activity: Never    Birth control/protection: Injection  Other Topics Concern  . Not on file  Social History Narrative   Lives with mom   Drinks 1 cup of coffee a day   Social Determinants of Health   Financial Resource Strain:   . Difficulty of Paying Living Expenses: Not on file  Food Insecurity:   . Worried About Charity fundraiser in the Last Year: Not on file  . Ran Out of Food in the Last Year: Not on file  Transportation Needs:   . Lack of Transportation (Medical): Not on file  . Lack of Transportation (Non-Medical): Not on file  Physical Activity:   . Days of Exercise per Week: Not on file  . Minutes of Exercise per Session: Not on file  Stress:   . Feeling of Stress : Not on file  Social Connections:   . Frequency of Communication with Friends and Family: Not on file  . Frequency of Social Gatherings with Friends and Family: Not on file  . Attends Religious Services: Not on file  . Active Member of Clubs or Organizations: Not on file  . Attends Archivist Meetings: Not on file  . Marital Status: Not on file       Review of Systems: Unable to obtain due to cognitive status  Observations/Objective: No distress. Flat affect on video.  Caretaker was able to adjust patient so abdomen may be seen. Non-distended, thin. Patient appears thin, unable to assess oral mucosa due to video quality.   Assessment and Plan: 49 year old female with history of Huntington's disease, with marked decline over the past few weeks to include difficulty in tolerating oral puree food, liquids, and worsening constipation. Approximately 2 weeks ago, patient was able to eat small bites of pureed food about 3 times per day, but currently she is holding food in her mouth with spillage, choking, and likely aspirating. Liquids have to be taken with thick-it, and only small intake (few sips) are able with assistance. Constipation worsened in setting of decreased oral intake, liquids, dehydration, and overall functional decline due to progressive disease.  Discussed with mother and caretaker on video call their desires in management. Agree with home health that has been ordered by Dr. Moshe Cipro, including PT, speech, and evaluation for necessary supplies (suction, suction kits, etc). Both mother and caretaker desire to have hospice input prior to any feeding tube placement, which is appropriate. We discussed that if feeding tube were placed, it will not prolong life or change trajectory of illness, and we discussed risk of aspiration remains even with feedings through tube. She is already having difficulty with tolerating her secretions at times, requiring suction.  I anticipate she may need to be limited to comfort feeds at this time. If feeding tube is requested by family, surgery would need to perform this, as we do not place feeding tubes. Discussed briefly with Dr. Arnoldo Morale the concerns of caretaker regarding anesthesia; it would be possible to undergo this at [facility] with Propofol. However, at this point, feeding tube would prolong inevitable outcome and risks of placing could outweigh the benefits.   Difficult scenario, and both mother and caretaker are aware of the  progressive decline of Huntington's and overall poor prognosis. They desire to focus on comfort and quality of life, requesting hospice input prior to further recommendations.  Constipation: start suppositories as already recommended by PCP. As she is unable to  drink sufficient liquids, would limit fiber supplementation.    I have reached out to [facility] and spoken with Santiago Glad, [phone] to inquire about status of prior referral and request expedited evaluation of patient at home; she has received PCP referral and needs recent face-to-face/video encounter in order to proceed, and she is able to use this visit to get the process rolling.   Further recommendations to follow after family meets with hospice.   Follow Up Instructions:    I discussed the assessment and treatment plan with the patient. The patient was provided an opportunity to ask questions and all were answered. The patient agreed with the plan and demonstrated an understanding of the instructions.   The patient was advised to call back or seek an in-person evaluation if the symptoms worsen or if the condition fails to improve as anticipated.  I provided 40 minutes of virtual face-to-face time during this encounter.  Annitta Needs, PhD, ANP-BC [facility]

## 2019-07-01 NOTE — Telephone Encounter (Signed)
I signed referral

## 2019-07-01 NOTE — Telephone Encounter (Signed)
Spoke with Mardene Celeste (mom) and she stressed that she just wanted HELP.Marland KitchenMarland KitchenI advised the mom that we could go with [facility] or [facility], it was her choice.  She said she would give [facility] another try, she only wants assistance. I advised Mom that we revceived orders from [facility] from [place], I called and spoke with [facility] and addressed the concerns, that the mom had, to listen to her and assist her daughter Hanifah.  Misty said a nurse would be calling the [facility] this afternoon.  I called Mom back to advised

## 2019-07-02 ENCOUNTER — Telehealth: Payer: Self-pay

## 2019-07-02 ENCOUNTER — Ambulatory Visit (INDEPENDENT_AMBULATORY_CARE_PROVIDER_SITE_OTHER): Payer: Medicare Other | Admitting: Gastroenterology

## 2019-07-02 ENCOUNTER — Encounter: Payer: Self-pay | Admitting: Gastroenterology

## 2019-07-02 ENCOUNTER — Other Ambulatory Visit: Payer: Self-pay | Admitting: Gastroenterology

## 2019-07-02 ENCOUNTER — Other Ambulatory Visit: Payer: Self-pay

## 2019-07-02 DIAGNOSIS — G10 Huntington's disease: Secondary | ICD-10-CM

## 2019-07-02 DIAGNOSIS — G839 Paralytic syndrome, unspecified: Secondary | ICD-10-CM | POA: Diagnosis not present

## 2019-07-02 DIAGNOSIS — M6281 Muscle weakness (generalized): Secondary | ICD-10-CM | POA: Diagnosis not present

## 2019-07-02 MED ORDER — UNABLE TO FIND: 0 refills | Status: AC

## 2019-07-02 MED ORDER — UNABLE TO FIND: 0 refills | Status: DC

## 2019-07-02 MED ORDER — BISACODYL 10 MG RE SUPP: 10.0000 mg | Freq: Every day | RECTAL | 0 refills | Status: DC | PRN

## 2019-07-02 NOTE — Telephone Encounter (Signed)
Referral entered  

## 2019-07-02 NOTE — Telephone Encounter (Signed)
Had a GI visit today with [facility] and Hospice is recommended and PCP is responsible for referring patient. [facility] called to relay that information and said that patients family is expecting call

## 2019-07-02 NOTE — Telephone Encounter (Signed)
I spoke with Mother and she is in full agreement with hospice involvement following gI recommendation at todauy's visit. Please refer, dx malnutrition,huntington's disease, I will sig

## 2019-07-02 NOTE — Patient Instructions (Signed)
We are recommending home health for assessment.   Feeding tube is able to be placed here at [facility] by one of the surgeons, if this is the route you are wanting to take.  Limit fiber for now as you are unable to get in adequate oral fluids.    It was a pleasure to see you today. I want to create trusting relationships with patients to provide genuine, compassionate, and quality care. I value your feedback. If you receive a survey regarding your visit,  I greatly appreciate you taking time to fill this out.   Annitta Needs, PhD, ANP-BC [facility]

## 2019-07-02 NOTE — Telephone Encounter (Signed)
[facility] order entered and DME

## 2019-07-02 NOTE — Progress Notes (Signed)
CC'ED TO PCP 

## 2019-07-03 ENCOUNTER — Telehealth: Payer: Self-pay | Admitting: *Deleted

## 2019-07-03 ENCOUNTER — Other Ambulatory Visit: Payer: Self-pay | Admitting: *Deleted

## 2019-07-03 DIAGNOSIS — F419 Anxiety disorder, unspecified: Secondary | ICD-10-CM | POA: Diagnosis not present

## 2019-07-03 DIAGNOSIS — M6281 Muscle weakness (generalized): Secondary | ICD-10-CM | POA: Diagnosis not present

## 2019-07-03 DIAGNOSIS — Z515 Encounter for palliative care: Secondary | ICD-10-CM | POA: Diagnosis not present

## 2019-07-03 DIAGNOSIS — F0390 Unspecified dementia without behavioral disturbance: Secondary | ICD-10-CM | POA: Diagnosis not present

## 2019-07-03 DIAGNOSIS — R1310 Dysphagia, unspecified: Secondary | ICD-10-CM | POA: Diagnosis not present

## 2019-07-03 DIAGNOSIS — G10 Huntington's disease: Secondary | ICD-10-CM | POA: Diagnosis not present

## 2019-07-03 DIAGNOSIS — E119 Type 2 diabetes mellitus without complications: Secondary | ICD-10-CM | POA: Diagnosis not present

## 2019-07-03 DIAGNOSIS — I10 Essential (primary) hypertension: Secondary | ICD-10-CM | POA: Diagnosis not present

## 2019-07-03 NOTE — Patient Outreach (Signed)
Referral received MD office, pt with diagnoses advanced Huntington's disease, urinary incontinence, depression due to dementia, dysphagia, falls/ muscle weakness, constipation, patient's caregiver/ mother is having difficulty managing feedings with thickit due to difficulty getting correct texture and pt gets choked.  Notes in EMR state pt is to have [facility] home health PT, ST and DME ordered, pt saw GI doctor 07/02/19, and pt may be going on hospice services.  Outreach call top [phone] for screening, no answer to telephone, left voicemail requesting return phone call, [phone] someone picked up the phone but did not speak and call ended.  RN CM mailed unsuccessful outreach letter to pt home.  PLAN Outreach pt in 3-4 business days  Jacqlyn Larsen [facility], [place] [phone]

## 2019-07-03 NOTE — Telephone Encounter (Signed)
Eric with [facility] was reaching out to inform Dr Moshe Cipro that Uniquia was admitted to hospice.

## 2019-07-03 NOTE — Telephone Encounter (Signed)
NOTED

## 2019-07-03 NOTE — Telephone Encounter (Signed)
FYI

## 2019-07-04 ENCOUNTER — Telehealth: Payer: Self-pay | Admitting: *Deleted

## 2019-07-04 DIAGNOSIS — F419 Anxiety disorder, unspecified: Secondary | ICD-10-CM | POA: Diagnosis not present

## 2019-07-04 DIAGNOSIS — E119 Type 2 diabetes mellitus without complications: Secondary | ICD-10-CM | POA: Diagnosis not present

## 2019-07-04 DIAGNOSIS — M6281 Muscle weakness (generalized): Secondary | ICD-10-CM | POA: Diagnosis not present

## 2019-07-04 DIAGNOSIS — F0390 Unspecified dementia without behavioral disturbance: Secondary | ICD-10-CM | POA: Diagnosis not present

## 2019-07-04 DIAGNOSIS — I10 Essential (primary) hypertension: Secondary | ICD-10-CM | POA: Diagnosis not present

## 2019-07-04 DIAGNOSIS — G10 Huntington's disease: Secondary | ICD-10-CM | POA: Diagnosis not present

## 2019-07-04 NOTE — Telephone Encounter (Signed)
Moreen Fowler (aunt) called in and wanted [place] to call pt's mom Cherene Julian) when we get a chance.  She said that Vicente Males was going to discuss decision for feeding tube placement with a doctor.  Called pt's mom and informed her that Vicente Males spoke with Dr. Arnoldo Morale since we do not place feeding tubes.  Per ov note, Dr. Arnoldo Morale suggests that feeding tube would prolong inevitable outcome and risks of placing could outweigh the benefits.  Mom voiced understanding.

## 2019-07-05 DIAGNOSIS — G10 Huntington's disease: Secondary | ICD-10-CM | POA: Diagnosis not present

## 2019-07-05 DIAGNOSIS — E119 Type 2 diabetes mellitus without complications: Secondary | ICD-10-CM | POA: Diagnosis not present

## 2019-07-05 DIAGNOSIS — I10 Essential (primary) hypertension: Secondary | ICD-10-CM | POA: Diagnosis not present

## 2019-07-05 DIAGNOSIS — M6281 Muscle weakness (generalized): Secondary | ICD-10-CM | POA: Diagnosis not present

## 2019-07-05 DIAGNOSIS — F419 Anxiety disorder, unspecified: Secondary | ICD-10-CM | POA: Diagnosis not present

## 2019-07-05 DIAGNOSIS — F0390 Unspecified dementia without behavioral disturbance: Secondary | ICD-10-CM | POA: Diagnosis not present

## 2019-07-05 NOTE — Telephone Encounter (Signed)
I wanted to clarify this note:  I had spoken to Dr. Arnoldo Morale and PEG tube can technically be placed with Propofol as anesthesia of choice. However, I had spoken with family and reiterated that the risk of aspiration remains, the trajectory of progressive decline would not be changed in light of incurable disease. After review of all information, family has decided to pursue hospice. I have asked family to call if condition changes or any questions.

## 2019-07-08 ENCOUNTER — Other Ambulatory Visit: Payer: Self-pay | Admitting: *Deleted

## 2019-07-08 DIAGNOSIS — M6281 Muscle weakness (generalized): Secondary | ICD-10-CM | POA: Diagnosis not present

## 2019-07-08 DIAGNOSIS — G10 Huntington's disease: Secondary | ICD-10-CM | POA: Diagnosis not present

## 2019-07-08 DIAGNOSIS — I10 Essential (primary) hypertension: Secondary | ICD-10-CM | POA: Diagnosis not present

## 2019-07-08 DIAGNOSIS — F0390 Unspecified dementia without behavioral disturbance: Secondary | ICD-10-CM | POA: Diagnosis not present

## 2019-07-08 DIAGNOSIS — F419 Anxiety disorder, unspecified: Secondary | ICD-10-CM | POA: Diagnosis not present

## 2019-07-08 DIAGNOSIS — E119 Type 2 diabetes mellitus without complications: Secondary | ICD-10-CM | POA: Diagnosis not present

## 2019-07-08 NOTE — Patient Outreach (Signed)
Outreach call to pt for screening/ 2nd attempt, no answer to telephone. Per EMR, pt is to transition to hospice per family wishes.   PLAN Outreach patient's mother in 3-4 business days Follow up if pt is active with hospice  Jacqlyn Larsen [facility], [place] Coordinator [phone]

## 2019-07-09 ENCOUNTER — Ambulatory Visit: Payer: Medicare Other | Admitting: Nurse Practitioner

## 2019-07-09 DIAGNOSIS — F419 Anxiety disorder, unspecified: Secondary | ICD-10-CM | POA: Diagnosis not present

## 2019-07-09 DIAGNOSIS — I10 Essential (primary) hypertension: Secondary | ICD-10-CM | POA: Diagnosis not present

## 2019-07-09 DIAGNOSIS — M6281 Muscle weakness (generalized): Secondary | ICD-10-CM | POA: Diagnosis not present

## 2019-07-09 DIAGNOSIS — F0390 Unspecified dementia without behavioral disturbance: Secondary | ICD-10-CM | POA: Diagnosis not present

## 2019-07-09 DIAGNOSIS — G10 Huntington's disease: Secondary | ICD-10-CM | POA: Diagnosis not present

## 2019-07-09 DIAGNOSIS — E119 Type 2 diabetes mellitus without complications: Secondary | ICD-10-CM | POA: Diagnosis not present

## 2019-07-11 DIAGNOSIS — E119 Type 2 diabetes mellitus without complications: Secondary | ICD-10-CM | POA: Diagnosis not present

## 2019-07-11 DIAGNOSIS — I10 Essential (primary) hypertension: Secondary | ICD-10-CM | POA: Diagnosis not present

## 2019-07-11 DIAGNOSIS — G10 Huntington's disease: Secondary | ICD-10-CM | POA: Diagnosis not present

## 2019-07-11 DIAGNOSIS — F419 Anxiety disorder, unspecified: Secondary | ICD-10-CM | POA: Diagnosis not present

## 2019-07-11 DIAGNOSIS — F0390 Unspecified dementia without behavioral disturbance: Secondary | ICD-10-CM | POA: Diagnosis not present

## 2019-07-11 DIAGNOSIS — M6281 Muscle weakness (generalized): Secondary | ICD-10-CM | POA: Diagnosis not present

## 2019-07-12 ENCOUNTER — Other Ambulatory Visit: Payer: Self-pay | Admitting: *Deleted

## 2019-07-12 DIAGNOSIS — E119 Type 2 diabetes mellitus without complications: Secondary | ICD-10-CM | POA: Diagnosis not present

## 2019-07-12 DIAGNOSIS — R404 Transient alteration of awareness: Secondary | ICD-10-CM | POA: Diagnosis not present

## 2019-07-12 DIAGNOSIS — F0390 Unspecified dementia without behavioral disturbance: Secondary | ICD-10-CM | POA: Diagnosis not present

## 2019-07-12 DIAGNOSIS — M6281 Muscle weakness (generalized): Secondary | ICD-10-CM | POA: Diagnosis not present

## 2019-07-12 DIAGNOSIS — F419 Anxiety disorder, unspecified: Secondary | ICD-10-CM | POA: Diagnosis not present

## 2019-07-12 DIAGNOSIS — G10 Huntington's disease: Secondary | ICD-10-CM | POA: Diagnosis not present

## 2019-07-12 DIAGNOSIS — I10 Essential (primary) hypertension: Secondary | ICD-10-CM | POA: Diagnosis not present

## 2019-07-15 ENCOUNTER — Telehealth: Payer: Self-pay | Admitting: *Deleted

## 2019-07-15 NOTE — Telephone Encounter (Signed)
Eric with [facility] called to let us know pt passed away Friday 07/13/2019

## 2019-07-15 NOTE — Telephone Encounter (Signed)
Family sent a card with our condolences

## 2019-07-15 NOTE — Telephone Encounter (Signed)
I tried  calling to speak with   the Mother to express condolence and had to leave a message, please send a card on our behalf, thanks

## 2019-07-22 ENCOUNTER — Ambulatory Visit: Payer: Medicare Other | Admitting: Family Medicine

## 2019-07-22 DIAGNOSIS — 419620001 Death: Secondary | SNOMED CT | POA: Diagnosis not present

## 2019-07-22 NOTE — Patient Outreach (Signed)
RN CM continues to be unable to reach pt/ family, RN CM spoke with Star, home health nurse from [facility] who reports pt is now active with hospice and home health has discharged, this is as of last week.  RN CM faxed case closure letter to primary care provider.  Case closed  Jacqlyn Larsen [facility], [place] Coordinator [phone]

## 2019-07-22 DEATH — deceased

## 2020-03-10 ENCOUNTER — Encounter: Payer: Medicare Other | Admitting: Family Medicine

## 2021-02-18 IMAGING — DX DG CHEST 1V PORT
1 series · 1 of 1 positions shown · non-contrast
Comparison: 06/24/2017

CLINICAL DATA: Cough, possible aspiration

EXAM:
PORTABLE CHEST 1 VIEW

[chest ap]
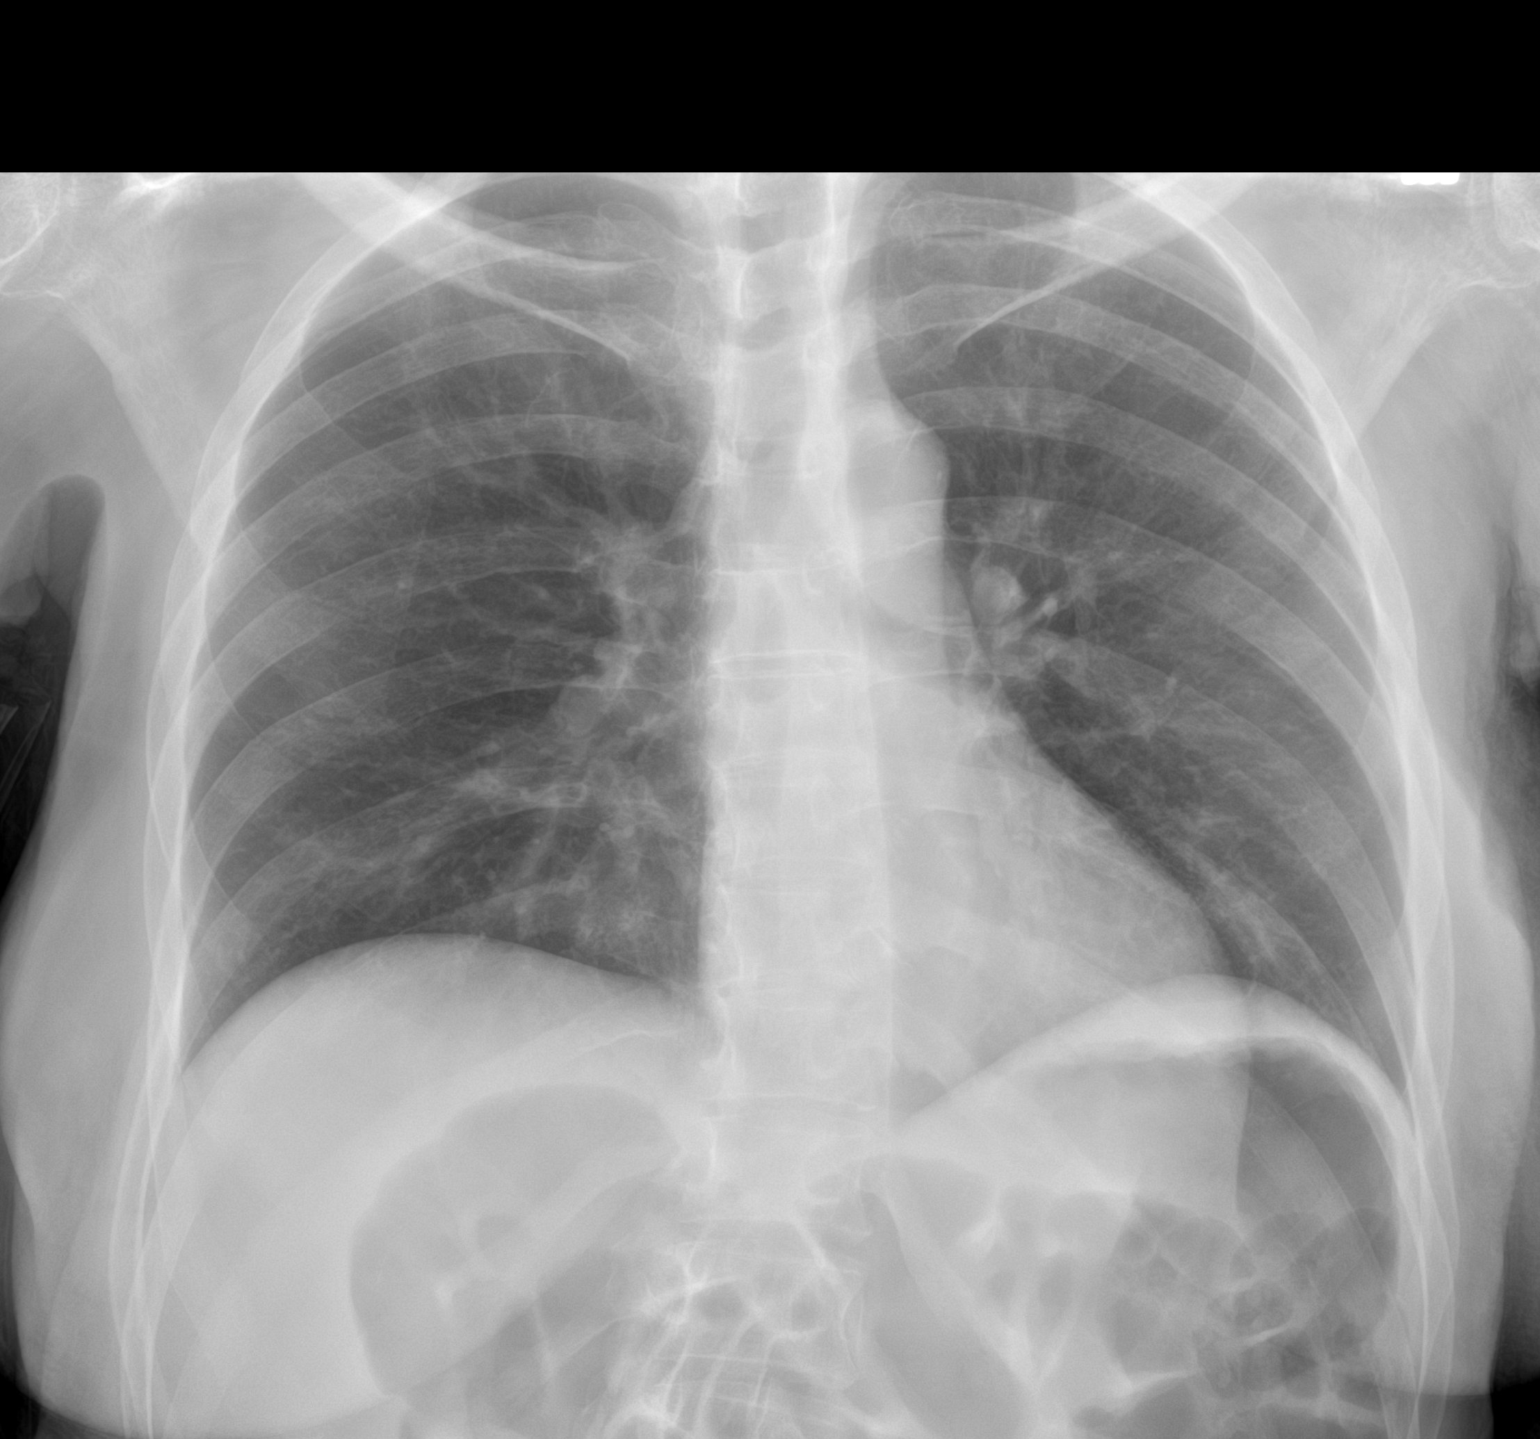

[1 of 1 positions shown; findings below may reference images not displayed]

FINDINGS: The lungs are clear. There is no pleural effusion or pneumothorax.
Cardiomediastinal contours are within normal limits with normal
heart size.
IMPRESSION: No acute process in the chest.
